# Patient Record
Sex: Male | Born: 1945 | Race: Black or African American | Hispanic: No | Marital: Married | Smoking: Never smoker
Health system: Southern US, Community
[De-identification: ages and names within clinical notes are randomized; demographics above are authoritative.]

## PROBLEM LIST (undated history)

## (undated) DIAGNOSIS — Q2730 Arteriovenous malformation, site unspecified: Secondary | ICD-10-CM

## (undated) DIAGNOSIS — G919 Hydrocephalus, unspecified: Secondary | ICD-10-CM

## (undated) DIAGNOSIS — F0390 Unspecified dementia without behavioral disturbance: Secondary | ICD-10-CM

## (undated) DIAGNOSIS — I10 Essential (primary) hypertension: Secondary | ICD-10-CM

## (undated) DIAGNOSIS — I729 Aneurysm of unspecified site: Secondary | ICD-10-CM

## (undated) HISTORY — PX: VENTRICULOPERITONEAL SHUNT: SHX204

## (undated) HISTORY — DX: Arteriovenous malformation, site unspecified: Q27.30

## (undated) HISTORY — DX: Aneurysm of unspecified site: I72.9

## (undated) HISTORY — DX: Hydrocephalus, unspecified: G91.9

## (undated) HISTORY — DX: Unspecified dementia, unspecified severity, without behavioral disturbance, psychotic disturbance, mood disturbance, and anxiety: F03.90

## (undated) HISTORY — DX: Essential (primary) hypertension: I10

---

## 2004-12-22 ENCOUNTER — Inpatient Hospital Stay (HOSPITAL_COMMUNITY): Admission: EM | Admit: 2004-12-22 | Discharge: 2005-01-30 | Payer: Self-pay | Admitting: Emergency Medicine

## 2004-12-22 ENCOUNTER — Ambulatory Visit: Payer: Self-pay | Admitting: Physical Medicine & Rehabilitation

## 2004-12-31 ENCOUNTER — Ambulatory Visit: Payer: Self-pay | Admitting: Emergency Medicine

## 2005-01-30 ENCOUNTER — Ambulatory Visit: Payer: Self-pay | Admitting: Physical Medicine & Rehabilitation

## 2005-01-30 ENCOUNTER — Inpatient Hospital Stay (HOSPITAL_COMMUNITY)
Admission: RE | Admit: 2005-01-30 | Discharge: 2005-02-08 | Payer: Self-pay | Admitting: Physical Medicine & Rehabilitation

## 2005-02-20 ENCOUNTER — Encounter
Admission: RE | Admit: 2005-02-20 | Discharge: 2005-04-17 | Payer: Self-pay | Admitting: Physical Medicine & Rehabilitation

## 2005-02-22 ENCOUNTER — Ambulatory Visit (HOSPITAL_COMMUNITY): Admission: RE | Admit: 2005-02-22 | Discharge: 2005-02-22 | Payer: Self-pay | Admitting: Neurosurgery

## 2005-03-25 ENCOUNTER — Ambulatory Visit: Payer: Self-pay | Admitting: Physical Medicine & Rehabilitation

## 2005-03-25 ENCOUNTER — Encounter
Admission: RE | Admit: 2005-03-25 | Discharge: 2005-06-23 | Payer: Self-pay | Admitting: Physical Medicine & Rehabilitation

## 2006-07-29 ENCOUNTER — Emergency Department (HOSPITAL_COMMUNITY): Admission: EM | Admit: 2006-07-29 | Discharge: 2006-07-29 | Payer: Self-pay | Admitting: Emergency Medicine

## 2006-12-19 ENCOUNTER — Emergency Department (HOSPITAL_COMMUNITY): Admission: EM | Admit: 2006-12-19 | Discharge: 2006-12-19 | Payer: Self-pay | Admitting: Emergency Medicine

## 2007-01-01 ENCOUNTER — Ambulatory Visit (HOSPITAL_COMMUNITY): Admission: RE | Admit: 2007-01-01 | Discharge: 2007-01-01 | Payer: Self-pay | Admitting: Gastroenterology

## 2007-06-16 IMAGING — CT CT HEAD W/O CM
1 of 2 series · 13 of 30 positions shown, 17 images · IV contrast (agent unspecified)
Comparison: None.

CLINICAL DATA: Headache.  Episodes of unresponsiveness. 
 HEAD CT WITHOUT CONTRAST:
TECHNIQUE: Contiguous axial images were obtained from the base of the skull through the vertex according to standard protocol without contrast.

[Series 2: brain · axial · 0.47mm/px · z∈[+147,+268]mm · 13 of 28 slices shown, 17 images]
[im 2/28  brain]
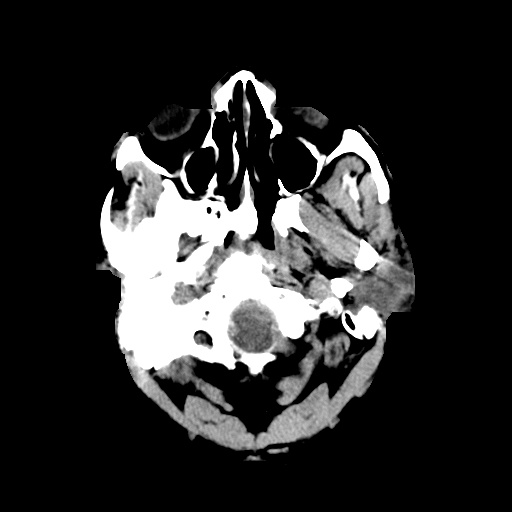
[im 2/28  bone]
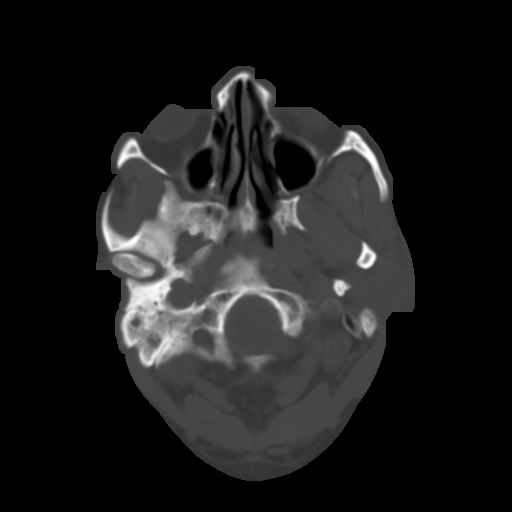
[im 4/28  brain]
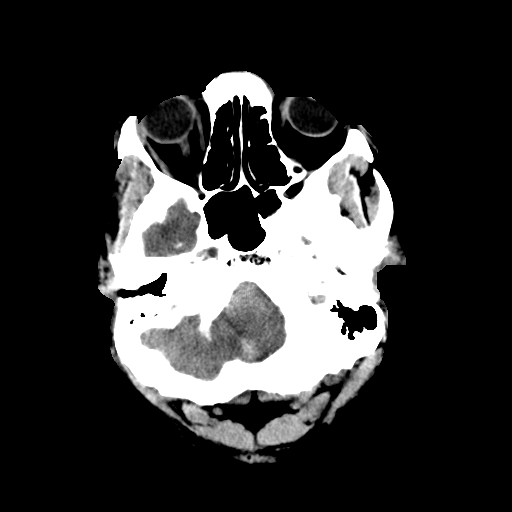
[im 6/28  brain]
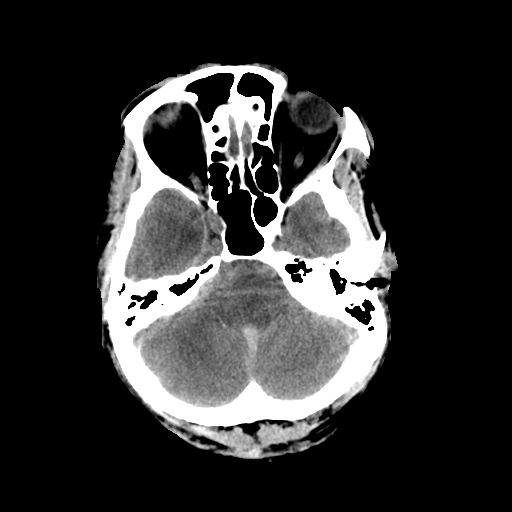
[im 8/28  brain]
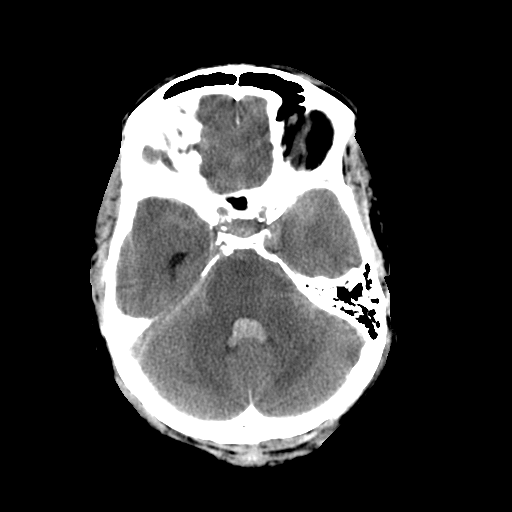
[im 10/28  brain]
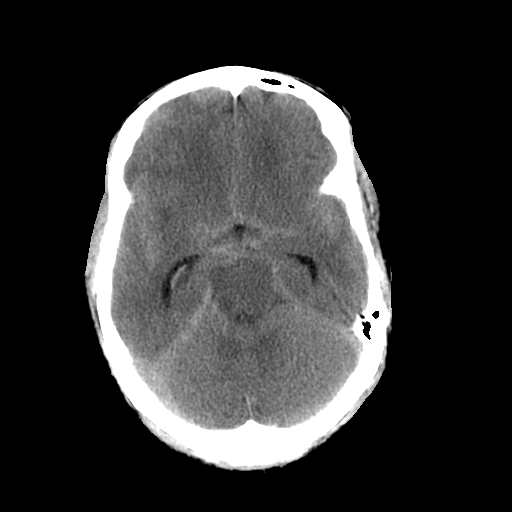
[im 10/28  bone]
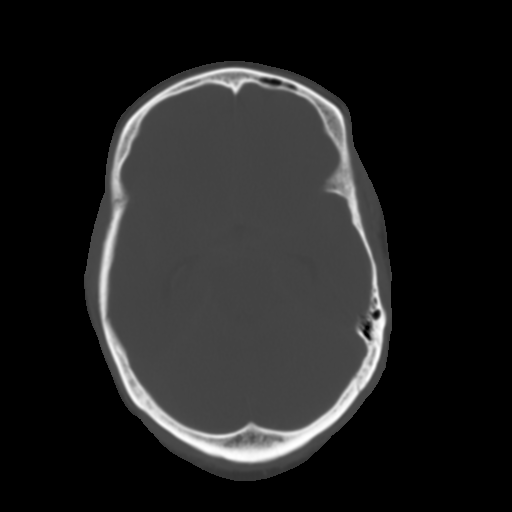
[im 12/28  brain]
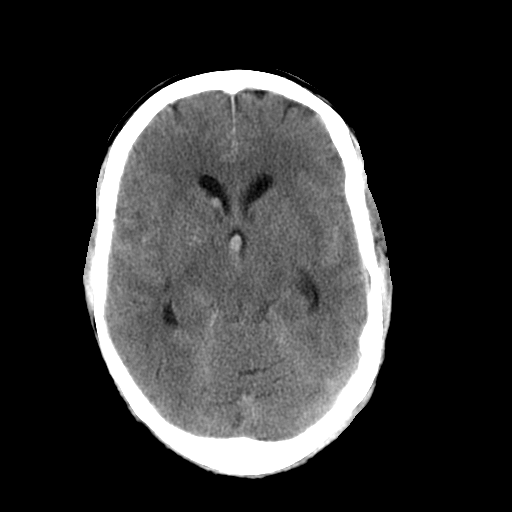
[im 14/28  brain]
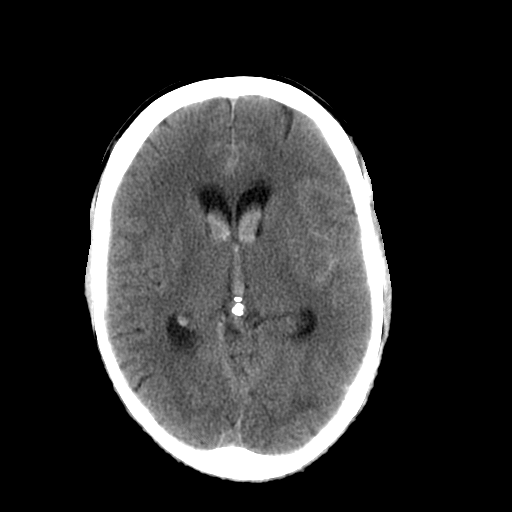
[im 16/28  brain]
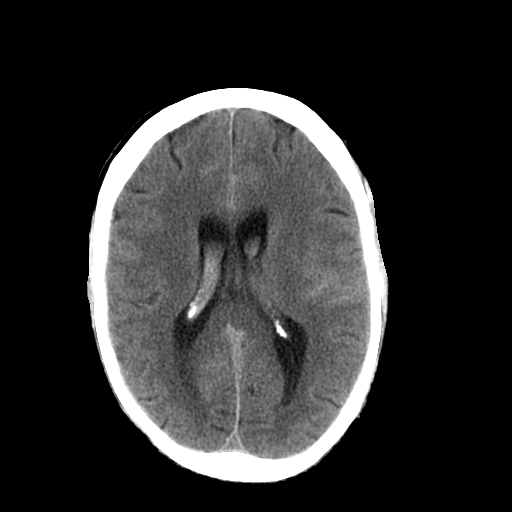
[im 18/28  brain]
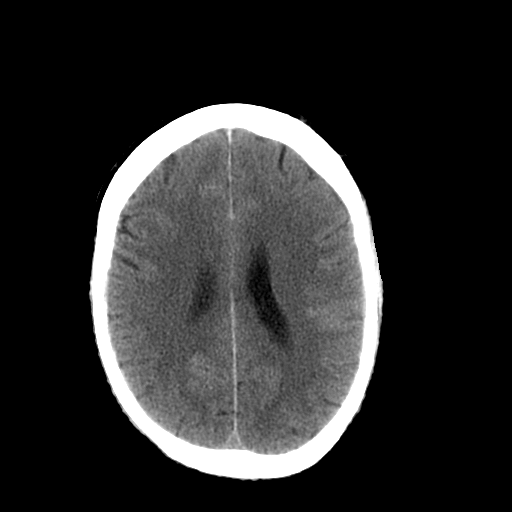
[im 18/28  bone]
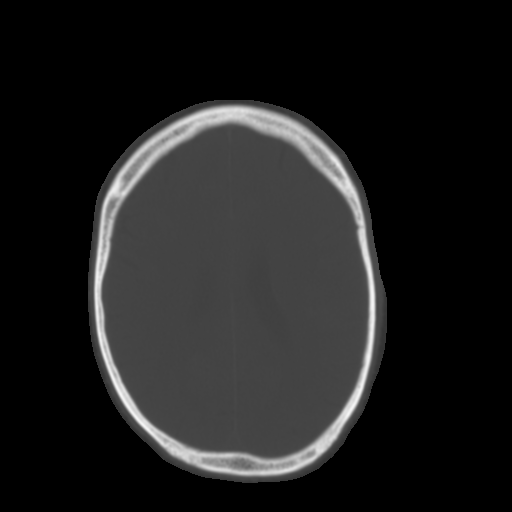
[im 20/28  brain]
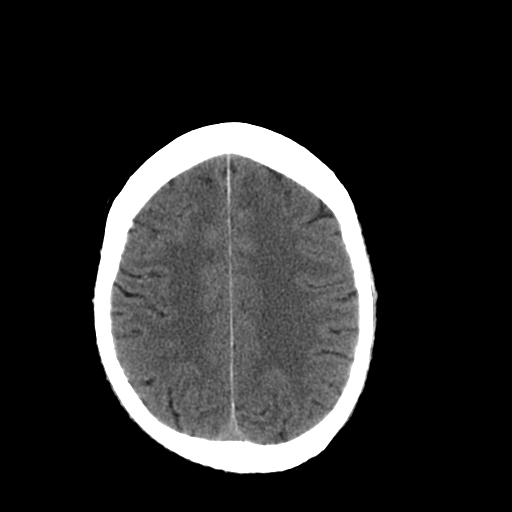
[im 22/28  brain]
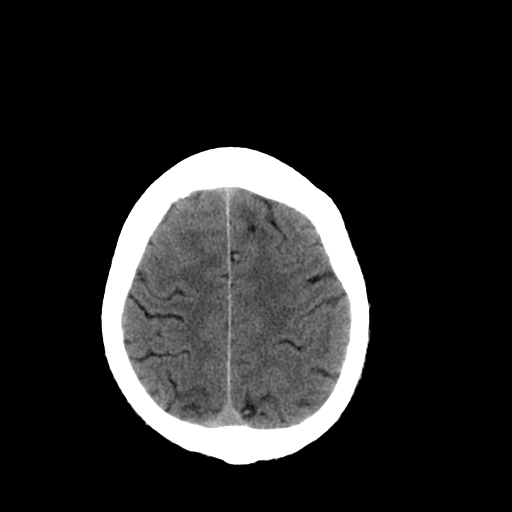
[im 24/28  brain]
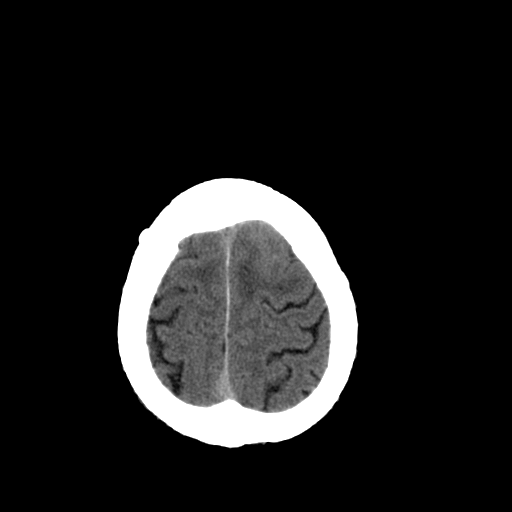
[im 26/28  brain]
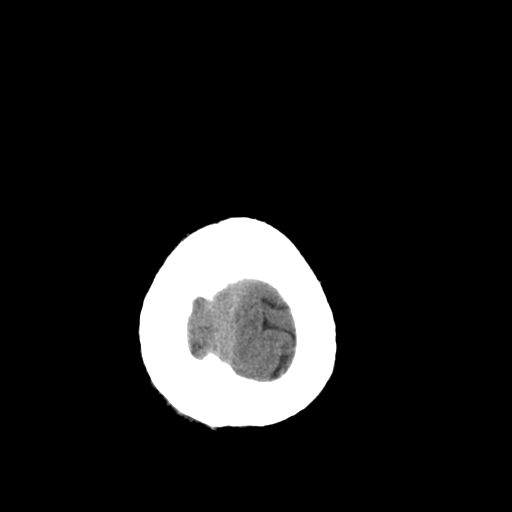
[im 26/28  bone]
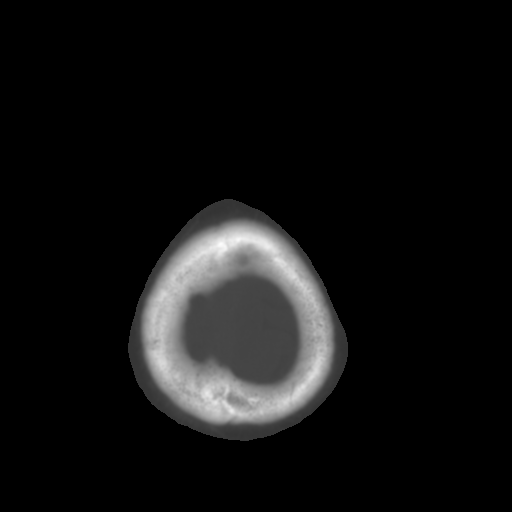

[13 of 30 positions shown; findings below may reference images not displayed]

FINDINGS: There is evidence of acute diffuse subarachnoid and intraventricular hemorrhage.  Subarachnoid blood is very slightly more prominent on the left compared to the right.  A large amount of focal blood is present in a distended fourth ventricle and there likely are early signs of developing obstructive hydrocephalus.  In addition mass effect is noted due to diffuse cerebral edema with obliteration of the suprasellar cistern and CSF spaces around the brainstem.
IMPRESSION: Acute subarachnoid hemorrhage with associated intraventricular blood and likely developing obstructive hydrocephalus.  There is associated cerebral edema and mass effect with obliteration of CSF spaces at the level of the suprasellar cistern and surrounding the brainstem.  Findings were discussed directly with Dr. Mykolas-Genius in the emergency department.

## 2007-06-17 IMAGING — CT CT HEAD W/O CM
1 of 2 series · 13 of 30 positions shown, 17 images · non-contrast
Comparison: 12/22/2004

CLINICAL DATA: Subarachnoid hemorrhage

HEAD CT WITHOUT CONTRAST
TECHNIQUE: 5mm collimated images were obtained from the base of the skull
through the vertex according to standard protocol without contrast.

[Series 2: brain · axial · 0.47mm/px · z∈[+107,+228]mm · 13 of 28 slices shown, 17 images]
[im 2/28  brain]
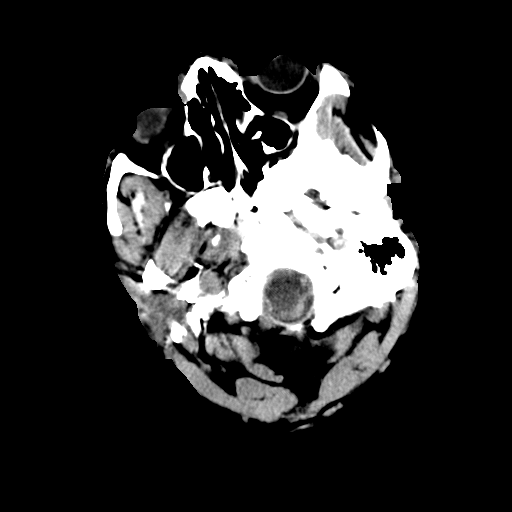
[im 2/28  bone]
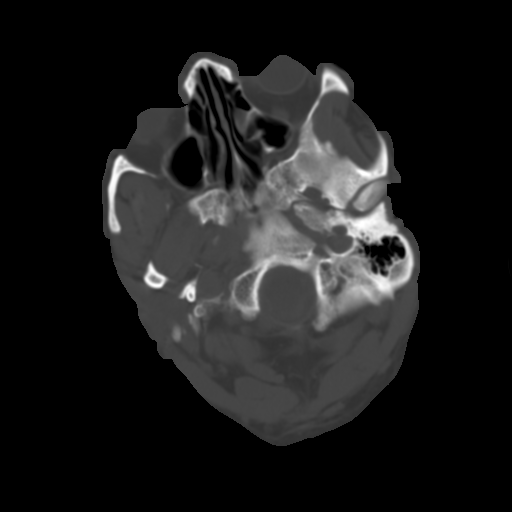
[im 4/28  brain]
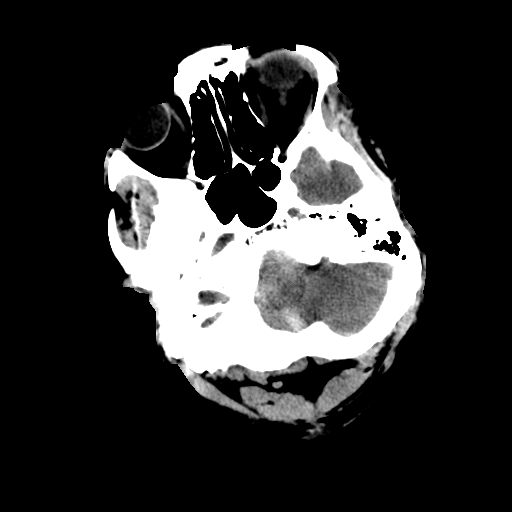
[im 6/28  brain]
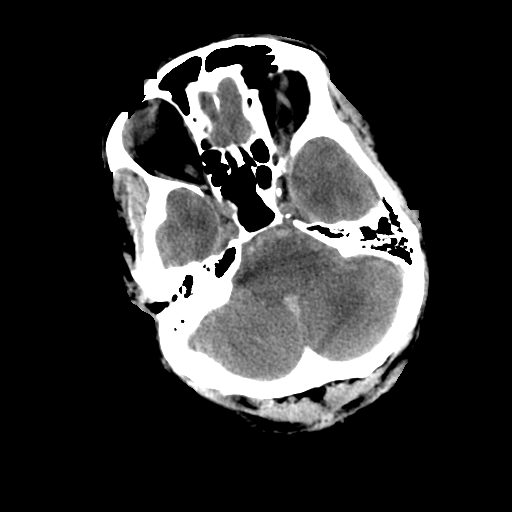
[im 8/28  brain]
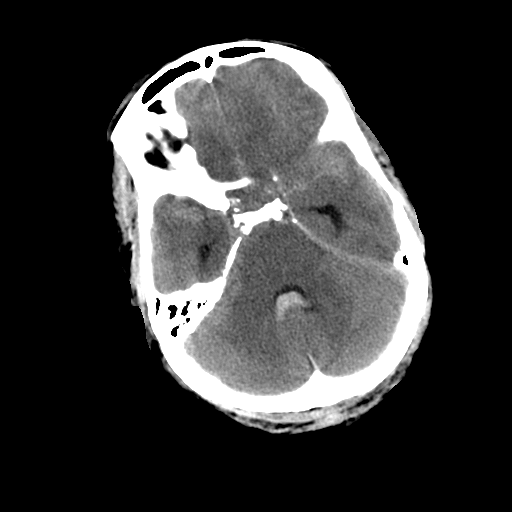
[im 10/28  brain]
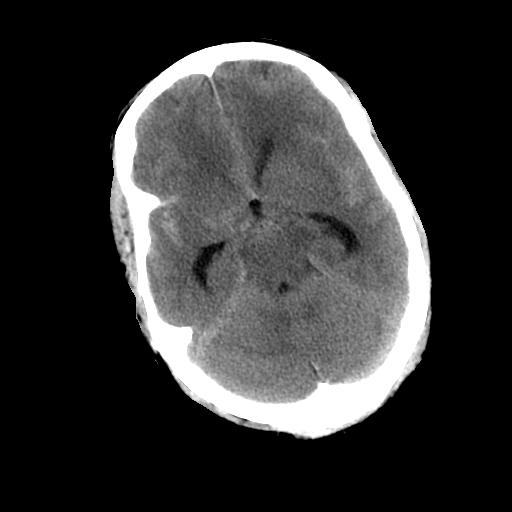
[im 10/28  bone]
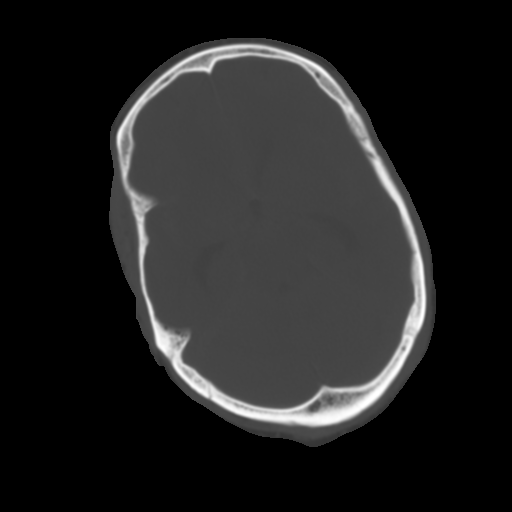
[im 12/28  brain]
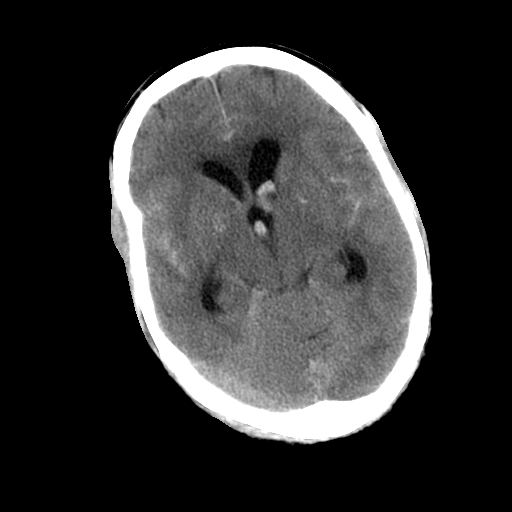
[im 14/28  brain]
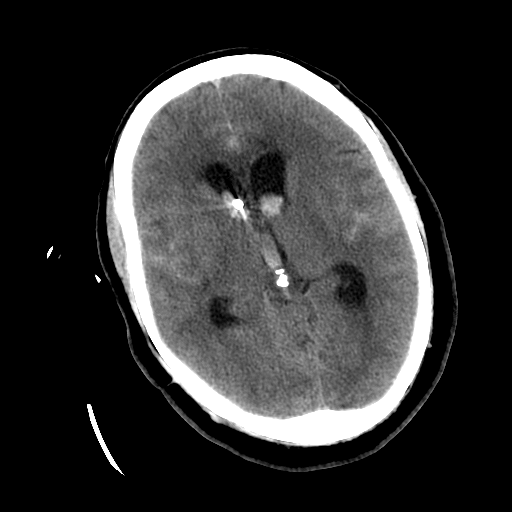
[im 16/28  brain]
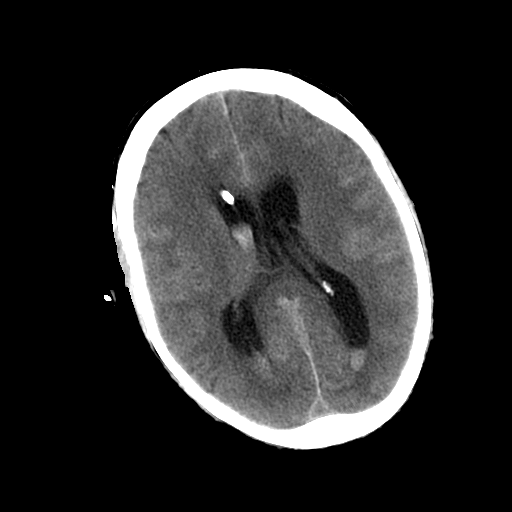
[im 18/28  brain]
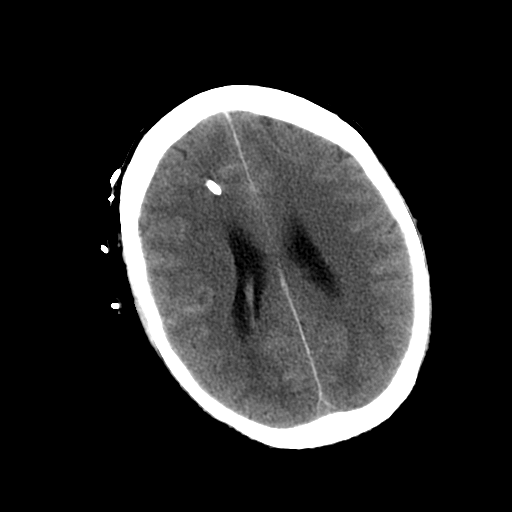
[im 18/28  bone]
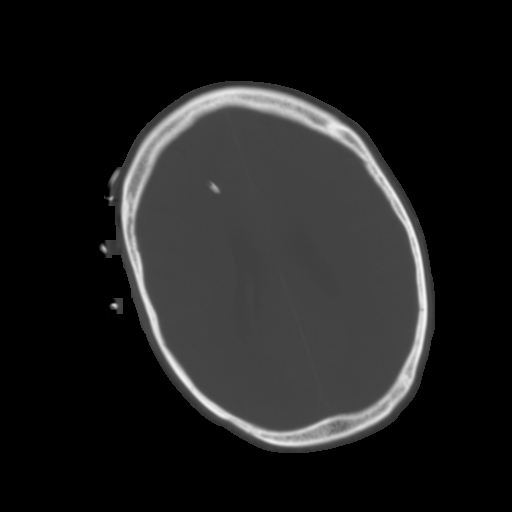
[im 20/28  brain]
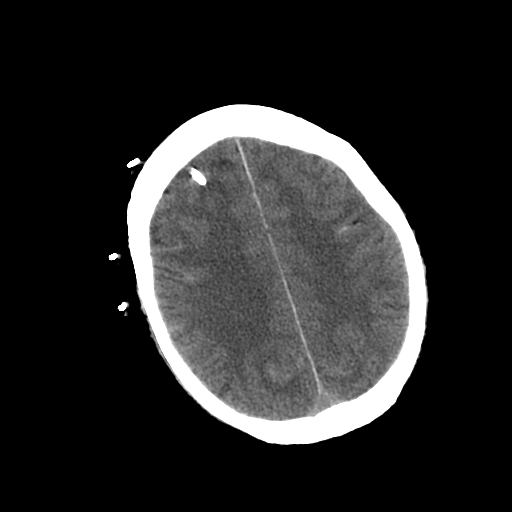
[im 22/28  brain]
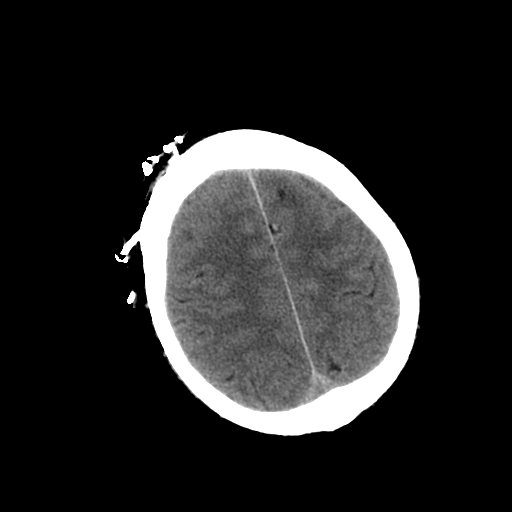
[im 24/28  brain]
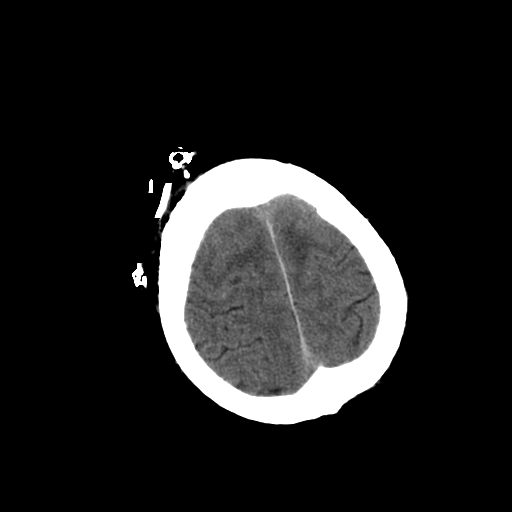
[im 26/28  brain]
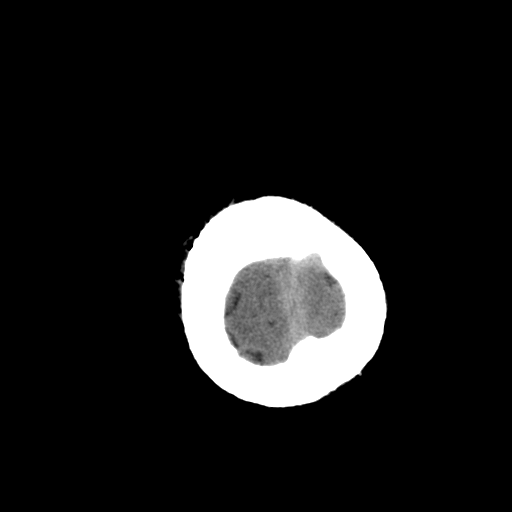
[im 26/28  bone]
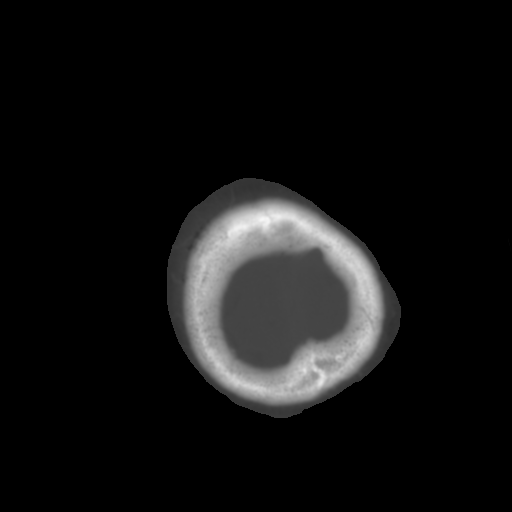

[13 of 30 positions shown; findings below may reference images not displayed]

FINDINGS: Again noted is diffuse subarachnoid hemorrhage and intraventricular
blood. Again, effacement of the basilar cisterns noted. Right frontal
ventricular drain has been placed. Ventricular size is stable to slightly
increased since prior study.

IMPRESSION

Stable diffuse subarachnoid blood and intraventricular blood.

Status post ventricular drain placement. Stable to slight increase in size of
the ventricles. Continued effacement of the basilar cisterns.

## 2007-06-24 IMAGING — CR DG CHEST 1V PORT
1 series · 1 of 1 positions shown · non-contrast
Comparison: 12/30/04.

CLINICAL DATA: 59-year-old with subconjunctival hemorrhage, nontraumatic. Follow-up changes. 
 PORTABLE CHEST - 1 VIEW:

[view not recorded]
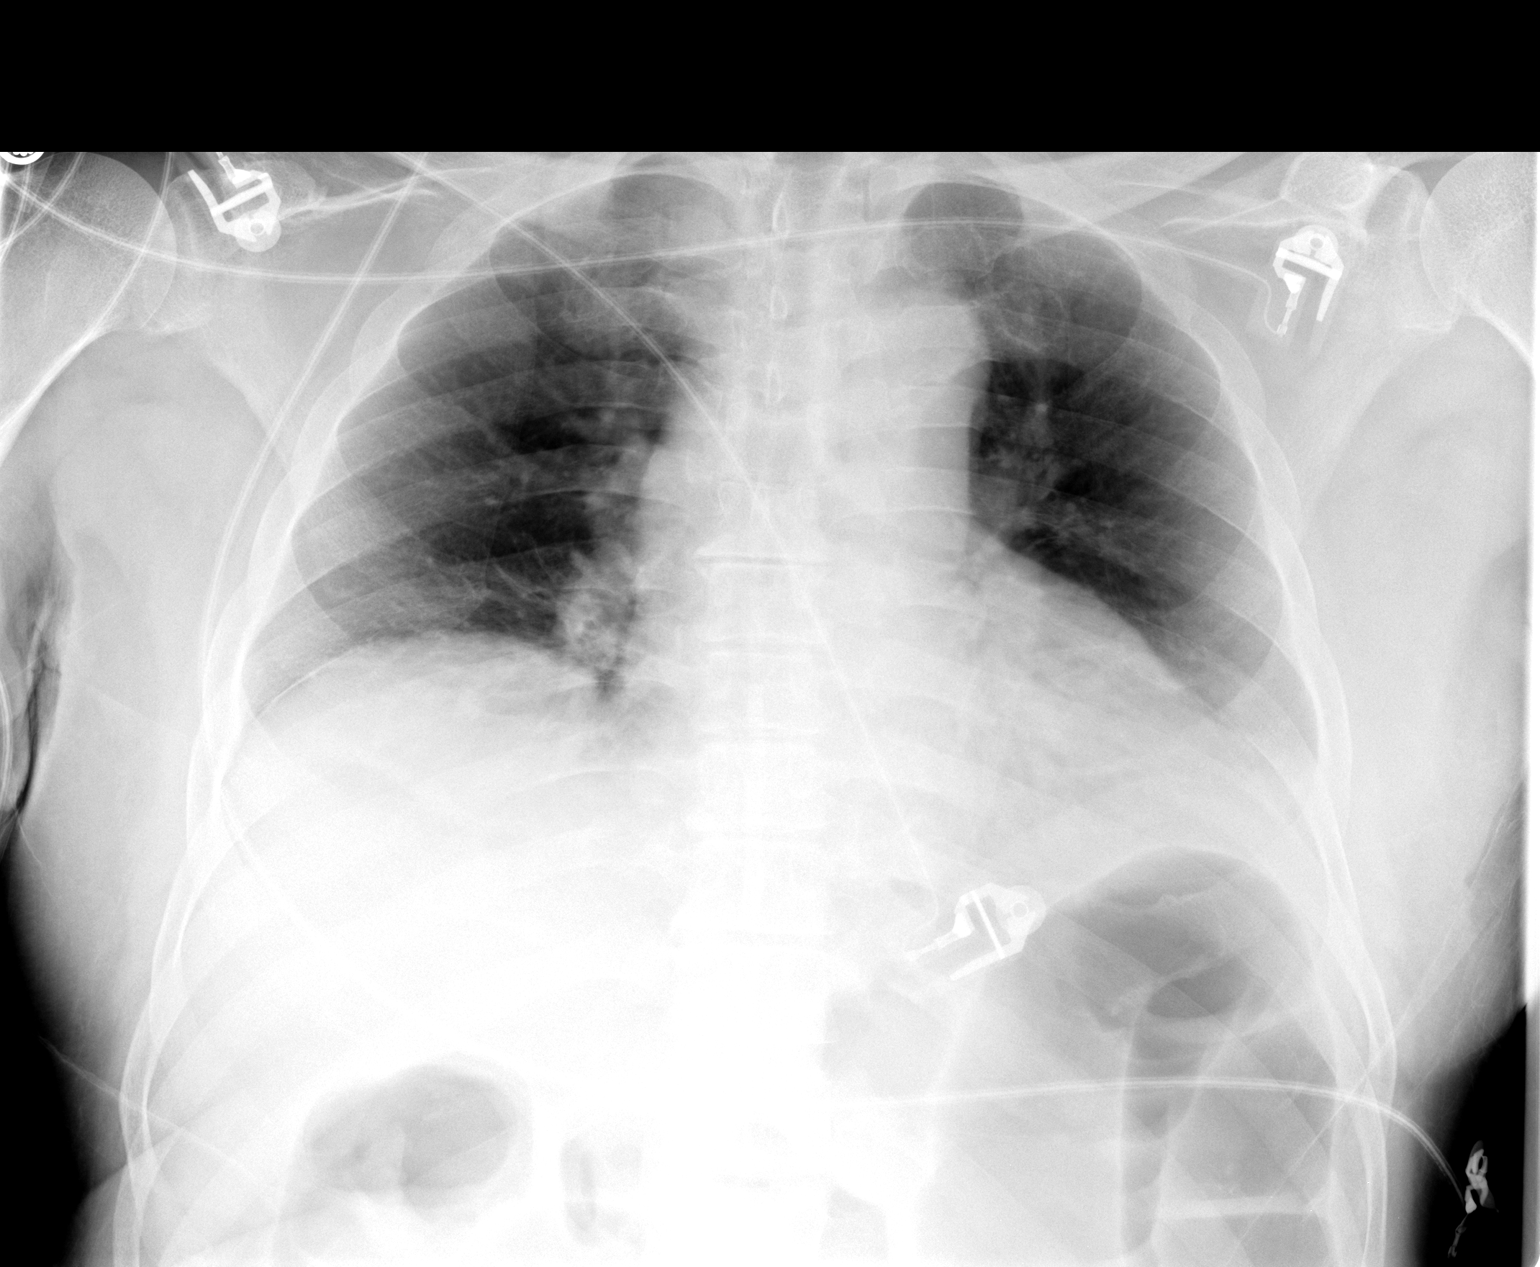

[1 of 1 positions shown; findings below may reference images not displayed]

FINDINGS: Film is made with shallow lung inflation.  However, there has been improvement in aeration of the left lower lobe.  No evidence for pleural effusion on the current study.  The heart is enlarged, but there is no pulmonary edema.
IMPRESSION: Improving aeration left lower lobe.

## 2010-02-21 ENCOUNTER — Encounter (INDEPENDENT_AMBULATORY_CARE_PROVIDER_SITE_OTHER): Payer: Self-pay | Admitting: Internal Medicine

## 2010-02-21 ENCOUNTER — Other Ambulatory Visit: Payer: Self-pay | Admitting: Internal Medicine

## 2010-02-21 ENCOUNTER — Observation Stay (HOSPITAL_COMMUNITY)
Admission: EM | Admit: 2010-02-21 | Discharge: 2010-02-22 | Payer: Self-pay | Source: Home / Self Care | Attending: Internal Medicine | Admitting: Internal Medicine

## 2010-02-21 LAB — TROPONIN I
Troponin I: 0.01 ng/mL (ref 0.00–0.06)
Troponin I: 0.01 ng/mL (ref 0.00–0.06)

## 2010-02-21 LAB — CBC
HCT: 43.4 % (ref 39.0–52.0)
Hemoglobin: 13.8 g/dL (ref 13.0–17.0)
MCH: 27.3 pg (ref 26.0–34.0)
MCHC: 31.8 g/dL (ref 30.0–36.0)
MCV: 85.9 fL (ref 78.0–100.0)
Platelets: 162 10*3/uL (ref 150–400)
RBC: 5.05 MIL/uL (ref 4.22–5.81)
RDW: 13.6 % (ref 11.5–15.5)
WBC: 5 10*3/uL (ref 4.0–10.5)

## 2010-02-21 LAB — URINALYSIS, ROUTINE W REFLEX MICROSCOPIC
Bilirubin Urine: NEGATIVE
Hemoglobin, Urine: NEGATIVE
Ketones, ur: NEGATIVE mg/dL
Nitrite: NEGATIVE
Protein, ur: NEGATIVE mg/dL
Specific Gravity, Urine: 1.015 (ref 1.005–1.030)
Urine Glucose, Fasting: NEGATIVE mg/dL
Urobilinogen, UA: 0.2 mg/dL (ref 0.0–1.0)
pH: 6 (ref 5.0–8.0)

## 2010-02-21 LAB — POCT I-STAT, CHEM 8
BUN: 15 mg/dL (ref 6–23)
Calcium, Ion: 1.12 mmol/L (ref 1.12–1.32)
Chloride: 105 mEq/L (ref 96–112)
Creatinine, Ser: 1.3 mg/dL (ref 0.4–1.5)
Glucose, Bld: 113 mg/dL — ABNORMAL HIGH (ref 70–99)
HCT: 45 % (ref 39.0–52.0)
Hemoglobin: 15.3 g/dL (ref 13.0–17.0)
Potassium: 4.6 mEq/L (ref 3.5–5.1)
Sodium: 141 mEq/L (ref 135–145)
TCO2: 30 mmol/L (ref 0–100)

## 2010-02-21 LAB — GLUCOSE, CAPILLARY: Glucose-Capillary: 89 mg/dL (ref 70–99)

## 2010-02-21 LAB — DIFFERENTIAL
Basophils Absolute: 0 10*3/uL (ref 0.0–0.1)
Basophils Relative: 0 % (ref 0–1)
Eosinophils Absolute: 0 10*3/uL (ref 0.0–0.7)
Eosinophils Relative: 1 % (ref 0–5)
Lymphocytes Relative: 27 % (ref 12–46)
Lymphs Abs: 1.4 10*3/uL (ref 0.7–4.0)
Monocytes Absolute: 0.5 10*3/uL (ref 0.1–1.0)
Monocytes Relative: 10 % (ref 3–12)
Neutro Abs: 3.1 10*3/uL (ref 1.7–7.7)
Neutrophils Relative %: 62 % (ref 43–77)

## 2010-02-21 LAB — CARDIAC PANEL(CRET KIN+CKTOT+MB+TROPI)
CK, MB: 0.9 ng/mL (ref 0.3–4.0)
Relative Index: 0.6 (ref 0.0–2.5)
Total CK: 159 U/L (ref 7–232)
Troponin I: 0.02 ng/mL (ref 0.00–0.06)

## 2010-02-21 LAB — CK TOTAL AND CKMB (NOT AT ARMC)
CK, MB: 1.3 ng/mL (ref 0.3–4.0)
CK, MB: 1.1 ng/mL (ref 0.3–4.0)
Relative Index: 0.7 (ref 0.0–2.5)
Relative Index: 0.6 (ref 0.0–2.5)
Total CK: 184 U/L (ref 7–232)
Total CK: 178 U/L (ref 7–232)

## 2010-02-21 LAB — POCT CARDIAC MARKERS
CKMB, poc: <1 ng/mL — ABNORMAL LOW (ref 1.0–8.0)
Myoglobin, poc: 139 ng/mL (ref 12–200)
Troponin i, poc: <0.05 ng/mL (ref 0.00–0.09)

## 2010-02-22 LAB — BASIC METABOLIC PANEL
BUN: 14 mg/dL (ref 6–23)
CO2: 31 mEq/L (ref 19–32)
Calcium: 9.4 mg/dL (ref 8.4–10.5)
Chloride: 106 mEq/L (ref 96–112)
Creatinine, Ser: 1.15 mg/dL (ref 0.4–1.5)
GFR calc Af Amer: >60 mL/min (ref >60)
GFR calc non Af Amer: >60 mL/min (ref >60)
Glucose, Bld: 94 mg/dL (ref 70–99)
Potassium: 4.6 mEq/L (ref 3.5–5.1)
Sodium: 142 mEq/L (ref 135–145)

## 2010-02-22 LAB — LIPID PANEL
Cholesterol: 248 mg/dL — ABNORMAL HIGH (ref 0–200)
HDL: 61 mg/dL (ref >39)
LDL Cholesterol: 173 mg/dL — ABNORMAL HIGH (ref 0–99)
Total CHOL/HDL Ratio: 4.1 RATIO
Triglycerides: 70 mg/dL (ref <150)
VLDL: 14 mg/dL (ref 0–40)

## 2010-03-15 LAB — URINE CULTURE
Colony Count: NO GROWTH
Culture  Setup Time: 201201040856
Culture: NO GROWTH

## 2010-07-06 NOTE — Procedures (Signed)
EEG NUMBER:  [id number].   This is a 65 year old who has been hospitalized for the last month with  hydrocephalus and intracranial hemorrhage. He had a spell in the intensive  care unit of unresponsiveness, questions seizure. This a portable 17-channel  EEG with 1-channel devoted to EKG utilizing the [employer] 10/20 lead  placement system. The patient was described as being awake and asleep  clinically. Electrographically, he was asleep for about 80% of the study and  towards the very end woke up. While awake, the background consists of an  admixture of beta and theta activity with a little alpha seen.  The sleep  architecture was fairly normal with beta sleep spindles and some delta  activity without any clear interhemispheric asymmetry. While awake, no  interhemispheric asymmetry is identified either. No definite epileptiform  discharges were seen. Activation procedures were not performed. The EKG  monitor reveals a relatively regular rhythm a rate of 84 beats per minute.   CONCLUSION:  Abnormal EEG due to some mild slowing during the waking state.  However, the bulk of the recording, at least 80%, was fairly normal-  appearing sleep architecture. Clinical and imaging correlation is  recommended. No seizure activity was seen.      Catherine A. Orlin Hilding, M.D.  Electronically Signed     BMW:UXLK  D:  01/28/2005 18:57:48  T:  01/29/2005 11:38:06  Job #:  [id number]

## 2010-07-06 NOTE — Assessment & Plan Note (Signed)
MEDICAL RECORD NUMBER/>  [id number]   Jeffrey Huerta is back regarding his subarachnoid hemorrhage. He was discharged from  rehab on December 22. He has been home with his wife. He has been active and  independent at the household level. He still notes some problems with memory  and the wife states he is bit slow to process and organize at times. The  patient is retired and has no immediate plans to return to work. He would  like to drive. He does report some minimal pain in the shoulders with  overhead activities but otherwise denies any pain. He has had no problems  with balance. Appetite has been good. He has been sleeping well. Mood has  been good.   CURRENT MEDICATIONS:  1.  Enalapril 10 mg a day.  2.  Labetalol 200 mg daily.  3.  Urecholine 10 mg t.i.d.  4.  Protonix 40 mg a day.  5.  Flomax 0.4 mg q.h.s.   REVIEW OF SYSTEMS:  The patient reports no issues with his urinary function.  Other pertinent positives listed above. Full review is in the health and  history section of the chart.   SOCIAL HISTORY:  The patient lives with his wife, who is supportive.   PHYSICAL EXAMINATION:  Blood pressure is 147/80, pulse is 78, respiratory  rate is 16, saturating 98% in room air. The patient is pleasant, no acute  distress. He is alert and oriented x3. He needs some extra time for date and  place. He can recall recent events with extra time and cues. He seemed to  organize his thoughts well. He was able to follow multiple-step commands.  Cranial nerve exam was intact. Sensation was normal. Reflexes were 2+.  Coordination was good overall. Motor function was 5/5. Heart was regular  rate and rhythm, lungs were clear, abdomen soft and nontender. Mood was  pleasant.   ASSESSMENT:  1.  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  2.  Hypertension.   PLAN:  1.  The patient is doing very well at this point. I will continue with  exercise around the home. I asked the wife to work on his cognition with      daily exercises provided by speech therapy. He is to continue with      outpatient speech therapy to completion. Since there are no vocational      issues I will not schedule follow-up with me. He has done quite well to      this point. He will see surgery at [facility] regarding clipping      of his AVM.  2.  We can discontinue Urecholine and Flomax after a taper. He also may stop      his Protonix today.  3.  The patient was advised to continue with labetalol and enalapril for      blood pressure control. He should follow up with his family doctor for      ongoing management of his blood pressure. I did refill the blood      pressure      medications today.  4.  I will see the patient back on a p.r.n. basis.      Ranelle Oyster, M.D.  Electronically Signed     ZTS/MedQ  D:  03/25/2005 10:59:43  T:  03/25/2005 11:56:34  Job #:  [id number]   cc:   Fleet Contras, M.D.  Fax: [phone]

## 2010-07-06 NOTE — Discharge Summary (Signed)
NAME:  Jeffrey Huerta, Jeffrey Huerta NO.:  [id number]   MEDICAL RECORD NO.:  [id number]          PATIENT TYPE:  IPS   LOCATION:  4099                         FACILITY:  [facility]   PHYSICIAN:  Ranelle Oyster, M.D.DATE OF BIRTH:  1945-09-01   DATE OF ADMISSION:  01/30/2005  DATE OF DISCHARGE:  02/08/2005                                 DISCHARGE SUMMARY   DISCHARGE DIAGNOSES:  1.  Subarachnoid hemorrhage with obstructive hydrocephalus - AVM status post      right frontal ventriculostomy.  2.  Dysphagia.  3.  VDRF - resolved.  4.  Diarrhea - resolved.  5.  History of gout, hypertension, urinary retention.   HISTORY OF PRESENT ILLNESS:  This is a 65 year old male unremarkable past  history who was admitted 11/04 with sudden loss of consciousness noted  nausea and vomiting.  Crown CT scan with acute subarachnoid hemorrhage with  associated intraventricular blood and developing obstructive hydrocephalus.  Cerebral arteriogram 11/04 with high flow AVM posterior aspect, left  cerebellar hemisphere.  Neurosurgery Dr. Phoebe Perch underwent right frontal  ventriculostomy.   HOSPITAL COURSE:  Fever.  Placed on broad spectrum antibiotics.  VDRF  critical care medicine follow up.  He was extubated over time.  Follow-up  crown CT scan 12/05 stable.  Swallow study 12/08 placed on a mechanical soft  diet.  Bouts of diarrhea secondary to Panda tube feeds.  Noted urinary  retention, intermittent catheterizations every 6-8 hours with small voids.  Maintain on Flomax.  Need a waist belt and place for safety as patient was  impulsive.  Blood pressures monitored with Vasotec and labetalol.  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  Advise follow-up crown CT scan in one week.  Will need AVM addressed in the future, bouts of hypokalemia and follow up  labs.  Admitted for comprehensive rehab program.   PAST MEDICAL HISTORY:  See discharge diagnoses.   ALLERGIES:  None.   MEDICATIONS:  Prior to  admission none.   SOCIAL HISTORY:  Lives with his wife.  Wife works day shift.  Family can  provide 24/7 assistance.   REHABILITATION HOSPITAL COURSE:  Subarachnoid hemorrhage with obstructive  hydrocephalus, AVM.  He had underwent right frontal ventriculostomy.  He  would follow with Dr. Phoebe Perch, neurosurgery for consideration and repair of  AVM in the future with repeat MRI study.  His diet has thus been advanced to  regular and he was tolerating well.  He exhibited no signs of respiratory  distress.  Oxygen saturation greater than 90% on room air.  Blood pressures  monitored with diastolic's 84, 86 as he continued with his labetalol and  Vasotec.  He did have small intermittent voids.  He was still needing  intermittent catheterization at times. He was on Flomax 0.4 mg at bedtime,  Urecholine was added at a low dose 10 mg 3 x daily on 12/20 being monitored.  His latest scan was 160 ml.  Functionally he continued to progress in all  areas.  He was simple set up for activities of daily living needing standby  assistance  for the ambulation due to some decreased awareness. He had been  seen by Dr. Francene Castle of neuro psychology to address these issues.  This patient remained calm, pleasant cooperative. No overt signs of  emotional distress. His expression and receptive language was intact for  basic information, though processes, verbal with some perseveration to  verbal ideas.  He still needed cues to locate his room.  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   LATEST LABS:  Hemoglobin of 10.7, hematocrit 31.5, platelets 265,000, sodium  138, potassium 3.7, BUN 9, creatinine 0.9.   DISCHARGE MEDICATIONS:  At the time of dictation included:  1.  Urecholine 10 mg 3 x daily.  2.  Protonix 40 mg daily.  3.  Flomax 0.4 mg at bedtime.  4.  Labetalol 200 mg daily.  5.   Vasotec 10 mg daily.   ACTIVITY:  Assistance for safety.   DIET:  Regular.   FOLLOW UP:  The patient would follow up with Dr. Ranelle Oyster at the  Outpatient Rehab [facility] office.  Appointment to be made as well as with Dr.  Phoebe Perch, neurosurgery [phone] to address AVM in the future.      Mariam Dollar, P.A.      Ranelle Oyster, M.D.  Electronically Signed    DA/MEDQ  D:  02/07/2005  T:  02/09/2005  Job:  [id number]   cc:   Ranelle Oyster, M.D.  Fax: [phone]   Clydene Fake, M.D.  Fax: [phone]

## 2010-07-06 NOTE — Discharge Summary (Signed)
NAME:  Jeffrey Huerta, Jeffrey Huerta NO.:  [id number]   MEDICAL RECORD NO.:  [id number]           PATIENT TYPE:   LOCATION:                                 FACILITY:   PHYSICIAN:  Clydene Fake, M.D.       DATE OF BIRTH:   DATE OF ADMISSION:  12/22/2004  DATE OF DISCHARGE:  01/30/2005                                 DISCHARGE SUMMARY   DIAGNOSIS:  Subarachnoid hemorrhage with arteriovenous malformation and  aneurysms.   DISCHARGE DIAGNOSIS:  Subarachnoid hemorrhage with arteriovenous  malformations and aneurysms.   PROCEDURES:  Ventriculostomy, angiogram.   REASON FOR ADMISSION:  This is a 65 year old with some loss of  consciousness, was brought to the emergency room.  Was more arousable,  complained of headache, some nausea and vomiting.  A CT scan was done  showing subarachnoid hemorrhage with intraventricular hemorrhage and maybe  some early hydrocephalus.  The patient was admitted to ICU, set up for  cerebral angiogram.   HOSPITAL COURSE:  The patient was admitted to ICU.  Angiogram was done  showing an AVM left cerebellar hemisphere with associated feeding vessels,  aneurysms.  It is the cerebellar hemisphere and it is a vermis AVM, proximal  feeding vessels, aneurysms x4, with the PICA, PCA, and superior cerebellar  being feeding vessels.  On December 22, 2005, at 2:45 the patient had some  decreased level of consciousness and after discussing with the wife and  getting consent, a right frontal ventriculostomy was placed.  That helped  the patient, was arousable, and the next morning he was awake, alert,  oriented x2, was able to follow commands, no drift.  The patient continued  having ICU care, easily arousable, oriented x2, was able to follow commands,  move all four extremities, SV drainage continued.  We followed the TCDs  which remained all right.  He started having some respiratory distress and  pulmonary critical care was consulted for some concurrent care  with  increased pulmonary toilet, checked cultures and changed antibiotics.  We  tried to wean the ventriculostomy by increasing his height.  The patient did  not tolerate that well, the ventricle seemed to get a little bit bigger, and  we decreased it again, letting it drain well.  The patient did become a  little more lethargic and ended up with a decreased level of consciousness  and more pulmonary distress.  The patient was intubated on January 02, 2005.  It appeared his mental status change was due to fever, probably due  to pneumonia or sepsis.  Critical care was continued with the current care  with [place] through this.  By November 20 he was still draining CSF through his  ventriculostomy.  He was able to be aroused, follow commands, and was trying  to mouth words.  CT showed the blood was clearing.  The ventricles were  still slightly big but cisterns open, and we started trying to wean his  ventriculostomy again.  The patient was extubated.  We started some  swallowing assessments.  He passed a bedside  swallowing evaluation on  November 27.  We continued weaning the ventriculostomy.  On November 30 he  only had 70 mL out over 24 hours up to 12 cm in his ventriculostomy.  Was  more awake, following commands, oriented only x1 though.  CT showed only  slightly increased ventricles.  We continued our current course at this  point and worked on nutrition.  We had clamped the ventriculostomy, repeated  CT on December 5.  Repeat CT showed no change from when the ventriculostomy  was open, and the ventriculostomy was discontinued.  We worked on increasing  activity and got a full PT/OT/speech therapy consult, and started getting  him out of bed and moving.  Repeat CT on December 7 without the  ventriculostomy showed the ventricles were slightly diminished, there was  more fluid __________.  He was more alert and doing well and we transferred  him to the ACU at this point and rehab services  were consulted.  He  continued making some slow  progress and January 30, 2005, the patient was transferred to rehab  services for further care and we will follow the patient from there as an  outpatient.  He will need treatment for his AVM and the aneurysms down the  road, which we will set up as an outpatient.           ______________________________  Clydene Fake, M.D.     JRH/MEDQ  D:  06/25/2005  T:  06/26/2005  Job:  [id number]

## 2010-07-06 NOTE — H&P (Signed)
NAME:  Jeffrey Huerta, Jeffrey Huerta NO.:  [id number]   MEDICAL RECORD NO.:  [id number]          PATIENT TYPE:  IPS   LOCATION:  4027                         FACILITY:  [facility]   PHYSICIAN:  Ranelle Oyster, M.D.DATE OF BIRTH:  [birth year]   DATE OF ADMISSION:  01/30/2005  DATE OF DISCHARGE:                                HISTORY & PHYSICAL   CHIEF COMPLAINT:  Weakness and mental status changes.   This is a 65 year old African-American male admitted November 4 with sudden  loss of consciousness with associated nausea and vomiting prior. Head CT  revealed acute subarachnoid hemorrhage with intraventricular blood resulting  in obstructive hydrocephalus as well.  Further workup revealed an AVM in the  posterior aspect of the left cerebellar hemisphere.  Right frontal  ventriculostomy was placed per Dr. Phoebe Perch.  Course was complicated by fever  and respiratory failure.  The patient has continued deficits in regards to  his speech and cognition.  There are safety awareness issues as well.   The patient had a speech performed on December 8 which found the patient  should have a dysphagia 3 thin liquid diet.  Urinary retention was also  noted.  Waist belt was used for safety.  AVM has essentially be stable, but  the patient will need followup intervention in this regard.   REVIEW OF SYSTEMS:  Listed in detail in the written H&P.   PAST MEDICAL HISTORY:  Positive for gout.   FAMILY HISTORY:  Negative.   SOCIAL HISTORY:  The patient lives with his wife.  His wife works day shift.  The patient's family apparently can provide 24-hour assistance. 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   MEDICATIONS PRIOR TO ARRIVAL:  None.   ALLERGIES:  None.   LABORATORY DATA:  Hemoglobin 11.8, white count 8.5, platelets 299.  Sodium  135, potassium 4.6, BUN 16, creatinine 0.7.   PHYSICAL EXAMINATION:  VITAL SIGNS: Blood pressure  120/70, pulse 88,  respiratory rate 18, temperature 98.  GENERAL: The patient is generally pleasant, in no acute distress.  HEENT:  Pupils equal, round, and reactive to light and accommodation.  Ear,  nose, throat exams were unremarkable.  NECK: Supple without JVD, lymphadenopathy.  CHEST: Clear to auscultation bilaterally without wheezes, rales, or rhonchi.  HEART:  Regular rate and rhythm without murmurs, rubs, or gallops.  NEUROLOGIC: Cranial nerves II-XII appeared to be grossly intact.  He has  intact vision and scanning.  Reflexes were slightly increased although  unreliably and evidently so.  Sensation is grossly intact.  The patient's  judgment, orientation, and memory are difficult to assess due to his  language problems.  The patient had significant receptive and somewhat  expressive language deficits.  Confabulation is a prominent issue as well.  Motor function was 5/5.  The patient stood for me, took some steps, and had  some mild loss of truncal control but all in all was stable with hand-on  assist.  EXTREMITIES:  Showed no clubbing, cyanosis, or edema.  Calves were  nontender.   ASSESSMENT AND PLAN:  1.  Functional deficits secondary to subarachnoid hemorrhage with associated      obstructive hydrocephalus.  The patient is status post right frontal      ventriculostomy.  Intervention is planned with patient's arteriovenous      malformation in the future.  Comprehensive inpatient rehabilitation with      supervision to modified independent goals.  The patient continues to      have significant cognitive and language deficits. Prognosis is fair.  2.  Pain management with Tylenol p.r.n.  3.  Deep vein thrombosis prophylaxis with thigh-high TEDs and ambulation.  4.  Diarrhea: This has resolved.  Observe clinically.  5.  History of gout.  6.  Hypertension: Vasotec and Labetalol.  7.  Urinary retention: Check PVRs.  8.  Safety: Waist belt in chair and bed for now.       Ranelle Oyster, M.D.  Electronically Signed     ZTS/MEDQ  D:  01/30/2005  T:  01/30/2005  Job:  [id number]   cc:   Clydene Fake, M.D.  Fax: [phone]

## 2010-11-28 LAB — DIFFERENTIAL
Basophils Relative: 0
Lymphs Abs: 1.1
Monocytes Absolute: 0.6
Monocytes Relative: 9
Neutro Abs: 5.2

## 2010-11-28 LAB — POCT I-STAT CREATININE: Operator id: 270111

## 2010-11-28 LAB — POCT CARDIAC MARKERS
CKMB, poc: 1.1
Myoglobin, poc: 91.9

## 2010-11-28 LAB — I-STAT 8, (EC8 V) (CONVERTED LAB)
BUN: 13
Bicarbonate: 24.9 — ABNORMAL HIGH
Glucose, Bld: 92
TCO2: 26
pH, Ven: 7.402 — ABNORMAL HIGH

## 2010-11-28 LAB — CBC
Hemoglobin: 12.8 — ABNORMAL LOW
MCHC: 33.9
MCV: 79.2
RBC: 4.79
WBC: 6.9

## 2010-12-06 LAB — I-STAT 8, (EC8 V) (CONVERTED LAB)
Bicarbonate: 21.7
Glucose, Bld: 103 — ABNORMAL HIGH
HCT: 42
Hemoglobin: 14.3
Operator id: 279831
Potassium: 3.9
Sodium: 137
TCO2: 22

## 2010-12-06 LAB — POCT CARDIAC MARKERS
CKMB, poc: <1 — ABNORMAL LOW
Myoglobin, poc: 109

## 2010-12-06 LAB — POCT I-STAT CREATININE: Operator id: 279831

## 2014-05-25 DIAGNOSIS — E784 Other hyperlipidemia: Secondary | ICD-10-CM | POA: Diagnosis not present

## 2014-05-25 DIAGNOSIS — Z131 Encounter for screening for diabetes mellitus: Secondary | ICD-10-CM | POA: Diagnosis not present

## 2014-05-25 DIAGNOSIS — I10 Essential (primary) hypertension: Secondary | ICD-10-CM | POA: Diagnosis not present

## 2014-05-25 DIAGNOSIS — Z125 Encounter for screening for malignant neoplasm of prostate: Secondary | ICD-10-CM | POA: Diagnosis not present

## 2014-05-25 DIAGNOSIS — Z0389 Encounter for observation for other suspected diseases and conditions ruled out: Secondary | ICD-10-CM | POA: Diagnosis not present

## 2014-11-17 DIAGNOSIS — H6122 Impacted cerumen, left ear: Secondary | ICD-10-CM | POA: Diagnosis not present

## 2014-11-17 DIAGNOSIS — H6062 Unspecified chronic otitis externa, left ear: Secondary | ICD-10-CM | POA: Diagnosis not present

## 2014-11-23 DIAGNOSIS — E784 Other hyperlipidemia: Secondary | ICD-10-CM | POA: Diagnosis not present

## 2014-11-23 DIAGNOSIS — I10 Essential (primary) hypertension: Secondary | ICD-10-CM | POA: Diagnosis not present

## 2014-11-23 DIAGNOSIS — Z23 Encounter for immunization: Secondary | ICD-10-CM | POA: Diagnosis not present

## 2015-02-07 DIAGNOSIS — F0150 Vascular dementia without behavioral disturbance: Secondary | ICD-10-CM | POA: Diagnosis not present

## 2015-02-07 DIAGNOSIS — I10 Essential (primary) hypertension: Secondary | ICD-10-CM | POA: Diagnosis not present

## 2015-02-07 DIAGNOSIS — R5383 Other fatigue: Secondary | ICD-10-CM | POA: Diagnosis not present

## 2015-06-21 ENCOUNTER — Ambulatory Visit (INDEPENDENT_AMBULATORY_CARE_PROVIDER_SITE_OTHER): Payer: Medicare Other | Admitting: Neurology

## 2015-06-21 ENCOUNTER — Encounter: Payer: Self-pay | Admitting: Neurology

## 2015-06-21 VITALS — BP 141/79 | HR 63 | Ht 66.0 in | Wt 148.8 lb

## 2015-06-21 DIAGNOSIS — Q2730 Arteriovenous malformation, site unspecified: Secondary | ICD-10-CM | POA: Diagnosis not present

## 2015-06-21 DIAGNOSIS — R413 Other amnesia: Secondary | ICD-10-CM | POA: Diagnosis not present

## 2015-06-21 MED ORDER — MEMANTINE HCL 10 MG PO TABS: 10.0000 mg | ORAL_TABLET | Freq: Two times a day (BID) | ORAL | Status: DC

## 2015-06-21 MED ORDER — DONEPEZIL HCL 10 MG PO TABS: 10.0000 mg | ORAL_TABLET | Freq: Every day | ORAL | Status: DC

## 2015-06-21 NOTE — Progress Notes (Signed)
PATIENT: Jeffrey Huerta DOB: 06-04-1945  Chief Complaint  Patient presents with  . Memory Loss    MMSE 23/30 - 14 animals.  He is here with his wife, Jeffrey Huerta and his daughter, Jeffrey Huerta, to discuss his worsening memory loss.  He is currently taking donepezil 5mg  daily.  He previously had brain surgery in 2007 (hydrocephalus, AVM, and aneurysm).      HISTORICAL  Jeffrey Huerta is a 70 years old right-handed male, accompanied by his wife, and daughter Jeffrey Huerta, seen in refer by neurosurgical nurse practitioner Jeffrey Huerta for evaluation of memory loss in May third 2017, his primary care physician is Dr. Nolene Huerta and nurse practitioner Jeffrey Huerta.  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29,  He presented with acute intracranial bleeding November fifth 2006, was initially treated at [facility], I personally reviewed CAT scan of the brain without contrast in November 2006, acute subarachnoid hemorrhage with associated intraventricular blood, evidence of obstructive hydrocephalus, there is associated cerebral edema and mass effect with obliteration of CSF space at the level of superacellar cistern and surrounding brainstem,  Four-vessel angiogram in December 24 2004, 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 Duplicated right middle cerebral artery, a normal developmental radiation.  He was later referred to neurosurgeon Dr. 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 on March 28 2 707, he underwent ventriculoperitoneal shunt placement on March 32 707 for subsequent noncommunicating hydrocephalus,  He went through prolonged comprehensive rehabilitation, has to relearn walking feeding himself again, but he has made marked progress, he is able to driving, continues to drive now, he was able to take care of his household includes paying the bill, fixing the cars for a while, since 2012, he was noted to have progressive worsening memory loss, his wife has to take care of the bill now, he become much less active, he tends to sitting the porch or his car all day long, get easily agitated at evening time, he has lost appetite,  He was put on Aricept 5 mg since March 2017, tolerating it well, no significant side effect notice, he has no gait difficulty,  He was recently reevaluated by neurosurgeon clinic at [facility], I reviewed her CAT scan of the brain without contrast at [facility] in April 2017 no acute intracranial abnormality, right frontal approach ventriculoperitoneal shunt with no evidence of hydrocephalus, similar posterior fossa encephalomalacia status post left occipital craniotomy, redemonstrated right frontal lobe encephalomalacia, potentially reflecting sequelae of remote infarction  REVIEW OF SYSTEMS: Full 14 system review of systems performed and notable only for memory loss, confusion, hearing loss  ALLERGIES: No Known Allergies  HOME MEDICATIONS: Current Outpatient Prescriptions  Medication Sig Dispense Refill  . donepezil (ARICEPT) 5 MG tablet Take 5 mg by mouth.    . losartan (COZAAR) 50 MG tablet Take 50 mg by mouth.     No current facility-administered medications for this visit.    PAST MEDICAL HISTORY: Past Medical History  Diagnosis Date  . Hydrocephalus   . Aneurysm ([place])   . AVM (arteriovenous malformation)   .  Hypertension   . Memory loss     PAST SURGICAL HISTORY: Past Surgical History  Procedure Laterality Date  . Brain surgery      FAMILY HISTORY: Family History  Problem Relation Age of Onset  . Stroke Mother   . Hypertension Mother     SOCIAL HISTORY:  Social History   Social History  . Marital Status: Married    Spouse Name: N/A  . Number of Children: 3  . Years of Education: 10th   Occupational History  . Retired    Social History Main Topics  . Smoking status: Never Smoker   . Smokeless tobacco: Not on file  . Alcohol Use: No  . Drug Use: No  . Sexual Activity: Not on file   Other Topics Concern  . Not on file   Social History Narrative   Lives at home with wife.   Right-handed.   No caffeine use.     PHYSICAL EXAM   Filed Vitals:   06/21/15 1033  BP: 141/79  Pulse: 63  Height: 5\' 6"  (1.676 m)  Weight: 148 lb 12 oz (67.473 kg)    Not recorded      Body mass index is 24.02 kg/(m^2).  PHYSICAL EXAMNIATION:  Gen: NAD, conversant, well nourised, obese, well groomed                     Cardiovascular: Regular rate rhythm, no peripheral edema, warm, nontender. Eyes: Conjunctivae clear without exudates or hemorrhage Neck: Supple, no carotid bruise. Pulmonary: Clear to auscultation bilaterally   NEUROLOGICAL EXAM:  MENTAL STATUS: Speech:    Speech is normal; fluent and spontaneous with normal comprehension.  Cognition:Mini-Mental Status Examination 23/30, animal naming is 14      Orientation: Is not oriented to date, month, place,     Recent and remote memory: He missed 3 out of 3 recalls     Attention span and concentration: He has difficulty with concentration,     Normal Language, naming, repeating,spontaneous speech, He has difficulty writing a complete sentence     Fund of knowledge   CRANIAL NERVES: CN II: Visual fields are full to confrontation. Fundoscopic exam is normal with sharp discs and no vascular changes. Pupils are round  equal and briskly reactive to light. CN III, IV, VI: extraocular movement are normal. No ptosis. CN V: Facial sensation is intact to pinprick in all 3 divisions bilaterally. Corneal responses are intact.  CN VII: Face is symmetric with normal eye closure and smile. CN VIII: Hearing is normal to rubbing fingers CN IX, X: Palate elevates symmetrically. Phonation is normal. CN XI: Head turning and shoulder shrug are intact CN XII: Tongue is midline with normal movements and no atrophy.  MOTOR: There is no pronator drift of out-stretched arms. Muscle bulk and tone are normal. Muscle strength is normal.  REFLEXES: Reflexes are 2+ and symmetric at the biceps, triceps, knees, and ankles. Plantar responses are flexor.  SENSORY: Intact to light touch, pinprick, positional sensation and vibratory sensation are intact in fingers and toes.  COORDINATION: Rapid alternating movements and fine finger movements are intact. There is no dysmetria on finger-to-nose and heel-knee-shin.    GAIT/STANCE: Posture is normal. Gait is steady with normal steps, base, arm swing, and turning. Heel and toe walking are normal. Tandem gait is mildly unsteady  Romberg is absent.   DIAGNOSTIC  DATA (LABS, IMAGING, TESTING) - I reviewed patient records, labs, notes, testing and imaging myself where available.   ASSESSMENT AND PLAN  Bettie Quebodeaux is a 70 y.o. male   Memory loss  Mini-Mental Status Examination is 23/30 today, with animal naming 14  History of subarachnoid hemorrhage due to posterior fossa AVM, hydrocephalus, status post VP shunt  Bring laboratory results for next visit  Increased donepezil from 5 to10 mg every day, add on Namenda 10 mg twice a day    Marcial Pacas, M.D. Ph.D.  [facility] [address] [place], [place] [address] Ph: [phone] Fax: [phone]  CC: Jeffrey Congress, NP, Jeffrey Ebbs, MD, Jeffrey Drafts, FNP

## 2015-08-16 ENCOUNTER — Ambulatory Visit: Payer: Self-pay | Admitting: Neurology

## 2015-08-16 ENCOUNTER — Telehealth: Payer: Self-pay | Admitting: *Deleted

## 2015-08-16 NOTE — Telephone Encounter (Signed)
Patient's wife called at 10:22am and stated he would not come in for his appointment.

## 2015-08-17 ENCOUNTER — Encounter: Payer: Self-pay | Admitting: Neurology

## 2016-03-21 ENCOUNTER — Telehealth: Payer: Self-pay | Admitting: Adult Health

## 2016-03-21 NOTE — Telephone Encounter (Signed)
Pt's wife called said he is stubborn, won't take his meds at all, he fell this morning(he did not get hurt). She is questioning if he should be driving. He would not come to the f/u appt 6/17. This is a Dr Krista Blue pt  An appt was scheduled for 2/5

## 2016-03-21 NOTE — Telephone Encounter (Signed)
LMVM for wife that returned call.  °

## 2016-03-25 ENCOUNTER — Encounter: Payer: Self-pay | Admitting: Adult Health

## 2016-03-25 ENCOUNTER — Ambulatory Visit (INDEPENDENT_AMBULATORY_CARE_PROVIDER_SITE_OTHER): Payer: Medicare Other | Admitting: Adult Health

## 2016-03-25 VITALS — BP 166/89 | HR 55 | Resp 14 | Ht 66.0 in | Wt 145.0 lb

## 2016-03-25 DIAGNOSIS — F0390 Unspecified dementia without behavioral disturbance: Secondary | ICD-10-CM

## 2016-03-25 NOTE — Progress Notes (Signed)
I agree with the assessment and plan as directed by NP . I was available for consultation during the visit. Marland Kitchen   Kyriaki Moder, MD

## 2016-03-25 NOTE — Progress Notes (Signed)
PATIENT: Jeffrey Huerta DOB: 06-May-1945  REASON FOR VISIT: follow up- memory disturbance HISTORY FROM: patient  HISTORY OF PRESENT ILLNESS: Today 03/25/2016 Jeffrey Huerta is a 71 year old male with a history of memory disturbance. He returns today for follow-up. The patient has not been seen in this office in almost one year. The patient is supposed be taking Aricept and Namenda. The patient's wife states that she cannot get him to take his medication. He is able to complete all ADLs independently. He continues to operate a motor vehicle. The wife states that she has never driven with him but she does note that he drives slow. She states that he no longer goes to their [place] because he gets loss. The wife is trying to get control over all the finances. She states that there is been occasions that he has gone to the bank and withdrew a large sum of money. Denies any trouble sleeping. Denies any significant changes in mood or behavior. She reports that the patient's appetite fluctuates. She reports that she no longer has meaningful conversations with her husband. She states that her husband does remained busy. He likes to do things outdoors. She brings in today for an evaluation.  HISTORY 06/21/15: Jeffrey Huerta is a 71 years old right-handed male, accompanied by his wife, and daughter Lynelle Smoke, seen in refer by neurosurgical nurse practitioner Graceann Congress for evaluation of memory loss in May third 2017, his primary care physician is Dr. Nolene Ebbs and nurse practitioner Vonna Drafts.  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49,  He presented with acute intracranial bleeding November fifth 2006, was initially treated at [facility], I personally reviewed CAT scan of the brain without contrast in November 2006, acute subarachnoid hemorrhage with associated intraventricular blood, evidence of obstructive hydrocephalus, there is associated  cerebral edema and mass effect with obliteration of CSF space at the level of superacellar cistern and surrounding brainstem,  Four-vessel angiogram in December 24 2004, 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 Duplicated right middle cerebral artery, a normal developmental radiation.  He was later referred to neurosurgeon Dr. 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 on March 28 2 707, he underwent ventriculoperitoneal shunt placement on March 32 707 for subsequent noncommunicating hydrocephalus,  He went through prolonged comprehensive rehabilitation, has to relearn walking feeding himself again, but he has made marked progress, he is able to driving, continues to drive now, he was able to take care of his household includes paying the bill, fixing the cars for a while, since 2012, he was noted to have progressive worsening memory loss, his wife has to take care of the bill now, he become much less active, he tends to sitting the porch or his car all day long, get easily agitated at evening time, he has lost appetite,  He was put on Aricept 5 mg since March 2017, tolerating it well, no significant side effect notice, he has no gait difficulty,  He  was recently reevaluated by neurosurgeon clinic at [facility], I reviewed her CAT scan of the brain without contrast at [facility] in April 2017 no acute  intracranial abnormality, right frontal approach ventriculoperitoneal shunt with no evidence of hydrocephalus, similar posterior fossa encephalomalacia status post left occipital craniotomy, redemonstrated right frontal lobe encephalomalacia, potentially reflecting sequelae of remote infarction  REVIEW OF SYSTEMS: Out of a complete 14 system review of symptoms, the patient complains only of the following symptoms, and all other reviewed systems are negative.  Hearing loss  ALLERGIES: No Known Allergies  HOME MEDICATIONS: Outpatient Medications Prior to Visit  Medication Sig Dispense Refill  . donepezil (ARICEPT) 10 MG tablet Take 1 tablet (10 mg total) by mouth at bedtime. (Patient not taking: Reported on 03/25/2016) 90 tablet 4  . losartan (COZAAR) 50 MG tablet Take 50 mg by mouth.    . memantine (NAMENDA) 10 MG tablet Take 1 tablet (10 mg total) by mouth 2 (two) times daily. (Patient not taking: Reported on 03/25/2016) 60 tablet 11   No facility-administered medications prior to visit.     PAST MEDICAL HISTORY: Past Medical History:  Diagnosis Date  . Aneurysm ([place])   . AVM (arteriovenous malformation)   . Hydrocephalus   . Hypertension   . Memory loss     PAST SURGICAL HISTORY: Past Surgical History:  Procedure Laterality Date  . BRAIN SURGERY      FAMILY HISTORY: Family History  Problem Relation Age of Onset  . Stroke Mother   . Hypertension Mother     SOCIAL HISTORY: Social History   Social History  . Marital status: Married    Spouse name: N/A  . Number of children: 3  . Years of education: 10th   Occupational History  . Retired    Social History Main Topics  . Smoking status: Never Smoker  . Smokeless tobacco: Never Used  . Alcohol use No  . Drug use: No  . Sexual activity: Not on file   Other Topics Concern  . Not on file   Social History Narrative   Lives at home with wife.   Right-handed.   No caffeine use.      PHYSICAL EXAM  Vitals:    03/25/16 1100  Resp: 14  Weight: 145 lb (65.8 kg)  Height: '5\' 6"'  (1.676 m)   Body mass index is 23.4 kg/m.  MMSE - Mini Mental State Exam 03/25/2016 06/21/2015  Orientation to time 2 4  Orientation to Place 3 4  Registration 3 3  Attention/ Calculation 0 4  Recall 0 0  Language- name 2 objects 2 2  Language- repeat 1 1  Language- follow 3 step command 2 3  Language- read & follow direction 1 1  Write a sentence 1 0  Copy design 0 1  Total score 15 23     Generalized: Well developed, in no acute distress   Neurological examination  Mentation: Alert oriented to time, place, history taking. Follows all commands speech and language fluent Cranial nerve II-XII: Pupils were equal round reactive to light. Extraocular movements were full, visual field were full on confrontational test. Facial sensation and strength were normal. Uvula tongue midline. Head turning and shoulder shrug  were normal and symmetric. Motor: The motor testing reveals 5 over 5 strength of all 4 extremities. Good symmetric motor tone is noted throughout.  Sensory: Sensory testing is intact to soft touch on all 4 extremities. No evidence of extinction is noted.  Coordination: Cerebellar testing  reveals good finger-nose-finger and heel-to-shin bilaterally.  Gait and station: Gait is normal.  Reflexes: Deep tendon reflexes are symmetric and normal bilaterally.   DIAGNOSTIC DATA (LABS, IMAGING, TESTING) - I reviewed patient records, labs, notes, testing and imaging myself where available.  Lab Results  Component Value Date   WBC 5.0 02/21/2010   HGB 15.3 02/21/2010   HCT 45.0 02/21/2010   MCV 85.9 02/21/2010   PLT 162 02/21/2010      Component Value Date/Time   NA 142 02/22/2010 0410   K 4.6 02/22/2010 0410   CL 106 02/22/2010 0410   CO2 31 02/22/2010 0410   GLUCOSE 94 02/22/2010 0410   BUN 14 02/22/2010 0410   CREATININE 1.15 02/22/2010 0410   CALCIUM 9.4 02/22/2010 0410   GFRNONAA >60 02/22/2010 0410    GFRAA  02/22/2010 0410    >60        The eGFR has been calculated using the MDRD equation. This calculation has not been validated in all clinical situations. eGFR's persistently <60 mL/min signify possible Chronic Kidney Disease.       ASSESSMENT AND PLAN 71 y.o. year old male  has a past medical history of Aneurysm ([place]); AVM (arteriovenous malformation); Hydrocephalus; Hypertension; and Memory loss. here with:  1. Dementia  The patient's memory score has declined since the last visit. I have instructed the wife that she could try crushing his medications and put them in applesauce or pudding for the patient to take. We did discuss assistance in the home. I gave her several resources such as [employer] and care patrol. Patient's wife was very thankful. Advised that if his symptoms worsen or he develops new symptoms he should let us know. He will follow-up in 3-4 months or sooner if needed.  I spent 25 minutes with the patient 50% of this time was spent discussing the patient's diagnosis and prognosis.  Ward Givens, MSN, NP-C 03/25/2016, 11:52 AM [facility] [address] [place], [place] [address] [phone]

## 2016-03-25 NOTE — Patient Instructions (Signed)
Try crushing medication  Consider Claudia [employer]anct[employer] [place] [employer] [id number]

## 2016-03-27 ENCOUNTER — Telehealth: Payer: Self-pay | Admitting: *Deleted

## 2016-03-27 NOTE — Telephone Encounter (Signed)
I spoke to pts wife, reiterated about taking meds (crushed in applesauce, pudding).  I told her that there are refills available and pharmacy to call when to pick up.  She verbalized udnerstanding.

## 2016-03-27 NOTE — Telephone Encounter (Signed)
LMVM for wife of pt to call me back relating aricept and namenda.  Pt has appt 07-23-16.  They have refills and I relayed to pharmacist to get ready for pt and they will call to let them know that they are ready.

## 2016-03-27 NOTE — Telephone Encounter (Signed)
Patient's wife is returning your call. She can be reached at [phone].

## 2016-04-05 ENCOUNTER — Other Ambulatory Visit: Payer: Self-pay | Admitting: *Deleted

## 2016-04-05 MED ORDER — MEMANTINE HCL 10 MG PO TABS: 10.0000 mg | ORAL_TABLET | Freq: Two times a day (BID) | ORAL | 4 refills | Status: DC

## 2016-04-05 MED ORDER — DONEPEZIL HCL 10 MG PO TABS: 10.0000 mg | ORAL_TABLET | Freq: Every day | ORAL | 4 refills | Status: DC

## 2016-04-05 NOTE — Telephone Encounter (Signed)
Patients wife called needing mediciations memantine (NAMENDA) 10 MG tablet and donepezil (ARICEPT) 10 MG tablet called into [facility] not a local pharmacy.  Please call

## 2016-04-05 NOTE — Telephone Encounter (Signed)
Spoke to pts wife and she wanted sent to [facility] (she does not have to pay when uses this pharmacy).  I did this for her for a year.

## 2016-05-15 ENCOUNTER — Telehealth: Payer: Self-pay | Admitting: Adult Health

## 2016-05-15 NOTE — Telephone Encounter (Signed)
Spoke to wife, pt at baseline.   Per wife, EMT did not do glucose or Bp check.  No urine frequency.  She is asking about what stage he is in not with testing of 15.  Recommendations?

## 2016-05-15 NOTE — Telephone Encounter (Addendum)
Pt's wife called said this morning he went to the bank, the teller recognized him and asked if he was ok. He said no,said he was shaking, could not remember his name or address. Called 911, bank employees set him down in a chair, EMT's tried to get him to go to hospital but he refused. Said when he got better he proceeded with his transaction and drove home. This afternoon he is fine. She said she has a hard time getting him to take medicine, he has been on  donepezil (ARICEPT) 10 MG tablet for the past 3 days. She is wanting to know if there is a way to tell how far he has progressed with the dementia.  Please call.

## 2016-05-15 NOTE — Telephone Encounter (Signed)
In February- his memory score had declined. Which indicates some progression. Can offer another appointment to assess.

## 2016-05-16 NOTE — Telephone Encounter (Signed)
Regular appointment is fine. Please advise that they should go to emergency room if he has any additional events. Should also consider follow-up with PCP.

## 2016-05-16 NOTE — Telephone Encounter (Signed)
Made appt 06-24-16 at 1100 her next available.  He is to see pcp in the meantime.

## 2016-05-17 NOTE — Telephone Encounter (Signed)
I spoke to wife and moved appt up to 06-24-16 1100.  He has not had any other episodes.  If he should, to seek ED care  or see pcp.  She verbalized understanding and appreciation.

## 2016-06-24 ENCOUNTER — Ambulatory Visit (INDEPENDENT_AMBULATORY_CARE_PROVIDER_SITE_OTHER): Payer: Medicare Other | Admitting: Adult Health

## 2016-06-24 ENCOUNTER — Encounter: Payer: Self-pay | Admitting: Adult Health

## 2016-06-24 ENCOUNTER — Encounter (INDEPENDENT_AMBULATORY_CARE_PROVIDER_SITE_OTHER): Payer: Self-pay

## 2016-06-24 VITALS — BP 126/79 | HR 62 | Ht 66.0 in | Wt 138.6 lb

## 2016-06-24 DIAGNOSIS — F0390 Unspecified dementia without behavioral disturbance: Secondary | ICD-10-CM | POA: Diagnosis not present

## 2016-06-24 NOTE — Patient Instructions (Signed)
Memory score is stable Continue Aricept  referral to home health  If your symptoms worsen or you develop new symptoms please let us know. '

## 2016-06-24 NOTE — Progress Notes (Signed)
PATIENT: Jeffrey Huerta DOB: Dec 11, 1945  REASON FOR VISIT: follow up- memory HISTORY FROM: patient  HISTORY OF PRESENT ILLNESS: Jeffrey Huerta is a 71 year old male with a history of memory disturbance. He returns today for follow-up. His wife asked to speak with me before going with the patient. She states that she is having a hard time getting him to bathe  and change his clothes daily. She states that he is not agitated or aggressive just refuses to do these things. The patient reports that he is able to complete all ADLs independently. He does operate a motor vehicle but only drops to familiar places. His wife states that there has been times that he has gotten lost. She states that he seems to do well going to 2-3 familiar places. Denies any trouble sleeping. Good appetite. Although his wife has noted that he has lost some weight. He is on Aricept. Wife states that he still likes to do things in the yard. She is considering putting him in an adult day center during the week. She is also wondering if she can have a come into the home to help with his hygiene needs. Patient returns today for an evaluation.  HISTORY Today 03/25/2016 Jeffrey Huerta is a 71 year old male with a history of memory disturbance. He returns today for follow-up. The patient has not been seen in this office in almost one year. The patient is supposed be taking Aricept and Namenda. The patient's wife states that she cannot get him to take his medication. He is able to complete all ADLs independently. He continues to operate a motor vehicle. The wife states that she has never driven with him but she does note that he drives slow. She states that he no longer goes to their [place] because he gets loss. The wife is trying to get control over all the finances. She states that there is been occasions that he has gone to the bank and withdrew a large sum of money. Denies any trouble sleeping. Denies any significant changes in mood or behavior.  She reports that the patient's appetite fluctuates. She reports that she no longer has meaningful conversations with her husband. She states that her husband does remained busy. He likes to do things outdoors. She brings in today for an evaluation.  HISTORY 06/21/15: Jeffrey Huerta a 71 years old right-handed male, accompanied by his wife, and daughter Jeffrey Huerta, seen in refer by neurosurgical nurse practitioner Jeffrey Huerta for evaluation of memory loss in May third 2017, his primary care physician is Dr. Nolene Ebbs and nurse practitioner Vonna Drafts.  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70,  He presented with acute intracranial bleeding November fifth 2006, was initially treated at [facility], I personally reviewed CAT scan of the brain without contrast in November 2006, acute subarachnoid hemorrhage with associated intraventricular blood, evidence of obstructive hydrocephalus, there is associated cerebral edema and mass effect with obliteration of CSF space at the level of superacellar cistern and surrounding brainstem,  Four-vessel angiogram in December 24 2004, 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  He was later referred to neurosurgeon Dr. Redmond Pulling at [facility] for suboccipital craniotomy for resection of complex cerebellar AVM, including resecting aneurysm on the feeding  vessels on March 28 2 707, he underwent ventriculoperitoneal shunt placement on March 32 707 for subsequent noncommunicating hydrocephalus,  He went through prolonged comprehensive rehabilitation, has to relearn walking feeding himself again, but he has made marked progress, he is able to driving, continues to drive now, he was able to take care of his household includes paying the bill, fixing the cars for a while, since 2012, he was noted to have progressive worsening memory loss, his wife has to take care of the bill now, he become much less active, he tends to sitting the porch or his car all day long, get easily agitated at evening time, he has lost appetite,  He was put on Aricept 5 mg since March 2017, tolerating it well, no significant side effect notice, he has no gait difficulty,  He was recently reevaluated by neurosurgeon clinic at [facility], I reviewed her CAT scan of the brain without contrast at [facility] in April 2017 no acute intracranial abnormality, right frontal approach ventriculoperitoneal shunt with no evidence of hydrocephalus, similar posterior fossa encephalomalacia status post left occipital craniotomy, redemonstrated right frontal lobe encephalomalacia, potentially reflecting sequelae of remote infarction   REVIEW OF SYSTEMS: Out of a complete 14 system review of symptoms, the patient complains only of the following symptoms, and all other reviewed systems are negative.  ALLERGIES: No Known Allergies  HOME MEDICATIONS: Outpatient Medications Prior to Visit  Medication Sig Dispense Refill  . donepezil (ARICEPT) 10 MG tablet Take 1 tablet (10 mg total) by mouth at bedtime. 90 tablet 4  . losartan (COZAAR) 50 MG tablet Take 50 mg by mouth.    . memantine (NAMENDA) 10 MG tablet Take 1 tablet (10 mg total) by mouth 2 (two) times daily. 180 tablet 4   No facility-administered medications prior to visit.     PAST MEDICAL HISTORY: Past Medical History:    Diagnosis Date  . Aneurysm ([place])   . AVM (arteriovenous malformation)   . Hydrocephalus   . Hypertension   . Memory loss     PAST SURGICAL HISTORY: Past Surgical History:  Procedure Laterality Date  . BRAIN SURGERY      FAMILY HISTORY: Family History  Problem Relation Age of Onset  . Stroke Mother   . Hypertension Mother     SOCIAL HISTORY: Social History   Social History  . Marital status: Married    Spouse name: N/A  . Number of children: 3  . Years of education: 10th   Occupational History  . Retired    Social History Main Topics  . Smoking status: Never Smoker  . Smokeless tobacco: Never Used  . Alcohol use No  . Drug use: No  . Sexual activity: Not on file   Other Topics Concern  . Not on file   Social History Narrative   Lives at home with wife.   Right-handed.   No caffeine use.      PHYSICAL EXAM  Vitals:   06/24/16 1105  BP: 126/79  Pulse: 62  Weight: 138 lb 9.6 oz (62.9 kg)  Height: '5\' 6"'  (1.676 m)   There is no height or weight on file to calculate BMI.   MMSE - Mini Mental State Exam 06/24/2016 03/25/2016 06/21/2015  Orientation  to time 0 2 4  Orientation to Place '4 3 4  ' Registration '3 3 3  ' Attention/ Calculation 0 0 4  Recall 0 0 0  Language- name 2 objects '2 2 2  ' Language- repeat '1 1 1  ' Language- follow 3 step command '3 2 3  ' Language- read & follow direction '1 1 1  ' Write a sentence 0 1 0  Copy design 1 0 1  Total score '15 15 23     ' Generalized: Well developed, in no acute distress   Neurological examination  Mentation: Alert oriented to time, place, history taking. Follows all commands speech and language fluent Cranial nerve II-XII: Pupils were equal round reactive to light. Extraocular movements were full, visual field were full on confrontational test. Facial sensation and strength were normal. Uvula tongue midline. Head turning and shoulder shrug  were normal and symmetric. Motor: The motor testing reveals 5 over 5  strength of all 4 extremities. Good symmetric motor tone is noted throughout.  Sensory: Sensory testing is intact to soft touch on all 4 extremities. No evidence of extinction is noted.  Coordination: Cerebellar testing reveals good finger-nose-finger and heel-to-shin bilaterally.  Gait and station: Gait is normal.  Reflexes: Deep tendon reflexes are symmetric and normal bilaterally.   DIAGNOSTIC DATA (LABS, IMAGING, TESTING) - I reviewed patient records, labs, notes, testing and imaging myself where available.  Lab Results  Component Value Date   WBC 5.0 02/21/2010   HGB 15.3 02/21/2010   HCT 45.0 02/21/2010   MCV 85.9 02/21/2010   PLT 162 02/21/2010      Component Value Date/Time   NA 142 02/22/2010 0410   K 4.6 02/22/2010 0410   CL 106 02/22/2010 0410   CO2 31 02/22/2010 0410   GLUCOSE 94 02/22/2010 0410   BUN 14 02/22/2010 0410   CREATININE 1.15 02/22/2010 0410   CALCIUM 9.4 02/22/2010 0410   GFRNONAA >60 02/22/2010 0410   GFRAA  02/22/2010 0410    >60        The eGFR has been calculated using the MDRD equation. This calculation has not been validated in all clinical situations. eGFR's persistently <60 mL/min signify possible Chronic Kidney Disease.   Lab Results  Component Value Date   CHOL (H) 02/22/2010    248        ATP III CLASSIFICATION:  <200     mg/dL   Desirable  200-239  mg/dL   Borderline High  >=240    mg/dL   High          HDL 61 02/22/2010   LDLCALC (H) 02/22/2010    173        Total Cholesterol/HDL:CHD Risk Coronary Heart Disease Risk Table                     Men   Women  1/2 Average Risk   3.4   3.3  Average Risk       5.0   4.4  2 X Average Risk   9.6   7.1  3 X Average Risk  23.4   11.0        Use the calculated Patient Ratio above and the CHD Risk Table to determine the patient's CHD Risk.        ATP III CLASSIFICATION (LDL):  <100     mg/dL   Optimal  100-129  mg/dL   Near or Above  Optimal  130-159  mg/dL    Borderline  160-189  mg/dL   High  >190     mg/dL   Very High   TRIG 70 02/22/2010   CHOLHDL 4.1 02/22/2010      ASSESSMENT AND PLAN 71 y.o. year old male  has a past medical history of Aneurysm ([place]); AVM (arteriovenous malformation); Hydrocephalus; Hypertension; and Memory loss. here with:  1. Memory disturbance  The patient's memory score has remained stable. He will remain on Aricept 10 mg at bedtime. We discussed possibly starting Namenda however the wife states that she has a hard time getting him to take his medication. For now we will continue to monitor. I will refer the patient for home health therapy for  an aide to come help him with his bathing needs. I do feel that an adult day center would be beneficial. I advised that the patient's driving should be scrutinized. Wife verbalized understanding. He will follow-up in 6 months or sooner if needed.  I spent 25 minutes with the patient and his wife. 50% this time was spent discussing resources.   Ward Givens, MSN, NP-C 06/24/2016, 10:59 AM [facility] [address], [place], [place] [address] [phone]

## 2016-06-26 ENCOUNTER — Other Ambulatory Visit: Payer: Self-pay | Admitting: *Deleted

## 2016-06-26 ENCOUNTER — Telehealth: Payer: Self-pay | Admitting: Adult Health

## 2016-06-26 DIAGNOSIS — F0390 Unspecified dementia without behavioral disturbance: Secondary | ICD-10-CM

## 2016-06-26 NOTE — Telephone Encounter (Signed)
LMVM for wife that order is ready and placed up front or can fax.  Up to her.

## 2016-06-26 NOTE — Progress Notes (Signed)
Signed.

## 2016-06-26 NOTE — Telephone Encounter (Signed)
Patient's wife [name] called office in reference to a shower chair for patient.  Needing [facility]  Please call

## 2016-07-01 NOTE — Progress Notes (Signed)
I have reviewed and agreed above plan. 

## 2016-07-10 ENCOUNTER — Telehealth: Payer: Self-pay | Admitting: Adult Health

## 2016-07-10 NOTE — Telephone Encounter (Signed)
Pt's wife called said he is refusing to medication, not willing to take a bath, paranoid, using profanity for about 2 weeks. Said she will leave the house and stay gone for awhile. She is at a loss of what to do. Please call

## 2016-07-10 NOTE — Telephone Encounter (Signed)
Noted. If bloodwork and UA is normal. We may have to consider adding on medication to help with behavioral problems. Please inform wife of this.
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
## 2016-07-11 NOTE — Telephone Encounter (Signed)
I relayed message below to wife.  She was frustrated in that pt not wanting take medications that he has now from anyone and to get another medication would be difficult.  She will get him to pcp for ua and let us know results.
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
## 2016-07-23 ENCOUNTER — Ambulatory Visit: Payer: Self-pay | Admitting: Adult Health

## 2017-01-01 ENCOUNTER — Encounter (INDEPENDENT_AMBULATORY_CARE_PROVIDER_SITE_OTHER): Payer: Self-pay

## 2017-01-01 ENCOUNTER — Encounter: Payer: Self-pay | Admitting: Adult Health

## 2017-01-01 ENCOUNTER — Ambulatory Visit: Payer: Medicare Other | Admitting: Adult Health

## 2017-01-01 VITALS — BP 166/87 | HR 62 | Wt 150.0 lb

## 2017-01-01 DIAGNOSIS — F0390 Unspecified dementia without behavioral disturbance: Secondary | ICD-10-CM | POA: Diagnosis not present

## 2017-01-01 NOTE — Progress Notes (Signed)
I have reviewed and agreed above plan. 

## 2017-01-01 NOTE — Progress Notes (Signed)
PATIENT: Jeffrey Huerta DOB: 11-21-1945  REASON FOR VISIT: follow up-disturbance HISTORY FROM: patient  HISTORY OF PRESENT ILLNESS: Today 01/01/17 Jeffrey Huerta is a 71 year old male with a history of memory disturbance.  He returns today for follow-up.  His wife is with him.  She reports that they are considering sending him to an adult day center for 4-5 hours during the day.  She reports that she cannot get him to take his medication.  She reports that he is able to complete all ADLs independently however he often will skip baths despite her prompting him.  She reports that he has a good appetite.  She reports that he does drive on occasion.  Denies any trouble sleeping.  She does report that sometimes he gets up during the night but he does come back to bed.  A couple months ago the patient was having thoughts of harming himself and behavior changes.  His wife reports that that has resolved.  He returns today for an evaluation.  HISTORY 06/24/16: Jeffrey Huerta is a 71 year old male with a history of memory disturbance. He returns today for follow-up. His wife asked to speak with me before going with the patient. She states that she is having a hard time getting him to bathe  and change his clothes daily. She states that he is not agitated or aggressive just refuses to do these things. The patient reports that he is able to complete all ADLs independently. He does operate a motor vehicle but only drops to familiar places. His wife states that there has been times that he has gotten lost. She states that he seems to do well going to 2-3 familiar places. Denies any trouble sleeping. Good appetite. Although his wife has noted that he has lost some weight. He is on Aricept. Wife states that he still likes to do things in the yard. She is considering putting him in an adult day center during the week. She is also wondering if she can have a come into the home to help with his hygiene needs. Patient returns today for  an evaluation.   REVIEW OF SYSTEMS: Out of a complete 14 system review of symptoms, the patient complains only of the following symptoms, and all other reviewed systems are negative.  Hearing loss, memory loss, sleep walking  ALLERGIES: No Known Allergies  HOME MEDICATIONS: Outpatient Medications Prior to Visit  Medication Sig Dispense Refill  . donepezil (ARICEPT) 10 MG tablet Take 1 tablet (10 mg total) by mouth at bedtime. 90 tablet 4  . losartan (COZAAR) 50 MG tablet Take 50 mg by mouth.    . memantine (NAMENDA) 10 MG tablet Take 1 tablet (10 mg total) by mouth 2 (two) times daily. 180 tablet 4   No facility-administered medications prior to visit.     PAST MEDICAL HISTORY: Past Medical History:  Diagnosis Date  . Aneurysm ([place])   . AVM (arteriovenous malformation)   . Dementia   . Hydrocephalus   . Hypertension   . Memory loss     PAST SURGICAL HISTORY: Past Surgical History:  Procedure Laterality Date  . BRAIN SURGERY      FAMILY HISTORY: Family History  Problem Relation Age of Onset  . Stroke Mother   . Hypertension Mother     SOCIAL HISTORY: Social History   Socioeconomic History  . Marital status: Married    Spouse name: Not on file  . Number of children: 3  . Years of education: 10th  .  Highest education level: Not on file  Social Needs  . Financial resource strain: Not on file  . Food insecurity - worry: Not on file  . Food insecurity - inability: Not on file  . Transportation needs - medical: Not on file  . Transportation needs - non-medical: Not on file  Occupational History  . Occupation: Retired  Tobacco Use  . Smoking status: Never Smoker  . Smokeless tobacco: Never Used  Substance and Sexual Activity  . Alcohol use: No    Alcohol/week: 0.0 oz  . Drug use: No  . Sexual activity: Not on file  Other Topics Concern  . Not on file  Social History Narrative   Lives at home with wife.   Right-handed.   No caffeine use.       PHYSICAL EXAM  Vitals:   01/01/17 0919  BP: (!) 166/87  Pulse: 62  Weight: 150 lb (68 kg)   Body mass index is 24.21 kg/m.  MMSE - Mini Mental State Exam 01/01/2017 06/24/2016 03/25/2016  Orientation to time 1 0 2  Orientation to Place 4 4 3   Registration 1 3 3   Attention/ Calculation 0 0 0  Recall 0 0 0  Language- name 2 objects 2 2 2   Language- repeat 0 1 1  Language- follow 3 step command 1 3 2   Language- follow 3 step command-comments folded twice, handed to RN - -  Language- read & follow direction 1 1 1   Write a sentence 0 0 1  Copy design 1 1 0  Total score 11 15 15      Generalized: Well developed, in no acute distress   Neurological examination  Mentation: Alert . Follows all commands speech and language fluent Cranial nerve II-XII: Pupils were equal round reactive to light. Extraocular movements were full, visual field were full on confrontational test. Facial sensation and strength were normal. Uvula tongue midline. Head turning and shoulder shrug  were normal and symmetric. Motor: The motor testing reveals 5 over 5 strength of all 4 extremities. Good symmetric motor tone is noted throughout.  Sensory: Sensory testing is intact to soft touch on all 4 extremities. No evidence of extinction is noted.  Coordination: Cerebellar testing reveals good finger-nose-finger and heel-to-shin bilaterally.  Gait and station: Gait is normal. Reflexes: Deep tendon reflexes are symmetric and normal bilaterally.   DIAGNOSTIC DATA (LABS, IMAGING, TESTING) - I reviewed patient records, labs, notes, testing and imaging myself where available.     ASSESSMENT AND PLAN 71 y.o. year old male  has a past medical history of Aneurysm ([place]), AVM (arteriovenous malformation), Dementia, Hydrocephalus, Hypertension, and Memory loss. here with:  1.  Dementia  Patient's memory score has declined.  I do feel that the patient would benefit from an adult day center.  Perhaps he would take  his medication if administered by them.  Because of the patient's memory score I have advised that he should not operate a motor vehicle.  His wife is in agreement with this.  I have advised that if his symptoms worsen or he develops new symptoms he should let us know.  He will follow-up in 6 months or sooner if needed.    Ward Givens, MSN, NP-C 01/01/2017, 9:38 AM [facility] [address], [place], [place] [address] [phone]

## 2017-01-01 NOTE — Patient Instructions (Signed)
Your Plan:  Continue Aricept and Namenda NO DRIVING If your symptoms worsen or you develop new symptoms please let us know.    Thank you for coming to see [place] at [facility]. I hope we have been able to provide you high quality care today.  You may receive a patient satisfaction survey over the next few weeks. We would appreciate your feedback and comments so that we may continue to improve ourselves and the health of our patients.

## 2017-01-02 ENCOUNTER — Telehealth: Payer: Self-pay | Admitting: *Deleted

## 2017-01-02 NOTE — Telephone Encounter (Signed)
Pt [employer] form on Leilani Able desk

## 2017-01-07 NOTE — Telephone Encounter (Signed)
Called wife to advise her the [facility] [employer] forms are completed. Explained to her that Jinny Blossom wrote on form for [facility] to give patient Namenda morning dose and Aricept in the morning. Wife has not been able to get patient to take medications, so NP felt he may take for [facility]. Wife stated he will begin by going for 6 hour days. She verbalized understanding, stated she will pick papers up tomorrow. This RN advised they will be at front desk for pick up. Wife verbalized understanding, appreciation. Papers sent to MR with note that wife will pick up tomorrow.

## 2017-01-08 NOTE — Telephone Encounter (Signed)
Pt [employer] form @ front desk for p/u

## 2017-02-09 ENCOUNTER — Other Ambulatory Visit: Payer: Self-pay

## 2017-02-09 ENCOUNTER — Observation Stay (HOSPITAL_COMMUNITY)
Admission: EM | Admit: 2017-02-09 | Discharge: 2017-02-10 | Disposition: A | Discharge status: Home / Self Care | Payer: Medicare Other | Attending: Family Medicine | Admitting: Family Medicine

## 2017-02-09 ENCOUNTER — Emergency Department (HOSPITAL_COMMUNITY): Payer: Medicare Other

## 2017-02-09 ENCOUNTER — Encounter (HOSPITAL_COMMUNITY): Payer: Self-pay | Admitting: Emergency Medicine

## 2017-02-09 DIAGNOSIS — X31XXXA Exposure to excessive natural cold, initial encounter: Secondary | ICD-10-CM | POA: Diagnosis not present

## 2017-02-09 DIAGNOSIS — Z008 Encounter for other general examination: Secondary | ICD-10-CM | POA: Diagnosis present

## 2017-02-09 DIAGNOSIS — Z982 Presence of cerebrospinal fluid drainage device: Secondary | ICD-10-CM

## 2017-02-09 DIAGNOSIS — I10 Essential (primary) hypertension: Secondary | ICD-10-CM | POA: Insufficient documentation

## 2017-02-09 DIAGNOSIS — E860 Dehydration: Secondary | ICD-10-CM | POA: Diagnosis not present

## 2017-02-09 DIAGNOSIS — F0280 Dementia in other diseases classified elsewhere without behavioral disturbance: Secondary | ICD-10-CM | POA: Insufficient documentation

## 2017-02-09 DIAGNOSIS — G309 Alzheimer's disease, unspecified: Secondary | ICD-10-CM | POA: Insufficient documentation

## 2017-02-09 DIAGNOSIS — M6282 Rhabdomyolysis: Secondary | ICD-10-CM | POA: Diagnosis not present

## 2017-02-09 DIAGNOSIS — T68XXXA Hypothermia, initial encounter: Secondary | ICD-10-CM

## 2017-02-09 DIAGNOSIS — F0390 Unspecified dementia without behavioral disturbance: Secondary | ICD-10-CM | POA: Diagnosis present

## 2017-02-09 LAB — URINALYSIS, ROUTINE W REFLEX MICROSCOPIC
BACTERIA UA: NONE SEEN
Bilirubin Urine: NEGATIVE
GLUCOSE, UA: NEGATIVE mg/dL
KETONES UR: 20 mg/dL — AB
Leukocytes, UA: NEGATIVE
Nitrite: NEGATIVE
PROTEIN: 30 mg/dL — AB
Specific Gravity, Urine: 1.023 (ref 1.005–1.030)
pH: 5 (ref 5.0–8.0)

## 2017-02-09 LAB — COMPREHENSIVE METABOLIC PANEL
ALT: 25 U/L (ref 17–63)
AST: 71 U/L — ABNORMAL HIGH (ref 15–41)
Albumin: 4.1 g/dL (ref 3.5–5.0)
Alkaline Phosphatase: 51 U/L (ref 38–126)
Anion gap: 19 — ABNORMAL HIGH (ref 5–15)
BILIRUBIN TOTAL: 1.5 mg/dL — AB (ref 0.3–1.2)
BUN: 52 mg/dL — AB (ref 6–20)
CALCIUM: 8.9 mg/dL (ref 8.9–10.3)
CO2: 19 mmol/L — ABNORMAL LOW (ref 22–32)
CREATININE: 1.34 mg/dL — AB (ref 0.61–1.24)
Chloride: 108 mmol/L (ref 101–111)
GFR calc non Af Amer: 51 mL/min — ABNORMAL LOW (ref >60)
GFR, EST AFRICAN AMERICAN: 59 mL/min — AB (ref >60)
Glucose, Bld: 90 mg/dL (ref 65–99)
Potassium: 3.8 mmol/L (ref 3.5–5.1)
Sodium: 146 mmol/L — ABNORMAL HIGH (ref 135–145)
TOTAL PROTEIN: 7.8 g/dL (ref 6.5–8.1)

## 2017-02-09 LAB — CBC
HEMATOCRIT: 45.7 % (ref 39.0–52.0)
Hemoglobin: 13.8 g/dL (ref 13.0–17.0)
MCH: 27.1 pg (ref 26.0–34.0)
MCHC: 30.2 g/dL (ref 30.0–36.0)
MCV: 89.6 fL (ref 78.0–100.0)
PLATELETS: 196 10*3/uL (ref 150–400)
RBC: 5.1 MIL/uL (ref 4.22–5.81)
RDW: 14.1 % (ref 11.5–15.5)
WBC: 9.6 10*3/uL (ref 4.0–10.5)

## 2017-02-09 LAB — LACTIC ACID, PLASMA
LACTIC ACID, VENOUS: 1.3 mmol/L (ref 0.5–1.9)
Lactic Acid, Venous: 2.9 mmol/L (ref 0.5–1.9)
Lactic Acid, Venous: 1.3 mmol/L (ref 0.5–1.9)

## 2017-02-09 LAB — DIFFERENTIAL
BASOS PCT: 0 %
Basophils Absolute: 0 10*3/uL (ref 0.0–0.1)
EOS ABS: 0 10*3/uL (ref 0.0–0.7)
Eosinophils Relative: 0 %
LYMPHS ABS: 1.3 10*3/uL (ref 0.7–4.0)
Lymphocytes Relative: 14 %
MONO ABS: 0.2 10*3/uL (ref 0.1–1.0)
MONOS PCT: 2 %
Neutro Abs: 7.9 10*3/uL — ABNORMAL HIGH (ref 1.7–7.7)
Neutrophils Relative %: 84 %

## 2017-02-09 LAB — TROPONIN I

## 2017-02-09 MED ORDER — DOCUSATE SODIUM 100 MG PO CAPS
100.0000 mg | ORAL_CAPSULE | Freq: Two times a day (BID) | ORAL | Status: DC
  Administered 2017-02-09 – 2017-02-10 (×2): 100 mg via ORAL
  Filled 2017-02-09 (×2): qty 1

## 2017-02-09 MED ORDER — DONEPEZIL HCL 5 MG PO TABS
10.0000 mg | ORAL_TABLET | Freq: Every day | ORAL | Status: DC
  Administered 2017-02-09: 10 mg via ORAL
  Filled 2017-02-09: qty 2

## 2017-02-09 MED ORDER — MEMANTINE HCL 10 MG PO TABS
10.0000 mg | ORAL_TABLET | Freq: Two times a day (BID) | ORAL | Status: DC
  Administered 2017-02-09 – 2017-02-10 (×2): 10 mg via ORAL
  Filled 2017-02-09 (×2): qty 1

## 2017-02-09 MED ORDER — ACETAMINOPHEN 325 MG PO TABS: 650.0000 mg | ORAL_TABLET | Freq: Four times a day (QID) | ORAL | Status: DC | PRN

## 2017-02-09 MED ORDER — ONDANSETRON HCL 4 MG PO TABS: 4.0000 mg | ORAL_TABLET | Freq: Four times a day (QID) | ORAL | Status: DC | PRN

## 2017-02-09 MED ORDER — ACETAMINOPHEN 650 MG RE SUPP: 650.0000 mg | Freq: Four times a day (QID) | RECTAL | Status: DC | PRN

## 2017-02-09 MED ORDER — SODIUM CHLORIDE 0.9 % IV SOLN
INTRAVENOUS | Status: DC
  Administered 2017-02-09: 14:00:00 via INTRAVENOUS

## 2017-02-09 MED ORDER — ONDANSETRON HCL 4 MG/2ML IJ SOLN: 4.0000 mg | Freq: Four times a day (QID) | INTRAMUSCULAR | Status: DC | PRN

## 2017-02-09 MED ORDER — ENOXAPARIN SODIUM 40 MG/0.4ML ~~LOC~~ SOLN
40.0000 mg | SUBCUTANEOUS | Status: DC
  Administered 2017-02-09: 40 mg via SUBCUTANEOUS
  Filled 2017-02-09: qty 0.4

## 2017-02-09 NOTE — ED Triage Notes (Signed)
Pt with hx of dementia Missing 3 days last seen 12/19 [place] [address]  Pt from [place]

## 2017-02-09 NOTE — ED Provider Notes (Signed)
[facility] EMERGENCY DEPARTMENT Provider Note   CSN: [id number] Arrival date & time: 02/09/17  1258     History   Chief Complaint Chief Complaint  Patient presents with  . found after missing 3 days    HPI Jeffrey Huerta is a 71 y.o. male.  The history is provided by the patient and the EMS personnel. The history is limited by the condition of the patient (Hx dementia).  Pt was seen at 1300. Per EMS and pt:  Pt missing since 02/15/2017 from [place]. Hx dementia. [place] Pt's shoes/socks wet. No reported trauma. Pt himself states he "feels just fine."    Past Medical History:  Diagnosis Date  . Aneurysm ([place])   . AVM (arteriovenous malformation)   . Dementia   . Hydrocephalus   . Hypertension   . Memory loss     Patient Active Problem List   Diagnosis Date Noted  . Memory loss 06/21/2015  . AVM (arteriovenous malformation) 06/21/2015    Past Surgical History:  Procedure Laterality Date  . BRAIN SURGERY         Home Medications    Prior to Admission medications   Medication Sig Start Date End Date Taking? Authorizing Provider  donepezil (ARICEPT) 10 MG tablet Take 1 tablet (10 mg total) by mouth at bedtime. Patient not taking: Reported on 02/09/2017 04/05/16   Ward Givens, NP  losartan (COZAAR) 50 MG tablet Take 50 mg by mouth.    [provider]  memantine (NAMENDA) 10 MG tablet Take 1 tablet (10 mg total) by mouth 2 (two) times daily. Patient not taking: Reported on 02/09/2017 04/05/16   Ward Givens, NP    Family History Family History  Problem Relation Age of Onset  . Stroke Mother   . Hypertension Mother     Social History Social History   Tobacco Use  . Smoking status: Never Smoker  . Smokeless tobacco: Never Used  Substance Use Topics  . Alcohol use: No    Alcohol/week: 0.0 oz  . Drug use: No     Allergies   Patient has no known allergies.   Review of Systems Review of  Systems  Unable to perform ROS: Dementia     Physical Exam Updated Vital Signs BP 129/75   Pulse 91   Temp 97.9 F (36.6 C) (Oral)   Resp 18   Ht 5\' 6"  (1.676 m)   Wt 72.6 kg (160 lb)   SpO2 100%   BMI 25.82 kg/m   BP (!) 152/87 (BP Location: Right Arm)   Pulse 98   Temp 97.9 F (36.6 C) (Oral)   Resp 18   Ht 5\' 6"  (1.676 m)   Wt 72.6 kg (160 lb)   SpO2 98%   BMI 25.82 kg/m    Physical Exam 1305: Physical examination:  Nursing notes reviewed; Vital signs and O2 SAT reviewed;  Constitutional: Thin, In no acute distress; Head:  Normocephalic, atraumatic; Eyes: EOMI, PERRL, No scleral icterus; ENMT: Mouth and pharynx normal, Mucous membranes dry; Neck: Supple, Full range of motion, No lymphadenopathy; Cardiovascular: Regular rate and rhythm, No gallop; Respiratory: Breath sounds clear & equal bilaterally, No wheezes.  Speaking full sentences with ease, Normal respiratory effort/excursion; Chest: Nontender, Movement normal. No abrasions or ecchymosis.; Abdomen: Soft, Nontender, Nondistended, Normal bowel sounds. No abrasions or ecchymosis; Genitourinary: No CVA tenderness; Spine:  No midline CS, TS, LS tenderness.;; Extremities: Pulses normal, No tenderness, No edema,  No calf edema or asymmetry.; Neuro: Awake, alert, confused per hx dementia. Major CN grossly intact. No facial droop. Speech clear. Moves all extremities spontaneously and to command without apparent gross focal motor deficits.; Skin: Color normal, cool, Dry.    ED Treatments / Results  Labs (all labs ordered are listed, but only abnormal results are displayed)   EKG  EKG Interpretation  Date/Time:  Sunday February 09 2017 13:41:51 EST Ventricular Rate:  96 PR Interval:    QRS Duration: 84 QT Interval:  393 QTC Calculation: 497 R Axis:   10 Text Interpretation:  Sinus rhythm Borderline short PR interval Abnormal R-wave progression, early transition Borderline repolarization abnormality Borderline prolonged  QT interval Baseline wander Artifact When compared with ECG of 02/21/2010 QT has lengthened Nonspecific ST and T wave abnormality is now Present Confirmed by Francine Graven [phone]) on 02/09/2017 1:58:22 PM       Radiology   Procedures Procedures (including critical care time)  Medications Ordered in ED Medications  0.9 %  sodium chloride infusion ( Intravenous New Bag/Given 02/09/17 1353)     Initial Impression / Assessment and Plan / ED Course  I have reviewed the triage vital signs and the nursing notes.  Pertinent labs & imaging results that were available during my care of the patient were reviewed by me and considered in my medical decision making (see chart for details).  MDM Reviewed: previous chart, nursing note and vitals Reviewed previous: labs and ECG Interpretation: labs, ECG, x-ray and CT scan    Results for orders placed or performed during the hospital encounter of 02/09/17  CBC  Result Value Ref Range   WBC 9.6 4.0 - 10.5 K/uL   RBC 5.10 4.22 - 5.81 MIL/uL   Hemoglobin 13.8 13.0 - 17.0 g/dL   HCT 45.7 39.0 - 52.0 %   MCV 89.6 78.0 - 100.0 fL   MCH 27.1 26.0 - 34.0 pg   MCHC 30.2 30.0 - 36.0 g/dL   RDW 14.1 11.5 - 15.5 %   Platelets 196 150 - 400 K/uL  Comprehensive metabolic panel  Result Value Ref Range   Sodium 146 (H) 135 - 145 mmol/L   Potassium 3.8 3.5 - 5.1 mmol/L   Chloride 108 101 - 111 mmol/L   CO2 19 (L) 22 - 32 mmol/L   Glucose, Bld 90 65 - 99 mg/dL   BUN 52 (H) 6 - 20 mg/dL   Creatinine, Ser 1.34 (H) 0.61 - 1.24 mg/dL   Calcium 8.9 8.9 - 10.3 mg/dL   Total Protein 7.8 6.5 - 8.1 g/dL   Albumin 4.1 3.5 - 5.0 g/dL   AST 71 (H) 15 - 41 U/L   ALT 25 17 - 63 U/L   Alkaline Phosphatase 51 38 - 126 U/L   Total Bilirubin 1.5 (H) 0.3 - 1.2 mg/dL   GFR calc non Af Amer 51 (L) >60 mL/min   GFR calc Af Amer 59 (L) >60 mL/min   Anion gap 19 (H) 5 - 15  CK total and CKMB (cardiac)not at [facility]  Result Value Ref Range   Total CK 1,796 (H) 49 -  397 U/L   CK, MB PENDING 0.5 - 5.0 ng/mL   Relative Index PENDING 0.0 - 2.5  Differential  Result Value Ref Range   Neutrophils Relative % 84 %   Neutro Abs 7.9 (H) 1.7 - 7.7 K/uL   Lymphocytes Relative 14 %   Lymphs Abs 1.3 0.7 - 4.0 K/uL   Monocytes Relative  2 %   Monocytes Absolute 0.2 0.1 - 1.0 K/uL   Eosinophils Relative 0 %   Eosinophils Absolute 0.0 0.0 - 0.7 K/uL   Basophils Relative 0 %   Basophils Absolute 0.0 0.0 - 0.1 K/uL  Urinalysis, Routine w reflex microscopic  Result Value Ref Range   Color, Urine YELLOW YELLOW   APPearance HAZY (A) CLEAR   Specific Gravity, Urine 1.023 1.005 - 1.030   pH 5.0 5.0 - 8.0   Glucose, UA NEGATIVE NEGATIVE mg/dL   Hgb urine dipstick MODERATE (A) NEGATIVE   Bilirubin Urine NEGATIVE NEGATIVE   Ketones, ur 20 (A) NEGATIVE mg/dL   Protein, ur 30 (A) NEGATIVE mg/dL   Nitrite NEGATIVE NEGATIVE   Leukocytes, UA NEGATIVE NEGATIVE   RBC / HPF 0-5 0 - 5 RBC/hpf   WBC, UA 0-5 0 - 5 WBC/hpf   Bacteria, UA NONE SEEN NONE SEEN   Squamous Epithelial / LPF 0-5 (A) NONE SEEN   Mucus PRESENT    Hyaline Casts, UA PRESENT    Granular Casts, UA PRESENT   Troponin I  Result Value Ref Range   Troponin I <0.03 <0.03 ng/mL  Lactic acid, plasma  Result Value Ref Range   Lactic Acid, Venous 2.9 (HH) 0.5 - 1.9 mmol/L    Dg Chest 2 View Result Date: 02/09/2017 CLINICAL DATA:  Dementia. The patient was found after being missing for 3 days. Hypertension. 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 Prominence of the contour of the ascending aorta, ascending aortic aneurysm not excluded. Thoracic spondylosis. Mildly low lung volumes. Atherosclerotic aortic arch. No pleural effusion. IMPRESSION: 1. Prominent contour of the ascending aorta, ascending aortic aneurysm is not excluded. 2.  Aortic Atherosclerosis (ICD10-I70.0). 3. Thoracic spondylosis. 4. No acute  findings. Electronically Signed   By: Van Clines M.D.   On: 02/09/2017 15:11   Ct Head Wo Contrast Result Date: 02/09/2017 CLINICAL DATA:  Pt with hx of dementia, Missing 3 days last seen 12/19 [place] the woods on [address] Pt from [place] Hx of HTN, hydrocephalus EXAM: CT HEAD WITHOUT CONTRAST CT CERVICAL SPINE WITHOUT CONTRAST TECHNIQUE: Multidetector CT imaging of the head and cervical spine was performed following the standard protocol without intravenous contrast. Multiplanar CT image reconstructions of the cervical spine were also generated. COMPARISON:  Brain MRI and head CT, 02/21/2010 FINDINGS: CT HEAD FINDINGS Brain: No evidence of acute infarction, hemorrhage, hydrocephalus, extra-axial collection or mass lesion/mass effect. There is a right ventriculostomy catheter, tip projecting in the mid body of the right lateral ventricle. It enters the the posterior right frontal bone. Patient has had a previous suboccipital craniotomy. There is encephalomalacia along the medial left cerebellum. These findings are stable. Patchy white matter hypoattenuation is also noted consistent with chronic microvascular ischemic change, increased when compared to the prior CT. Vascular: No hyperdense vessel or unexpected calcification. Skull: No acute skull fracture.  No skull lesion. Sinuses/Orbits: Globes and orbits are unremarkable. Visualized sinuses and mastoid air cells are clear. Other: None CT CERVICAL SPINE FINDINGS Alignment: Normal. Skull base and vertebrae: Images from a previous suboccipital craniotomy. No acute fracture. No bone lesion. Soft tissues and spinal canal: Choose 1 Disc levels: There is moderate loss disc height from C2-C3 3 C5-C6. There is mild spondylotic disc bulging at these levels, but no convincing disc herniation. Uncovertebral spurring leads to moderate neural foraminal narrowing on the  right at C4-C5 and C5-C6. There is narrowing of the central spinal canal to 9 mm,  anterior to posterior, greatest at C4-C5. Upper chest: No masses or adenopathy. No acute findings. Lung apices are clear. Other: None IMPRESSION: HEAD CT 1. No acute intracranial abnormalities. 2. Well-positioned right ventriculostomy catheter. No hydrocephalus. 3. Stable changes from a prior suboccipital craniotomy CERVICAL CT 1. No fracture or acute finding. Electronically Signed   By: Lajean Manes M.D.   On: 02/09/2017 15:11   Ct Cervical Spine Wo Contrast Result Date: 02/09/2017 CLINICAL DATA:  Pt with hx of dementia, Missing 3 days last seen 12/19 [place] the woods on [address] Pt from [place] Hx of HTN, hydrocephalus EXAM: CT HEAD WITHOUT CONTRAST CT CERVICAL SPINE WITHOUT CONTRAST TECHNIQUE: Multidetector CT imaging of the head and cervical spine was performed following the standard protocol without intravenous contrast. Multiplanar CT image reconstructions of the cervical spine were also generated. COMPARISON:  Brain MRI and head CT, 02/21/2010 FINDINGS: CT HEAD FINDINGS Brain: No evidence of acute infarction, hemorrhage, hydrocephalus, extra-axial collection or mass lesion/mass effect. There is a right ventriculostomy catheter, tip projecting in the mid body of the right lateral ventricle. It enters the the posterior right frontal bone. Patient has had a previous suboccipital craniotomy. There is encephalomalacia along the medial left cerebellum. These findings are stable. Patchy white matter hypoattenuation is also noted consistent with chronic microvascular ischemic change, increased when compared to the prior CT. Vascular: No hyperdense vessel or unexpected calcification. Skull: No acute skull fracture.  No skull lesion. Sinuses/Orbits: Globes and orbits are unremarkable. Visualized sinuses and mastoid air cells are clear. Other: None CT CERVICAL SPINE FINDINGS Alignment: Normal. Skull base and vertebrae: Images from a previous suboccipital craniotomy. No acute fracture. No bone lesion. Soft  tissues and spinal canal: Choose 1 Disc levels: There is moderate loss disc height from C2-C3 3 C5-C6. There is mild spondylotic disc bulging at these levels, but no convincing disc herniation. Uncovertebral spurring leads to moderate neural foraminal narrowing on the right at C4-C5 and C5-C6. There is narrowing of the central spinal canal to 9 mm, anterior to posterior, greatest at C4-C5. Upper chest: No masses or adenopathy. No acute findings. Lung apices are clear. Other: None IMPRESSION: HEAD CT 1. No acute intracranial abnormalities. 2. Well-positioned right ventriculostomy catheter. No hydrocephalus. 3. Stable changes from a prior suboccipital craniotomy CERVICAL CT 1. No fracture or acute finding. Electronically Signed   By: Lajean Manes M.D.   On: 02/09/2017 15:11    1550:   IVF given for elevated BUN/Cr and CK total. Pt continues to deny CP/SOB/abd pain. Temp increased with warming blanket. VS otherwise stable. Dx and testing d/w pt and family.  Questions answered.  Verb understanding, agreeable to admit.   T/C to [facility] Lorin Mercy, case discussed, including:  HPI, pertinent PM/SHx, VS/PE, dx testing, ED course and treatment:  Agreeable to observation admit.    Final Clinical Impressions(s) / ED Diagnoses   Final diagnoses:  None    ED Discharge Orders    None        Francine Graven, DO 02/11/17 [id number]

## 2017-02-09 NOTE — H&P (Addendum)
History and Physical    Jeffrey Huerta STM:[id number] DOB: 22-Sep-1945 DOA: 02/09/2017  PCP: Nolene Ebbs, MD Consultants:  Clabe Seal - neurology; [facility] Patient coming from:  Home - lives with wife; NOK: Wife, [phone]  Chief Complaint: found after being missing  HPI: Jeffrey Huerta is a 71 y.o. male with medical history significant of dementia and HTN presenting after having been missing for 3 days.  His wife and daughter report that he left their home and went to [employer] last Wednesday.  He got turned around and couldn't find his way home.  They eventually located him in [facility] in his car in [place]te woods this AM.  He was okay when they found him other than a little cold and hungry and dehydrated.  No prior similar episodes, although they knew he had dementia.  ED Course:  Found in his car in the woods after having been missing 3 days.  Shoes/socks wet, but not soiled.  Hypothermia on arrival, resolved.  Dehydration, mild rhabdomyolysis.    Review of Systems: As per HPI; otherwise review of systems reviewed and negative.   Ambulatory Status:  Ambulates without assistance  Past Medical History:  Diagnosis Date  . Aneurysm ([place])   . AVM (arteriovenous malformation)   . Dementia   . Hydrocephalus    VP shunt in place  . Hypertension     Past Surgical History:  Procedure Laterality Date  . VENTRICULOPERITONEAL SHUNT     prior to 2008    Social History   Socioeconomic History  . Marital status: Married    Spouse name: Not on file  . Number of children: 3  . Years of education: 10th  . Highest education level: Not on file  Social Needs  . Financial resource strain: Not on file  . Food insecurity - worry: Not on file  . Food insecurity - inability: Not on file  . Transportation needs - medical: Not on file  . Transportation needs - non-medical: Not on file  Occupational History  . Occupation: Retired  Tobacco Use  . Smoking status: Never Smoker  .  Smokeless tobacco: Never Used  Substance and Sexual Activity  . Alcohol use: No    Alcohol/week: 0.0 oz  . Drug use: No  . Sexual activity: Not on file  Other Topics Concern  . Not on file  Social History Narrative   Lives at home with wife.   Right-handed.   No caffeine use.    No Known Allergies  Family History  Problem Relation Age of Onset  . Stroke Mother   . Hypertension Mother     Prior to Admission medications   Medication Sig Start Date End Date Taking? Authorizing Provider  donepezil (ARICEPT) 10 MG tablet Take 1 tablet (10 mg total) by mouth at bedtime. Patient not taking: Reported on 02/09/2017 04/05/16   Ward Givens, NP  losartan (COZAAR) 50 MG tablet Take 50 mg by mouth.    [provider]  memantine (NAMENDA) 10 MG tablet Take 1 tablet (10 mg total) by mouth 2 (two) times daily. Patient not taking: Reported on 02/09/2017 04/05/16   Ward Givens, NP    Physical Exam: Vitals:   02/09/17 1330 02/09/17 1353 02/09/17 1440 02/09/17 1546  BP: 129/75   (!) 152/87  Pulse:    98  Resp: 18   18  Temp:   97.9 F (36.6 C)   TempSrc:   Oral   SpO2:  100%  98%  Weight:  Height:         General: Appears calm and comfortable and is NAD Eyes:  PERRL, EOMI, normal lids, iris ENT:  grossly normal hearing, lips & tongue, mmm Neck:  no LAD, masses or thyromegaly; no carotid bruits Cardiovascular:  RRR, no m/r/g. No LE edema.  Respiratory:   CTA bilaterally with no wheezes/rales/rhonchi.  Normal respiratory effort. Abdomen:  soft, NT, ND, NABS Skin:  no rash or induration seen on limited exam Musculoskeletal:  grossly normal tone BUE/BLE, good ROM, no bony abnormality Lower extremity:  No LE edema.  Limited foot exam with no ulcerations.  2+ distal pulses. Psychiatric:  grossly normal mood and affect, speech fluent and appropriate, AOx2 Neurologic:  CN 2-12 grossly intact, moves all extremities in coordinated fashion, sensation  intact    Radiological Exams on Admission: Dg Chest 2 View  Result Date: 02/09/2017 CLINICAL DATA:  Dementia. The patient was found after being missing for 3 days. Hypertension. 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 Prominence of the contour of the ascending aorta, ascending aortic aneurysm not excluded. Thoracic spondylosis. Mildly low lung volumes. Atherosclerotic aortic arch. No pleural effusion. IMPRESSION: 1. Prominent contour of the ascending aorta, ascending aortic aneurysm is not excluded. 2.  Aortic Atherosclerosis (ICD10-I70.0). 3. Thoracic spondylosis. 4. No acute findings. Electronically Signed   By: Van Clines M.D.   On: 02/09/2017 15:11   Ct Head Wo Contrast  Result Date: 02/09/2017 CLINICAL DATA:  Pt with hx of dementia, Missing 3 days last seen 12/19 Found in [place] the woods on [address] Pt from [place] Hx of HTN, hydrocephalus EXAM: CT HEAD WITHOUT CONTRAST CT CERVICAL SPINE WITHOUT CONTRAST TECHNIQUE: Multidetector CT imaging of the head and cervical spine was performed following the standard protocol without intravenous contrast. Multiplanar CT image reconstructions of the cervical spine were also generated. COMPARISON:  Brain MRI and head CT, 02/21/2010 FINDINGS: CT HEAD FINDINGS Brain: No evidence of acute infarction, hemorrhage, hydrocephalus, extra-axial collection or mass lesion/mass effect. There is a right ventriculostomy catheter, tip projecting in the mid body of the right lateral ventricle. It enters the the posterior right frontal bone. Patient has had a previous suboccipital craniotomy. There is encephalomalacia along the medial left cerebellum. These findings are stable. Patchy white matter hypoattenuation is also noted consistent with chronic microvascular ischemic change, increased when compared to the prior CT. Vascular: No hyperdense vessel or  unexpected calcification. Skull: No acute skull fracture.  No skull lesion. Sinuses/Orbits: Globes and orbits are unremarkable. Visualized sinuses and mastoid air cells are clear. Other: None CT CERVICAL SPINE FINDINGS Alignment: Normal. Skull base and vertebrae: Images from a previous suboccipital craniotomy. No acute fracture. No bone lesion. Soft tissues and spinal canal: Choose 1 Disc levels: There is moderate loss disc height from C2-C3 3 C5-C6. There is mild spondylotic disc bulging at these levels, but no convincing disc herniation. Uncovertebral spurring leads to moderate neural foraminal narrowing on the right at C4-C5 and C5-C6. There is narrowing of the central spinal canal to 9 mm, anterior to posterior, greatest at C4-C5. Upper chest: No masses or adenopathy. No acute findings. Lung apices are clear. Other: None IMPRESSION: HEAD CT 1. No acute intracranial abnormalities. 2. Well-positioned right ventriculostomy catheter. No hydrocephalus. 3. Stable changes from a prior suboccipital craniotomy CERVICAL CT 1. No fracture or acute finding. Electronically Signed   By: Dedra Skeens.D.  On: 02/09/2017 15:11   Ct Cervical Spine Wo Contrast  Result Date: 02/09/2017 CLINICAL DATA:  Pt with hx of dementia, Missing 3 days last seen 12/19 Found in car [place] the woods on [address] Pt from [place] Hx of HTN, hydrocephalus EXAM: CT HEAD WITHOUT CONTRAST CT CERVICAL SPINE WITHOUT CONTRAST TECHNIQUE: Multidetector CT imaging of the head and cervical spine was performed following the standard protocol without intravenous contrast. Multiplanar CT image reconstructions of the cervical spine were also generated. COMPARISON:  Brain MRI and head CT, 02/21/2010 FINDINGS: CT HEAD FINDINGS Brain: No evidence of acute infarction, hemorrhage, hydrocephalus, extra-axial collection or mass lesion/mass effect. There is a right ventriculostomy catheter, tip projecting in the mid body of the right lateral ventricle. It enters the  the posterior right frontal bone. Patient has had a previous suboccipital craniotomy. There is encephalomalacia along the medial left cerebellum. These findings are stable. Patchy white matter hypoattenuation is also noted consistent with chronic microvascular ischemic change, increased when compared to the prior CT. Vascular: No hyperdense vessel or unexpected calcification. Skull: No acute skull fracture.  No skull lesion. Sinuses/Orbits: Globes and orbits are unremarkable. Visualized sinuses and mastoid air cells are clear. Other: None CT CERVICAL SPINE FINDINGS Alignment: Normal. Skull base and vertebrae: Images from a previous suboccipital craniotomy. No acute fracture. No bone lesion. Soft tissues and spinal canal: Choose 1 Disc levels: There is moderate loss disc height from C2-C3 3 C5-C6. There is mild spondylotic disc bulging at these levels, but no convincing disc herniation. Uncovertebral spurring leads to moderate neural foraminal narrowing on the right at C4-C5 and C5-C6. There is narrowing of the central spinal canal to 9 mm, anterior to posterior, greatest at C4-C5. Upper chest: No masses or adenopathy. No acute findings. Lung apices are clear. Other: None IMPRESSION: HEAD CT 1. No acute intracranial abnormalities. 2. Well-positioned right ventriculostomy catheter. No hydrocephalus. 3. Stable changes from a prior suboccipital craniotomy CERVICAL CT 1. No fracture or acute finding. Electronically Signed   By: Lajean Manes M.D.   On: 02/09/2017 15:11    EKG: Independently reviewed.  NSR with rate 96; nonspecific ST changes with no evidence of acute ischemia   Labs on Admission: I have personally reviewed the available labs and imaging studies at the time of the admission.  Pertinent labs:   Na++ 146 CO2 19 BUN 52/Creatinine 1.34/GFR 59; 14/1.15/>60 in 1/12 Anion gap 19 Bili 1.5 CK 1796 Troponin <0.03 CBC WNL UA: moderate Hgb, 20 ketones, 30 protein Urine culture  pending  Assessment/Plan Principal Problem:   Dehydration Active Problems:   Dementia   S/P VP shunt   Rhabdomyolysis   Dehydration -Patient located after having been found in his car in the woods for several days -He had hypothermia which resolved with a [employer] -He has minimal AKI and an elevated lactate as well as minimally elevated Na++, minimally low CO2, minimally elevated anion gap and bilirubin. -He did have mild rhabdo based on elevated CK but denies myalgias. -Lactic acid level is increased but he does not have any apparent source of infection at this time. -This is all likely related to mild dehydration from decreased PO intake and exposure over the last few days. -Patient is alert, pleasant, and without complaint. -Anticipate overnight rehydration will be all that is needed to restore him to health. -Anticipate d/c to home tomorrow.  Dementia -This is the more serious problem -Patient is already on Aricept and Namenda -He has a caring family that is  prepared to continue to care for him at home -They are now aware that he is unable to drive and will be careful to ensure that he does not have access to the keys in the future  VP shunt -Appears to be in place and functioning properly based on imaging at this time   DVT prophylaxis: Lovenox  Code Status: Full - confirmed with family Family Communication: Wife and daughter present throughout evaluation Disposition Plan:  Home once clinically improved Consults called: None  Admission status: It is my clinical opinion that referral for OBSERVATION is reasonable and necessary in this patient based on the above information provided. The aforementioned taken together are felt to place the patient at high risk for further clinical deterioration. However it is anticipated that the patient may be medically stable for discharge from the hospital within 24 to 48 hours.    Karmen Bongo MD [facility]  If note is  complete, please contact covering daytime or nighttime physician. [email or web address] Password [facility]  02/09/2017, 4:44 PM

## 2017-02-09 NOTE — ED Notes (Signed)
Family at bedside. 

## 2017-02-10 DIAGNOSIS — F0280 Dementia in other diseases classified elsewhere without behavioral disturbance: Secondary | ICD-10-CM | POA: Diagnosis not present

## 2017-02-10 DIAGNOSIS — G309 Alzheimer's disease, unspecified: Secondary | ICD-10-CM

## 2017-02-10 DIAGNOSIS — M6282 Rhabdomyolysis: Secondary | ICD-10-CM | POA: Diagnosis not present

## 2017-02-10 DIAGNOSIS — Z982 Presence of cerebrospinal fluid drainage device: Secondary | ICD-10-CM

## 2017-02-10 DIAGNOSIS — E860 Dehydration: Secondary | ICD-10-CM

## 2017-02-10 LAB — CK TOTAL AND CKMB (NOT AT ARMC)
CK, MB: 18.5 ng/mL — AB (ref 0.5–5.0)
Relative Index: 1 (ref 0.0–2.5)
Total CK: 1796 U/L — ABNORMAL HIGH (ref 49–397)

## 2017-02-10 LAB — BASIC METABOLIC PANEL
ANION GAP: 11 (ref 5–15)
BUN: 32 mg/dL — ABNORMAL HIGH (ref 6–20)
CHLORIDE: 110 mmol/L (ref 101–111)
CO2: 21 mmol/L — AB (ref 22–32)
CREATININE: 1.13 mg/dL (ref 0.61–1.24)
Calcium: 8.1 mg/dL — ABNORMAL LOW (ref 8.9–10.3)
GFR calc non Af Amer: >60 mL/min (ref >60)
Glucose, Bld: 87 mg/dL (ref 65–99)
POTASSIUM: 3.4 mmol/L — AB (ref 3.5–5.1)
SODIUM: 142 mmol/L (ref 135–145)

## 2017-02-10 LAB — CBC
HCT: 37.1 % — ABNORMAL LOW (ref 39.0–52.0)
HEMOGLOBIN: 11.7 g/dL — AB (ref 13.0–17.0)
MCH: 27.9 pg (ref 26.0–34.0)
MCHC: 31.5 g/dL (ref 30.0–36.0)
MCV: 88.5 fL (ref 78.0–100.0)
PLATELETS: 164 10*3/uL (ref 150–400)
RBC: 4.19 MIL/uL — AB (ref 4.22–5.81)
RDW: 13.9 % (ref 11.5–15.5)
WBC: 7.2 10*3/uL (ref 4.0–10.5)

## 2017-02-10 LAB — CK: Total CK: 2048 U/L — ABNORMAL HIGH (ref 49–397)

## 2017-02-10 LAB — LACTIC ACID, PLASMA: Lactic Acid, Venous: 0.8 mmol/L (ref 0.5–1.9)

## 2017-02-10 LAB — MAGNESIUM: MAGNESIUM: 2.2 mg/dL (ref 1.7–2.4)

## 2017-02-10 MED ORDER — POTASSIUM CHLORIDE IN NACL 40-0.9 MEQ/L-% IV SOLN
INTRAVENOUS | Status: AC
  Administered 2017-02-10: 125 mL/h via INTRAVENOUS

## 2017-02-10 MED ORDER — POTASSIUM CHLORIDE CRYS ER 20 MEQ PO TBCR: 40.0000 meq | EXTENDED_RELEASE_TABLET | Freq: Once | ORAL | Status: DC

## 2017-02-10 MED ORDER — PHENOL 1.4 % MT LIQD
1.0000 | OROMUCOSAL | Status: DC | PRN
  Administered 2017-02-10: 1 via OROMUCOSAL
  Filled 2017-02-10: qty 177

## 2017-02-10 MED ORDER — DEXTROSE 5 % IV SOLN: INTRAVENOUS | Status: DC

## 2017-02-10 NOTE — Progress Notes (Signed)
Initial Nutrition Assessment  DOCUMENTATION CODES:     INTERVENTION:  Ensure Enlive po BID, each supplement provides 350 kcal and 20 grams of protein    NUTRITION DIAGNOSIS:  Inadequate oral intake related to dementia as evidenced by patient missing 3 days without food and presented to ED dehydrated.  GOAL: Pt to meet >/= 90% of their estimated nutrition needs     MONITOR: Po intake, labs and wt trends     REASON FOR ASSESSMENT:   Malnutrition Screening Tool    ASSESSMENT:  The patient is a 71 yo male with a hx of dementia and HTN. He was found by police after being lost for 3 days. Presents dehydrated. His appetite is very good 100% of meals consumed. He is being discharged home. His weight is down- acutely by 8% which is significant for timeframe.     Recent Labs  Lab 02/09/17 1252 02/10/17 0652 02/10/17 0702  NA 146* 142  --   K 3.8 3.4*  --   CL 108 110  --   CO2 19* 21*  --   BUN 52* 32*  --   CREATININE 1.34* 1.13  --   CALCIUM 8.9 8.1*  --   MG  --   --  2.2  GLUCOSE 90 87  --    Labs and meds reviewed.    NUTRITION - FOCUSED PHYSICAL EXAM: unable to complete -pt d/c'd  Diet Order:  Diet regular Room service appropriate? Yes; Fluid consistency: Thin  EDUCATION NEEDS:   Skin:   no known issues  Last BM:   unknown  Height:   Ht Readings from Last 1 Encounters:  02/09/17 5\' 8"  (1.727 m)    Weight:   Wt Readings from Last 1 Encounters:  02/09/17 137 lb 14.4 oz (62.6 kg)    Ideal Body Weight:   70 kg  BMI:  Body mass index is 20.97 kg/m.  Estimated Nutritional Needs:   Kcal:   2979-8921 (to prevent further weight loss)  Protein:   75-82 gr  Fluid:   1.9-2.1 liters daily   Colman Cater MS,RD,CSG,LDN Office: [phone] Pager: [phone]

## 2017-02-10 NOTE — Discharge Summary (Signed)
Physician Discharge Summary  Jeffrey Huerta OEU:[id number] DOB: 12-Jul-1945 DOA: 02/09/2017  PCP: Jeffrey Ebbs, MD  Admit date: 02/09/2017 Discharge date: 02/10/2017  Admitted From: Home  Disposition: Home with 24/7 caregivers   Recommendations for Outpatient Follow-up:  1. Follow up with PCP in 1 weeks 2. Do not drive or operate machinery. 3. Please obtain BMP/CBC in one week  Discharge Condition: STABLE   CODE STATUS: FULL    Brief Hospitalization Summary: Please see all hospital notes, images, labs for full details of the hospitalization. HPI: Jeffrey Huerta is a 71 y.o. male with medical history significant of dementia and HTN presenting after having been missing for 3 days.  His wife and daughter report that he left their home and went to [employer] last Wednesday.  He got turned around and couldn't find his way home.  They eventually located him in [facility] in his car in [place]te woods this AM.  He was okay when they found him other than a little cold and hungry and dehydrated.  No prior similar episodes, although they knew he had dementia.  ED Course:  Found in his car in the woods after having been missing 3 days.  Shoes/socks wet, but not soiled.  Hypothermia on arrival, resolved.  Dehydration, mild rhabdomyolysis.  Hospital course: The patient was admitted and started on hugger for warmth and that did improve his temperature to normal.  He was given IV fluid hydration with potassium.  He tolerated this very well.  His family stayed with him.  His blood pressure improved to normal.  He began eating and drinking well with no difficulties.  He has been urinating well.  He has had a bowel movement.  Given that he is been eating and drinking well I feel like it is safe for him to discharge home.  He is nearly 2.15 L positive since admission.  His family members are at the bedside 24/7 and plan to care for him at home around the clock.  The patient and family given instructions that he is  not to drive or operate machinery and they verbalized understanding.  He should follow-up with his primary care physician in a week.  He also has a neurology follow-up scheduled.  The family was given instructions to return if symptoms recur, worsen or new problems develop and they verbalized understanding.  Discharge Diagnoses:  Principal Problem:   Dehydration Active Problems:   Dementia   S/P VP shunt   Rhabdomyolysis  Discharge Instructions: Discharge Instructions    Call MD for:  extreme fatigue   Complete by:  As directed    Call MD for:  persistant dizziness or light-headedness   Complete by:  As directed    Call MD for:  severe uncontrolled pain   Complete by:  As directed    Increase activity slowly   Complete by:  As directed      Allergies as of 02/10/2017   No Known Allergies     Medication List    TAKE these medications   donepezil 10 MG tablet Commonly known as:  ARICEPT Take 1 tablet (10 mg total) by mouth at bedtime.   losartan 50 MG tablet Commonly known as:  COZAAR Take 50 mg by mouth.   memantine 10 MG tablet Commonly known as:  NAMENDA Take 1 tablet (10 mg total) by mouth 2 (two) times daily.      Follow-up Information    Jeffrey Ebbs, MD. Schedule an appointment as soon as possible for a  visit in 1 week(s).   Specialty:  Internal Medicine Why:  Hospital follow-up Contact information: [address] [address] [phone]          No Known Allergies Allergies as of 02/10/2017   No Known Allergies     Medication List    TAKE these medications   donepezil 10 MG tablet Commonly known as:  ARICEPT Take 1 tablet (10 mg total) by mouth at bedtime.   losartan 50 MG tablet Commonly known as:  COZAAR Take 50 mg by mouth.   memantine 10 MG tablet Commonly known as:  NAMENDA Take 1 tablet (10 mg total) by mouth 2 (two) times daily.       Procedures/Studies: Dg Chest 2 View  Result Date: 02/09/2017 CLINICAL  DATA:  Dementia. The patient was found after being missing for 3 days. Hypertension. 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 Prominence of the contour of the ascending aorta, ascending aortic aneurysm not excluded. Thoracic spondylosis. Mildly low lung volumes. Atherosclerotic aortic arch. No pleural effusion. IMPRESSION: 1. Prominent contour of the ascending aorta, ascending aortic aneurysm is not excluded. 2.  Aortic Atherosclerosis (ICD10-I70.0). 3. Thoracic spondylosis. 4. No acute findings. Electronically Signed   By: Van Clines M.D.   On: 02/09/2017 15:11   Ct Head Wo Contrast  Result Date: 02/09/2017 CLINICAL DATA:  Pt with hx of dementia, Missing 3 days last seen 12/19 [place] the woods on [address] Pt from [place] Hx of HTN, hydrocephalus EXAM: CT HEAD WITHOUT CONTRAST CT CERVICAL SPINE WITHOUT CONTRAST TECHNIQUE: Multidetector CT imaging of the head and cervical spine was performed following the standard protocol without intravenous contrast. Multiplanar CT image reconstructions of the cervical spine were also generated. COMPARISON:  Brain MRI and head CT, 02/21/2010 FINDINGS: CT HEAD FINDINGS Brain: No evidence of acute infarction, hemorrhage, hydrocephalus, extra-axial collection or mass lesion/mass effect. There is a right ventriculostomy catheter, tip projecting in the mid body of the right lateral ventricle. It enters the the posterior right frontal bone. Patient has had a previous suboccipital craniotomy. There is encephalomalacia along the medial left cerebellum. These findings are stable. Patchy white matter hypoattenuation is also noted consistent with chronic microvascular ischemic change, increased when compared to the prior CT. Vascular: No hyperdense vessel or unexpected calcification. Skull: No acute skull fracture.  No skull lesion. Sinuses/Orbits: Globes and orbits  are unremarkable. Visualized sinuses and mastoid air cells are clear. Other: None CT CERVICAL SPINE FINDINGS Alignment: Normal. Skull base and vertebrae: Images from a previous suboccipital craniotomy. No acute fracture. No bone lesion. Soft tissues and spinal canal: Choose 1 Disc levels: There is moderate loss disc height from C2-C3 3 C5-C6. There is mild spondylotic disc bulging at these levels, but no convincing disc herniation. Uncovertebral spurring leads to moderate neural foraminal narrowing on the right at C4-C5 and C5-C6. There is narrowing of the central spinal canal to 9 mm, anterior to posterior, greatest at C4-C5. Upper chest: No masses or adenopathy. No acute findings. Lung apices are clear. Other: None IMPRESSION: HEAD CT 1. No acute intracranial abnormalities. 2. Well-positioned right ventriculostomy catheter. No hydrocephalus. 3. Stable changes from a prior suboccipital craniotomy CERVICAL CT 1. No fracture or acute finding. Electronically Signed   By: Lajean Manes M.D.   On: 02/09/2017 15:11   Ct Cervical Spine Wo Contrast  Result Date: 02/09/2017 CLINICAL DATA:  Pt with hx of dementia, Missing 3 days last seen 12/19 Found in [place] the woods on [address] Pt from [place] Hx of HTN, hydrocephalus EXAM: CT HEAD WITHOUT CONTRAST CT CERVICAL SPINE WITHOUT CONTRAST TECHNIQUE: Multidetector CT imaging of the head and cervical spine was performed following the standard protocol without intravenous contrast. Multiplanar CT image reconstructions of the cervical spine were also generated. COMPARISON:  Brain MRI and head CT, 02/21/2010 FINDINGS: CT HEAD FINDINGS Brain: No evidence of acute infarction, hemorrhage, hydrocephalus, extra-axial collection or mass lesion/mass effect. There is a right ventriculostomy catheter, tip projecting in the mid body of the right lateral ventricle. It enters the the posterior right frontal bone. Patient has had a previous suboccipital craniotomy. There is  encephalomalacia along the medial left cerebellum. These findings are stable. Patchy white matter hypoattenuation is also noted consistent with chronic microvascular ischemic change, increased when compared to the prior CT. Vascular: No hyperdense vessel or unexpected calcification. Skull: No acute skull fracture.  No skull lesion. Sinuses/Orbits: Globes and orbits are unremarkable. Visualized sinuses and mastoid air cells are clear. Other: None CT CERVICAL SPINE FINDINGS Alignment: Normal. Skull base and vertebrae: Images from a previous suboccipital craniotomy. No acute fracture. No bone lesion. Soft tissues and spinal canal: Choose 1 Disc levels: There is moderate loss disc height from C2-C3 3 C5-C6. There is mild spondylotic disc bulging at these levels, but no convincing disc herniation. Uncovertebral spurring leads to moderate neural foraminal narrowing on the right at C4-C5 and C5-C6. There is narrowing of the central spinal canal to 9 mm, anterior to posterior, greatest at C4-C5. Upper chest: No masses or adenopathy. No acute findings. Lung apices are clear. Other: None IMPRESSION: HEAD CT 1. No acute intracranial abnormalities. 2. Well-positioned right ventriculostomy catheter. No hydrocephalus. 3. Stable changes from a prior suboccipital craniotomy CERVICAL CT 1. No fracture or acute finding. Electronically Signed   By: Lajean Manes M.D.   On: 02/09/2017 15:11      Subjective: Pt has been doing well.  He denies complaints.  He is eating and drinking well and would like more.   His family is with him and he wants to go home.  They committed to being with him 24/7.     Discharge Exam: Vitals:   02/10/17 0627 02/10/17 1400  BP: 137/74 116/67  Pulse: 68 72  Resp: 20 20  Temp: 99 F (37.2 C) 99.1 F (37.3 C)  SpO2: 100% 100%   Vitals:   02/09/17 1828 02/09/17 1936 02/10/17 0627 02/10/17 1400  BP: (!) 168/84 (!) 142/72 137/74 116/67  Pulse: 95 82 68 72  Resp: 18 20 20 20   Temp: 97.8 F  (36.6 C) 99.1 F (37.3 C) 99 F (37.2 C) 99.1 F (37.3 C)  TempSrc: Oral Oral Oral Oral  SpO2: 100% 100% 100% 100%  Weight: 62.6 kg (137 lb 14.4 oz)     Height: 5\' 8"  (1.727 m)      General: Pt is alert, awake, not in acute distress Cardiovascular: RRR, S1/S2 +, no rubs, no gallops Respiratory: CTA bilaterally, no wheezing, no rhonchi Abdominal: Soft, NT, ND, bowel sounds + Extremities: no edema, no cyanosis Neurological: nonfocal.  Psychiatric: Pt is demented.    The results of significant diagnostics from this hospitalization (including imaging, microbiology, ancillary and laboratory) are listed below for reference.     Microbiology: No results found for this or any previous visit (from the past 240 hour(s)).   Labs: BNP (last 3 results)  No results for input(s): BNP in the last 8760 hours. Basic Metabolic Panel: Recent Labs  Lab 02/09/17 1252 02/10/17 0652 02/10/17 0702  NA 146* 142  --   K 3.8 3.4*  --   CL 108 110  --   CO2 19* 21*  --   GLUCOSE 90 87  --   BUN 52* 32*  --   CREATININE 1.34* 1.13  --   CALCIUM 8.9 8.1*  --   MG  --   --  2.2   Liver Function Tests: Recent Labs  Lab 02/09/17 1252  AST 71*  ALT 25  ALKPHOS 51  BILITOT 1.5*  PROT 7.8  ALBUMIN 4.1   No results for input(s): LIPASE, AMYLASE in the last 168 hours. No results for input(s): AMMONIA in the last 168 hours. CBC: Recent Labs  Lab 02/09/17 1252 02/10/17 0652  WBC 9.6 7.2  NEUTROABS 7.9*  --   HGB 13.8 11.7*  HCT 45.7 37.1*  MCV 89.6 88.5  PLT 196 164   Cardiac Enzymes: Recent Labs  Lab 02/09/17 1252 02/10/17 0652  CKTOTAL 1,796* 2,048*  CKMB 18.5*  --   TROPONINI <0.03  --    BNP: Invalid input(s): POCBNP CBG: No results for input(s): GLUCAP in the last 168 hours. D-Dimer No results for input(s): DDIMER in the last 72 hours. Hgb A1c No results for input(s): HGBA1C in the last 72 hours. Lipid Profile No results for input(s): CHOL, HDL, LDLCALC, TRIG,  CHOLHDL, LDLDIRECT in the last 72 hours. Thyroid function studies No results for input(s): TSH, T4TOTAL, T3FREE, THYROIDAB in the last 72 hours.  Invalid input(s): FREET3 Anemia work up No results for input(s): VITAMINB12, FOLATE, FERRITIN, TIBC, IRON, RETICCTPCT in the last 72 hours. Urinalysis    Component Value Date/Time   COLORURINE YELLOW 02/09/2017 1442   APPEARANCEUR HAZY (A) 02/09/2017 1442   LABSPEC 1.023 02/09/2017 1442   PHURINE 5.0 02/09/2017 1442   GLUCOSEU NEGATIVE 02/09/2017 1442   HGBUR MODERATE (A) 02/09/2017 1442   BILIRUBINUR NEGATIVE 02/09/2017 1442   KETONESUR 20 (A) 02/09/2017 1442   PROTEINUR 30 (A) 02/09/2017 1442   UROBILINOGEN 0.2 02/21/2010 0534   NITRITE NEGATIVE 02/09/2017 1442   LEUKOCYTESUR NEGATIVE 02/09/2017 1442   Sepsis Labs Invalid input(s): PROCALCITONIN,  WBC,  LACTICIDVEN Microbiology No results found for this or any previous visit (from the past 240 hour(s)).  Time coordinating discharge:   SIGNED:  Irwin Brakeman, MD  [facility] 02/10/2017, 4:17 PM Pager [phone]  If 7PM-7AM, please contact night-coverage [email or web address] Password [email or web address]

## 2017-02-10 NOTE — Discharge Instructions (Signed)
Do not drive or operate machinery.    Follow with Primary MD  Nolene Ebbs, MD  and other consultant's as instructed your Hospitalist MD  Please get a complete blood count and chemistry panel checked by your Primary MD at your next visit, and again as instructed by your Primary MD.  Get Medicines reviewed and adjusted: Please take all your medications with you for your next visit with your Primary MD  Laboratory/radiological data: Please request your Primary MD to go over all hospital tests and procedure/radiological results at the follow up, please ask your Primary MD to get all Hospital records sent to his/her office.  In some cases, they will be blood work, cultures and biopsy results pending at the time of your discharge. Please request that your primary care M.D. follows up on these results.  Also Note the following: If you experience worsening of your admission symptoms, develop shortness of breath, life threatening emergency, suicidal or homicidal thoughts you must seek medical attention immediately by calling 911 or calling your MD immediately  if symptoms less severe.  You must read complete instructions/literature along with all the possible adverse reactions/side effects for all the Medicines you take and that have been prescribed to you. Take any new Medicines after you have completely understood and accpet all the possible adverse reactions/side effects.   Do not drive   Do not take more than prescribed Pain, Sleep and Anxiety Medications. It is not advisable to combine anxiety,sleep and pain medications without talking with your primary care practitioner  Special Instructions: If you have smoked or chewed Tobacco  in the last 2 yrs please stop smoking, stop any regular Alcohol  and or any Recreational drug use.  Wear Seat belts while driving.  Please note: You were cared for by a hospitalist during your hospital stay. Once you are discharged, your primary care physician  will handle any further medical issues. Please note that NO REFILLS for any discharge medications will be authorized once you are discharged, as it is imperative that you return to your primary care physician (or establish a relationship with a primary care physician if you do not have one) for your post hospital discharge needs so that they can reassess your need for medications and monitor your lab values.

## 2017-02-10 NOTE — Progress Notes (Signed)
Pt IV removed, WNL. D/C instructions given to pt and family member. Verbalized understanding. Pt going to eat supper and then family to transport home.

## 2017-02-11 LAB — URINE CULTURE

## 2017-02-20 ENCOUNTER — Telehealth: Payer: Self-pay | Admitting: *Deleted

## 2017-02-20 DIAGNOSIS — F0390 Unspecified dementia without behavioral disturbance: Secondary | ICD-10-CM

## 2017-02-20 NOTE — Telephone Encounter (Signed)
Received call from patient's wife, [name] on [place]. She stated that her husband left home a few days ago and was found in [facility]. She stated he was alright. She called his insurance company to get a [place] and was told a referral or order must be placed by the provider. This RN noted a [place] aid order was placed by Edman Circle NP in May 2018 and sent to [facility]. Asked [name] if she got a call from [facility] last May; she stated she never got a call. This RN advised will send her request to NP, and advised if she does not hear back in a week, she should call to check on referral.  Wife verbalized understanding, appreciation.

## 2017-03-06 NOTE — Telephone Encounter (Signed)
Spoke to patient's wife - an appt has been scheduled with Megan on 03/13/17.  They will arrive to the office at 8am for the 8:30am appt.

## 2017-03-06 NOTE — Telephone Encounter (Signed)
Pt's wife called back today, pt was evaluated by PCP. She said PCP advised that he needs to seen by neurologist due to him being missing for so long. Pt has appt with Dr Krista Blue in May, can he get worked in sooner. Please call to advise at [phone]

## 2017-03-11 ENCOUNTER — Telehealth: Payer: Self-pay | Admitting: Neurology

## 2017-03-11 NOTE — Telephone Encounter (Signed)
Alyse Low stopped buy from [place] today if patient's wife calls please push adults day care.   Patient is not covered but his insurance but Alvis Lemmings is going in three times a week. If any questions you can call Christy.

## 2017-03-13 ENCOUNTER — Encounter: Payer: Self-pay | Admitting: Adult Health

## 2017-03-13 ENCOUNTER — Ambulatory Visit: Payer: Medicare Other | Admitting: Adult Health

## 2017-03-13 VITALS — BP 127/83 | HR 62 | Ht 68.0 in | Wt 144.4 lb

## 2017-03-13 DIAGNOSIS — R413 Other amnesia: Secondary | ICD-10-CM | POA: Diagnosis not present

## 2017-03-13 NOTE — Patient Instructions (Signed)
Your Plan:  Continue Aricept and Namenda Consider Adult day Center If your symptoms worsen or you develop new symptoms please let us know.   Thank you for coming to see [place] at [facility]. I hope we have been able to provide you high quality care today.  You may receive a patient satisfaction survey over the next few weeks. We would appreciate your feedback and comments so that we may continue to improve ourselves and the health of our patients.

## 2017-03-13 NOTE — Progress Notes (Signed)
I have reviewed and agreed above plan. 

## 2017-03-13 NOTE — Progress Notes (Signed)
PATIENT: Jeffrey Huerta DOB: 03-26-1945  REASON FOR VISIT: follow up HISTORY FROM: patient  HISTORY OF PRESENT ILLNESS: Today 03/13/17 Jeffrey Huerta is a 72 year old male with a history of memory disturbance.  He returns today for follow-up.  He is here with his wife.  He reports in December he left in his car and was gone for 3 days.  She reports they found him and [facility].  She reports that this is been a very traumatic event for him.  She reports that he tends to do well on what he went through a lot.  He no longer wants to drive which is a good thing.  She reports he oftentimes just wants to stay at home and watch television.  She was considering an adult day center but she reports that she felt guilty for sending him away during the day.  He is able to complete most ADLs independently but does require some supervision.  His wife manages all the finances and prepares his meals and medications.  He returns today for an evaluation.  HISTORY 01/01/17 Jeffrey Huerta is a 72 year old male with a history of memory disturbance.  He returns today for follow-up.  His wife is with him.  She reports that they are considering sending him to an adult day center for 4-5 hours during the day.  She reports that she cannot get him to take his medication.  She reports that he is able to complete all ADLs independently however he often will skip baths despite her prompting him.  She reports that he has a good appetite.  She reports that he does drive on occasion.  Denies any trouble sleeping.  She does report that sometimes he gets up during the night but he does come back to bed.  A couple months ago the patient was having thoughts of harming himself and behavior changes.  His wife reports that that has resolved.  He returns today for an evaluation.  REVIEW OF SYSTEMS: Out of a complete 14 system review of symptoms, the patient complains only of the following symptoms, and all other reviewed systems are  negative.  See HPI  ALLERGIES: No Known Allergies  HOME MEDICATIONS: Outpatient Medications Prior to Visit  Medication Sig Dispense Refill  . donepezil (ARICEPT) 10 MG tablet Take 1 tablet (10 mg total) by mouth at bedtime. (Patient not taking: Reported on 02/09/2017) 90 tablet 4  . losartan (COZAAR) 50 MG tablet Take 50 mg by mouth.    . memantine (NAMENDA) 10 MG tablet Take 1 tablet (10 mg total) by mouth 2 (two) times daily. (Patient not taking: Reported on 02/09/2017) 180 tablet 4   No facility-administered medications prior to visit.     PAST MEDICAL HISTORY: Past Medical History:  Diagnosis Date  . Aneurysm ([place])   . AVM (arteriovenous malformation)   . Dementia   . Hydrocephalus    VP shunt in place  . Hypertension     PAST SURGICAL HISTORY: Past Surgical History:  Procedure Laterality Date  . VENTRICULOPERITONEAL SHUNT     prior to 2008    FAMILY HISTORY: Family History  Problem Relation Age of Onset  . Stroke Mother   . Hypertension Mother     SOCIAL HISTORY: Social History   Socioeconomic History  . Marital status: Married    Spouse name: Not on file  . Number of children: 3  . Years of education: 10th  . Highest education level: Not on file  Social Needs  .  Financial resource strain: Not on file  . Food insecurity - worry: Not on file  . Food insecurity - inability: Not on file  . Transportation needs - medical: Not on file  . Transportation needs - non-medical: Not on file  Occupational History  . Occupation: Retired  Tobacco Use  . Smoking status: Never Smoker  . Smokeless tobacco: Never Used  Substance and Sexual Activity  . Alcohol use: No    Alcohol/week: 0.0 oz  . Drug use: No  . Sexual activity: Not on file  Other Topics Concern  . Not on file  Social History Narrative   Lives at home with wife.   Right-handed.   No caffeine use.      PHYSICAL EXAM  Vitals:   03/13/17 0817  BP: 127/83  Pulse: 62  Weight: 144 lb 6.4  oz (65.5 kg)  Height: 5\' 8"  (1.727 m)   Body mass index is 21.96 kg/m.   MMSE - Mini Mental State Exam 03/13/2017 01/01/2017 06/24/2016  Orientation to time 1 1 0  Orientation to Place 4 4 4   Registration 3 1 3   Attention/ Calculation 0 0 0  Recall 0 0 0  Language- name 2 objects 2 2 2   Language- repeat 1 0 1  Language- follow 3 step command 3 1 3   Language- follow 3 step command-comments - folded twice, handed to RN -  Language- read & follow direction 1 1 1   Write a sentence 1 0 0  Copy design 1 1 1   Total score 17 11 15      Generalized: Well developed, in no acute distress   Neurological examination  Mentation: Alert oriented to time, place, history taking. Follows all commands speech and language fluent Cranial nerve II-XII: Pupils were equal round reactive to light. Extraocular movements were full, visual field were full on confrontational test. Facial sensation and strength were normal. Uvula tongue midline. Head turning and shoulder shrug  were normal and symmetric. Motor: The motor testing reveals 5 over 5 strength of all 4 extremities. Good symmetric motor tone is noted throughout.  Sensory: Sensory testing is intact to soft touch on all 4 extremities. No evidence of extinction is noted.  Coordination: Cerebellar testing reveals good finger-nose-finger and heel-to-shin bilaterally.  Gait and station: Gait is normal. Reflexes: Deep tendon reflexes are symmetric and normal bilaterally.   DIAGNOSTIC DATA (LABS, IMAGING, TESTING) - I reviewed patient records, labs, notes, testing and imaging myself where available.  Lab Results  Component Value Date   WBC 7.2 02/10/2017   HGB 11.7 (L) 02/10/2017   HCT 37.1 (L) 02/10/2017   MCV 88.5 02/10/2017   PLT 164 02/10/2017      Component Value Date/Time   NA 142 02/10/2017 0652   K 3.4 (L) 02/10/2017 0652   CL 110 02/10/2017 0652   CO2 21 (L) 02/10/2017 0652   GLUCOSE 87 02/10/2017 0652   BUN 32 (H) 02/10/2017 0652    CREATININE 1.13 02/10/2017 0652   CALCIUM 8.1 (L) 02/10/2017 0652   PROT 7.8 02/09/2017 1252   ALBUMIN 4.1 02/09/2017 1252   AST 71 (H) 02/09/2017 1252   ALT 25 02/09/2017 1252   ALKPHOS 51 02/09/2017 1252   BILITOT 1.5 (H) 02/09/2017 1252   GFRNONAA >60 02/10/2017 0652   GFRAA >60 02/10/2017 6546       ASSESSMENT AND PLAN 72 y.o. year old male  has a past medical history of Aneurysm ([place]), AVM (arteriovenous malformation), Dementia, Hydrocephalus, and Hypertension. here with :  1.  Memory disturbance  The patient's memory score has improved since the last visit.  He will continue on Aricept and Namenda.  I had a long discussion with the patient's wife regarding changing his home environment to ensure that he no longer wonders off or gets in the car to drive.  She reports that she has taken the keys away and she is changing the locks at home so he cannot leave during the night.  We also discussed the adult day center.  I discussed the benefits associated with this program.  He voiced understanding and is in agreement that this would be beneficial for the patient.  Advised that if his symptoms worsen or develop new symptoms they should let us know.  He will follow-up in 6 months or sooner if needed.    Ward Givens, MSN, NP-C 03/13/2017, 8:15 AM [facility] [address] [place], [place] [address] [phone]

## 2017-04-07 ENCOUNTER — Telehealth: Payer: Self-pay | Admitting: Adult Health

## 2017-04-07 NOTE — Telephone Encounter (Signed)
I spoke to wife again.  She relayed that the she did receive funding for a caregiver to come in 3 days a week from [facility].  She stated that he is on waitlist for [facility]. (funding and availability).

## 2017-04-07 NOTE — Telephone Encounter (Signed)
Noted  

## 2017-04-07 NOTE — Telephone Encounter (Signed)
I spoke to wife, [name].  Pt is not wanting to take any of his medications.  They have tried crumbling in food, (eggs grits) and he stops eating.  They have attempted to have others give to him (kids) and he refuses.  He states he does not need it.  I told her to try sweet things, (chocolate flavored: frosty's, ice cream, puddings, applesauce) and see how this goes.  There is another medication (exelon patch) which is another option.  She will try and let us know.

## 2017-04-07 NOTE — Telephone Encounter (Signed)
Pt wife(on DPR) has called to inform that pt has refused to take any of his medications.  Wife states when he first came home he would but now refuses.  Wife states they would try putting the meds in food, pt refused to eat for 2 days.  Wife states he is eating again but refuses to take medication.  Pt wife is asking to be called

## 2017-04-07 NOTE — Telephone Encounter (Signed)
Has she sent him up with adult day care? I recommended this at the last visit and they can give him his meds?

## 2017-07-01 ENCOUNTER — Encounter: Payer: Self-pay | Admitting: Neurology

## 2017-07-01 ENCOUNTER — Ambulatory Visit: Payer: Medicare Other | Admitting: Neurology

## 2017-07-01 VITALS — BP 132/83 | HR 64 | Ht 68.0 in | Wt 139.0 lb

## 2017-07-01 DIAGNOSIS — F0280 Dementia in other diseases classified elsewhere without behavioral disturbance: Secondary | ICD-10-CM | POA: Diagnosis not present

## 2017-07-01 DIAGNOSIS — G309 Alzheimer's disease, unspecified: Secondary | ICD-10-CM | POA: Diagnosis not present

## 2017-07-01 NOTE — Progress Notes (Signed)
PATIENT: Jeffrey Huerta DOB: Jan 07, 1946   HISTORY OF PRESENT ILLNESS: Jeffrey Huerta is a 72 years old right-handed male, accompanied by his wife, and daughter Jeffrey Huerta, seen in refer by neurosurgical nurse practitioner Graceann Congress for evaluation of memory loss on May third 2017, his primary care physician is Dr. Nolene Ebbs and nurse practitioner Vonna Drafts.  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48,  He presented with acute intracranial bleeding on November 5th 2006, was initially treated at [facility],  acute subarachnoid hemorrhage with associated intraventricular blood, evidence of obstructive hydrocephalus, there is associated cerebral edema and mass effect with obliteration of CSF space at the level of superacellar cistern and surrounding brainstem,  Four-vessel angiogram in December 24 2004, 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 Duplicated right middle cerebral artery, a normal developmental radiation.  He was later referred to neurosurgeon Dr. Redmond Pulling at [facility] for suboccipital craniotomy for resection of complex cerebellar AVM, including resecting aneurysm on the feeding vessels on May 15 2005, he underwent ventriculoperitoneal shunt placement in March 2007 for subsequent noncommunicating hydrocephalus,  He went through prolonged comprehensive rehabilitation, has to relearn walking feeding himself again  has made marked progress, he  is able to driving, continues to drive now, he was able to take care of his household includes paying the bill, fixing the cars for a while.   Since 2012, he was noted to have progressive worsening memory loss, his wife has to take care of the bill now, he become much less active, he tends to sitting the porch or his car all day long, get easily agitated at evening time, he has lost appetite,  He was put on Aricept 5 mg since March 2017, tolerating it well, no significant side effect notice, he has no gait difficulty,  He was recently reevaluated by neurosurgeon clinic at [facility],  CAT scan of the brain without contrast at [facility] in April 2017 no acute intracranial abnormality, right frontal approach ventriculoperitoneal shunt with no evidence of hydrocephalus, similar posterior fossa encephalomalacia status post left occipital craniotomy, redemonstrated right frontal lobe encephalomalacia, potentially reflecting sequelae of remote infarction  UPDATE Jul 01 2017: He drove off and got lost in Dec 2018, family eventually found him at [facility], he has stopped driving since then, he is now attending [facility] on a daily basis, doing very well, has good appetite, sleeps well, but he refused to take his medications,  Blood pressure is within normal range today,   REVIEW OF SYSTEMS: Out of a complete 14 system review of symptoms, the patient complains only of the following symptoms, and all other reviewed systems are negative.  Memory loss ALLERGIES: No Known Allergies  HOME MEDICATIONS: Outpatient Medications Prior to Visit  Medication Sig Dispense Refill  . donepezil (ARICEPT) 10 MG tablet Take 1 tablet (10 mg total) by mouth at bedtime. 90 tablet 4  . losartan (COZAAR) 50 MG tablet Take 50 mg by mouth.    . memantine (NAMENDA) 10 MG tablet Take 1 tablet (  10 mg total) by mouth 2 (two) times daily. 180 tablet 4   No facility-administered medications  prior to visit.     PAST MEDICAL HISTORY: Past Medical History:  Diagnosis Date  . Aneurysm ([place])   . AVM (arteriovenous malformation)   . Dementia   . Hydrocephalus    VP shunt in place  . Hypertension     PAST SURGICAL HISTORY: Past Surgical History:  Procedure Laterality Date  . VENTRICULOPERITONEAL SHUNT     prior to 2008    FAMILY HISTORY: Family History  Problem Relation Age of Onset  . Stroke Mother   . Hypertension Mother     SOCIAL HISTORY: Social History   Socioeconomic History  . Marital status: Married    Spouse name: Not on file  . Number of children: 3  . Years of education: 10th  . Highest education level: Not on file  Occupational History  . Occupation: Retired  Scientific laboratory technician  . Financial resource strain: Not on file  . Food insecurity:    Worry: Not on file    Inability: Not on file  . Transportation needs:    Medical: Not on file    Non-medical: Not on file  Tobacco Use  . Smoking status: Never Smoker  . Smokeless tobacco: Never Used  Substance and Sexual Activity  . Alcohol use: No    Alcohol/week: 0.0 oz  . Drug use: No  . Sexual activity: Not on file  Lifestyle  . Physical activity:    Days per week: Not on file    Minutes per session: Not on file  . Stress: Not on file  Relationships  . Social connections:    Talks on phone: Not on file    Gets together: Not on file    Attends religious service: Not on file    Active member of club or organization: Not on file    Attends meetings of clubs or organizations: Not on file    Relationship status: Not on file  . Intimate partner violence:    Fear of current or ex partner: Not on file    Emotionally abused: Not on file    Physically abused: Not on file    Forced sexual activity: Not on file  Other Topics Concern  . Not on file  Social History Narrative   Lives at home with wife.   Right-handed.   No caffeine use.      PHYSICAL EXAM  Vitals:   07/01/17 1046  BP:  132/83  Pulse: 64  Weight: 139 lb (63 kg)  Height: 5\' 8"  (1.727 m)   Body mass index is 21.13 kg/m.   MMSE - Mini Mental State Exam 03/13/2017 01/01/2017 06/24/2016  Orientation to time 1 1 0  Orientation to Place 4 4 4   Registration 3 1 3   Attention/ Calculation 0 0 0  Recall 0 0 0  Language- name 2 objects 2 2 2   Language- repeat 1 0 1  Language- follow 3 step command 3 1 3   Language- follow 3 step command-comments - folded twice, handed to RN -  Language- read & follow direction 1 1 1   Write a sentence 1 0 0  Copy design 1 1 1   Total score 17 11 15      Generalized: Well developed, in no acute distress   Neurological examination  Mentation: Alert oriented to time, place, history taking. Follows all commands speech and language fluent Cranial nerve II-XII: Pupils were equal round reactive to  light. Extraocular movements were full, visual field were full on confrontational test. Facial sensation and strength were normal. Uvula tongue midline. Head turning and shoulder shrug  were normal and symmetric. Motor: The motor testing reveals 5 over 5 strength of all 4 extremities. Good symmetric motor tone is noted throughout.  Sensory: Sensory testing is intact to soft touch on all 4 extremities. No evidence of extinction is noted.  Coordination: Cerebellar testing reveals good finger-nose-finger and heel-to-shin bilaterally.  Gait and station: Gait is normal. Reflexes: Deep tendon reflexes are symmetric and normal bilaterally.   DIAGNOSTIC DATA (LABS, IMAGING, TESTING) - I reviewed patient records, labs, notes, testing and imaging myself where available.  Lab Results  Component Value Date   WBC 7.2 02/10/2017   HGB 11.7 (L) 02/10/2017   HCT 37.1 (L) 02/10/2017   MCV 88.5 02/10/2017   PLT 164 02/10/2017      Component Value Date/Time   NA 142 02/10/2017 0652   K 3.4 (L) 02/10/2017 0652   CL 110 02/10/2017 0652   CO2 21 (L) 02/10/2017 0652   GLUCOSE 87 02/10/2017 0652   BUN  32 (H) 02/10/2017 0652   CREATININE 1.13 02/10/2017 0652   CALCIUM 8.1 (L) 02/10/2017 0652   PROT 7.8 02/09/2017 1252   ALBUMIN 4.1 02/09/2017 1252   AST 71 (H) 02/09/2017 1252   ALT 25 02/09/2017 1252   ALKPHOS 51 02/09/2017 1252   BILITOT 1.5 (H) 02/09/2017 1252   GFRNONAA >60 02/10/2017 0652   GFRAA >60 02/10/2017 0383       ASSESSMENT AND PLAN 72 y.o. year old male   Memory loss   Slow progressively worsening  Refused to take his medications   History of subarachnoid hemorrhage in November 2006,, status post resection of complex left cerebellar AV malformation in 2007, status post VP shunt placement     Marcial Pacas, M.D. Ph.D.  [facility] [place], [place] [address] Phone: [phone] Fax:      [phone]

## 2017-08-16 ENCOUNTER — Encounter (HOSPITAL_COMMUNITY): Payer: Self-pay | Admitting: Emergency Medicine

## 2017-08-16 ENCOUNTER — Emergency Department (HOSPITAL_COMMUNITY)
Admission: EM | Admit: 2017-08-16 | Discharge: 2017-08-16 | Disposition: A | Discharge status: Home / Self Care | Payer: Medicare Other | Attending: Emergency Medicine | Admitting: Emergency Medicine

## 2017-08-16 ENCOUNTER — Other Ambulatory Visit: Payer: Self-pay

## 2017-08-16 DIAGNOSIS — Z79899 Other long term (current) drug therapy: Secondary | ICD-10-CM | POA: Insufficient documentation

## 2017-08-16 DIAGNOSIS — T671XXA Heat syncope, initial encounter: Secondary | ICD-10-CM | POA: Insufficient documentation

## 2017-08-16 DIAGNOSIS — I10 Essential (primary) hypertension: Secondary | ICD-10-CM | POA: Insufficient documentation

## 2017-08-16 DIAGNOSIS — F0390 Unspecified dementia without behavioral disturbance: Secondary | ICD-10-CM | POA: Insufficient documentation

## 2017-08-16 DIAGNOSIS — R55 Syncope and collapse: Secondary | ICD-10-CM | POA: Diagnosis present

## 2017-08-16 LAB — BASIC METABOLIC PANEL
Anion gap: 8 (ref 5–15)
BUN: 17 mg/dL (ref 8–23)
CO2: 28 mmol/L (ref 22–32)
Calcium: 9.2 mg/dL (ref 8.9–10.3)
Chloride: 105 mmol/L (ref 98–111)
Creatinine, Ser: 1.21 mg/dL (ref 0.61–1.24)
GFR calc Af Amer: >60 mL/min (ref >60)
GFR calc non Af Amer: 58 mL/min — ABNORMAL LOW (ref >60)
Glucose, Bld: 118 mg/dL — ABNORMAL HIGH (ref 70–99)
Potassium: 4.6 mmol/L (ref 3.5–5.1)
Sodium: 141 mmol/L (ref 135–145)

## 2017-08-16 LAB — CBC WITH DIFFERENTIAL/PLATELET
Abs Immature Granulocytes: 0 10*3/uL (ref 0.0–0.1)
Basophils Absolute: 0 10*3/uL (ref 0.0–0.1)
Basophils Relative: 0 %
Eosinophils Absolute: 0 10*3/uL (ref 0.0–0.7)
Eosinophils Relative: 1 %
HCT: 44.2 % (ref 39.0–52.0)
Hemoglobin: 13.3 g/dL (ref 13.0–17.0)
Immature Granulocytes: 0 %
Lymphocytes Relative: 28 %
Lymphs Abs: 1.6 10*3/uL (ref 0.7–4.0)
MCH: 27.1 pg (ref 26.0–34.0)
MCHC: 30.1 g/dL (ref 30.0–36.0)
MCV: 90.2 fL (ref 78.0–100.0)
Monocytes Absolute: 0.5 10*3/uL (ref 0.1–1.0)
Monocytes Relative: 9 %
Neutro Abs: 3.6 10*3/uL (ref 1.7–7.7)
Neutrophils Relative %: 62 %
Platelets: 158 10*3/uL (ref 150–400)
RBC: 4.9 MIL/uL (ref 4.22–5.81)
RDW: 13.4 % (ref 11.5–15.5)
WBC: 5.7 10*3/uL (ref 4.0–10.5)

## 2017-08-16 LAB — URINALYSIS, ROUTINE W REFLEX MICROSCOPIC
Bilirubin Urine: NEGATIVE
Glucose, UA: NEGATIVE mg/dL
Hgb urine dipstick: NEGATIVE
Ketones, ur: NEGATIVE mg/dL
Leukocytes, UA: NEGATIVE
Nitrite: NEGATIVE
Protein, ur: NEGATIVE mg/dL
Specific Gravity, Urine: 1.02 (ref 1.005–1.030)
pH: 7 (ref 5.0–8.0)

## 2017-08-16 LAB — I-STAT TROPONIN, ED: Troponin i, poc: 0 ng/mL (ref 0.00–0.08)

## 2017-08-16 MED ORDER — SODIUM CHLORIDE 0.9 % IV BOLUS
500.0000 mL | Freq: Once | INTRAVENOUS | Status: AC
  Administered 2017-08-16: 500 mL via INTRAVENOUS

## 2017-08-16 NOTE — ED Triage Notes (Signed)
Pt had syncopal episode outside, was outside for 1 hour. Bystander saw pt slump over and started CPR without checking pulse. EMS arrived and pt BP 470 systolic when EMS arrived. [place]. Family states he was "unconscious for a few min" Ambulated to carport with EMS.

## 2017-08-16 NOTE — ED Provider Notes (Signed)
EMERGENCY DEPARTMENT Provider Note   CSN: [id number] Arrival date & time: 08/16/17  1139     History   Chief Complaint Chief Complaint  Patient presents with  . Loss of Consciousness    HPI Jeffrey Huerta is a 72 y.o. male.  HPI Patient presents to the emergency department with syncopal episode that occurred just prior to arrival.  The patient was sitting in a lawn chair directly in the sun.  The patient states that he likes to sit in the sun and enjoys this.  The patient's wife states that he would not sit under the shade and refused to sit anywhere but in the sun.  She states she checked on him every 5 minutes and when she checked on him last he seemed to be slumped over in the chair.  She states that he was drenched in sweat and had been sweating pretty profusely prior to this as well.  The patient told me that he got very hot and felt like he was overheating and that the last thing that he recalls.  The patient states that he did not have any issues other than feeling overheated prior to this event.  Patient states he has no complaints at this time.  The patient denies chest pain, shortness of breath, headache,blurred vision, neck pain, fever, cough, weakness, numbness, dizziness, anorexia, edema, abdominal pain, nausea, vomiting, diarrhea, rash, back pain, dysuria, hematemesis, bloody stool Past Medical History:  Diagnosis Date  . Aneurysm ([place])   . AVM (arteriovenous malformation)   . Dementia   . Hydrocephalus    VP shunt in place  . Hypertension     Patient Active Problem List   Diagnosis Date Noted  . Dehydration 02/09/2017  . Dementia 02/09/2017  . 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 Rhabdomyolysis 02/09/2017  . Memory loss 06/21/2015  . AVM (arteriovenous malformation) 06/21/2015    Past Surgical History:  Procedure Laterality Date  . VENTRICULOPERITONEAL SHUNT     prior to 2008        Home Medications    Prior to Admission medications     Medication Sig Start Date End Date Taking? Authorizing Provider  donepezil (ARICEPT) 10 MG tablet Take 1 tablet (10 mg total) by mouth at bedtime. 04/05/16   Ward Givens, NP  losartan (COZAAR) 50 MG tablet Take 50 mg by mouth.    [provider]  memantine (NAMENDA) 10 MG tablet Take 1 tablet (10 mg total) by mouth 2 (two) times daily. 04/05/16   Ward Givens, NP    Family History Family History  Problem Relation Age of Onset  . Stroke Mother   . Hypertension Mother     Social History Social History   Tobacco Use  . Smoking status: Never Smoker  . Smokeless tobacco: Never Used  Substance Use Topics  . Alcohol use: No    Alcohol/week: 0.0 oz  . Drug use: No     Allergies   Patient has no known allergies.   Review of Systems Review of Systems All other systems negative except as documented in the HPI. All pertinent positives and negatives as reviewed in the HPI.  Physical Exam Updated Vital Signs BP 126/80   Pulse 70   Temp 98.9 F (37.2 C) (Oral)   Resp 16   Ht 5\' 6"  (1.676 m)   Wt 63.5 kg (140 lb)   SpO2 100%   BMI 22.60 kg/m   Physical Exam  Constitutional: He is oriented to  person, place, and time. He appears well-developed and well-nourished. No distress.  HENT:  Head: Normocephalic and atraumatic.  Mouth/Throat: Oropharynx is clear and moist.  Eyes: Pupils are equal, round, and reactive to light.  Neck: Normal range of motion. Neck supple.  Cardiovascular: Normal rate, regular rhythm and normal heart sounds. Exam reveals no gallop and no friction rub.  No murmur heard. Pulmonary/Chest: Effort normal and breath sounds normal. No respiratory distress. He has no wheezes.  Abdominal: Soft. Bowel sounds are normal. He exhibits no distension. There is no tenderness.  Neurological: He is alert and oriented to person, place, and time. He exhibits normal muscle tone. Coordination normal.  Skin: Skin is warm and dry. Capillary refill takes less  than 2 seconds. No rash noted. No erythema.  Psychiatric: He has a normal mood and affect. His behavior is normal.  Nursing note and vitals reviewed.    ED Treatments / Results  Labs (all labs ordered are listed, but only abnormal results are displayed) Labs Reviewed  BASIC METABOLIC PANEL - Abnormal; Notable for the following components:      Result Value   Glucose, Bld 118 (*)    GFR calc non Af Amer 58 (*)    All other components within normal limits  CBC WITH DIFFERENTIAL/PLATELET  URINALYSIS, ROUTINE W REFLEX MICROSCOPIC  I-STAT TROPONIN, ED    EKG EKG Interpretation  Date/Time:  Saturday August 16 2017 11:42:08 EDT Ventricular Rate:  82 PR Interval:  148 QRS Duration: 84 QT Interval:  402 QTC Calculation: 469 R Axis:   40 Text Interpretation:  Normal sinus rhythm Nonspecific T wave abnormality Prolonged QT Abnormal ECG No significant change since last tracing Confirmed by Dorie Rank [phone]) on 08/16/2017 11:46:06 AM   Radiology No results found.  Procedures Procedures (including critical care time)  Medications Ordered in ED Medications  sodium chloride 0.9 % bolus 500 mL (500 mLs Intravenous New Bag/Given 08/16/17 1252)     Initial Impression / Assessment and Plan / ED Course  I have reviewed the triage vital signs and the nursing notes.  Pertinent labs & imaging results that were available during my care of the patient were reviewed by me and considered in my medical decision making (see chart for details).     The patient has not been orthostatic here in the emergency department he did receive IV fluids as well.  I have advised the patient he will need to follow-up with his primary doctor.  The patient would like to be discharged home.  The wife is also comfortable with this.  I did advise that if there is any worsening or change in his condition he would need to return here for a recheck.  I feel that the patient's syncopal episode was directly related to  sitting out in the sun today as the temperatures are in the 90s with high humidity. Final Clinical Impressions(s) / ED Diagnoses   Final diagnoses:  None    ED Discharge Orders    None       Dalia Heading, PA-C 08/16/17 1513    Dorie Rank, MD 08/16/17 1630

## 2017-08-16 NOTE — ED Notes (Signed)
Pt talking with provider and sings at intervals.

## 2017-08-16 NOTE — ED Notes (Signed)
Pt denies pain states Im fine" and sits up in bed. Family at bedsdie.

## 2017-08-16 NOTE — ED Notes (Signed)
D/c reviewed with patient and family 

## 2017-08-16 NOTE — Discharge Instructions (Addendum)
Return here as needed. Follow up with your primary doctor. Increase your fluid intake. °

## 2017-10-03 ENCOUNTER — Encounter: Payer: Self-pay | Admitting: *Deleted

## 2017-10-03 ENCOUNTER — Telehealth: Payer: Self-pay | Admitting: Neurology

## 2017-10-03 NOTE — Telephone Encounter (Signed)
Pts wife [name] called stating she has jury duty on 8/26 but due to the pts condition she is unable to leave him alone. Pts children all work full time jobs and are unable to help keep an eye on the pt. [name] requesting a call to discuss if Dr. Krista Blue will give her a note stating she can not attend jury duty.

## 2017-10-03 NOTE — Telephone Encounter (Signed)
Spoke with [name] and advised Dr. Krista Blue can provide a letter stating that she is the primary caregiver for her husband who has AD.  She verbalized understanding of same, would like to pick letter up in our office on Monday.  Letter up front [facility]/fim

## 2017-10-06 NOTE — Telephone Encounter (Signed)
Patient's wife contacted the office today about letter. I advised the letter was ready for pick up, Mrs. Janis verbalized understanding and will be by later today to pick up the letter.  MB RN.

## 2017-12-18 ENCOUNTER — Telehealth: Payer: Self-pay | Admitting: Neurology

## 2017-12-18 NOTE — Telephone Encounter (Signed)
Left message requesting a return call.

## 2017-12-18 NOTE — Telephone Encounter (Addendum)
Spoke to his wife - states her husband is going to [facility] clinic from Mon - Fri (8:15am to 4:00pm).  Says he is active while at his program.  He tends to lay around and watch TV when he gets home.  He goes to bed around 8:30pm and sleeps through the night.  It sounds like he is doing pretty well.  I advise her to make sure he is eating a well balanced diet, staying well hydrated and encouraged exercise, as tolerated (suggested taking short walks together).  He is no longer taking his memantine or donepezil.  She is aware that the purpose of these medications are to slow his memory loss.  She is going to try to talk him into restarting them.

## 2017-12-18 NOTE — Telephone Encounter (Signed)
Pts wife [name] called stating that the pt is no longer taking his medication(currently driving unsure of name). [name] stating the pt only has the energy to do a few activities daily and will go to bed early in the night. Requesting a call back to discuss if the pt goes back on medication would this help with his low energy? Or is this just a part of Alzheimer's?

## 2017-12-18 NOTE — Telephone Encounter (Signed)
[name] returning RNs call

## 2018-07-01 ENCOUNTER — Telehealth: Payer: Self-pay | Admitting: *Deleted

## 2018-07-01 NOTE — Telephone Encounter (Signed)
Due to current COVID 19 pandemic, our office is severely reducing in office visits until further notice, in order to minimize the risk to our patients and healthcare providers.  Pt understands that although there may be some limitations with this type of visit, we will take all precautions to reduce any security or privacy concerns.  Pt understands that this will be treated like an in office visit and we will file with pt's insurance, and there may be a patient responsible charge related to this service.  Consented to [email or web address] visit email [email or web address] ([email or web address]).  Email sent.   Will mail [facility] form for [place] to fill out, and fax back to [facility] for continued program.  He is refusing to take medications per wife.

## 2018-07-02 ENCOUNTER — Telehealth: Payer: Self-pay | Admitting: Adult Health

## 2018-07-02 NOTE — Telephone Encounter (Signed)
Please resend link wife deleted but mistake apt. With [facility] Monday.

## 2018-07-02 NOTE — Telephone Encounter (Signed)
Link has been resent.

## 2018-07-06 ENCOUNTER — Ambulatory Visit (INDEPENDENT_AMBULATORY_CARE_PROVIDER_SITE_OTHER): Payer: Medicare Other | Admitting: Adult Health

## 2018-07-06 ENCOUNTER — Other Ambulatory Visit: Payer: Self-pay

## 2018-07-06 ENCOUNTER — Encounter: Payer: Self-pay | Admitting: Adult Health

## 2018-07-06 DIAGNOSIS — F0281 Dementia in other diseases classified elsewhere with behavioral disturbance: Secondary | ICD-10-CM | POA: Diagnosis not present

## 2018-07-06 DIAGNOSIS — G309 Alzheimer's disease, unspecified: Secondary | ICD-10-CM

## 2018-07-06 NOTE — Progress Notes (Signed)
PATIENT: Jeffrey Huerta DOB: 02/02/1946  REASON FOR VISIT: follow up HISTORY FROM: patient  Virtual Visit via Video Note  I connected with [name] on 07/06/18 at  9:00 AM EDT by a video enabled telemedicine application located remotely at [facility] and verified that I am speaking with the correct person using two identifiers who was located at their own home.   I discussed the limitations of evaluation and management by telemedicine and the availability of in person appointments. The patient expressed understanding and agreed to proceed.   PATIENT: Jeffrey Huerta DOB: Mar 27, 1945  REASON FOR VISIT: follow up HISTORY FROM: patient  HISTORY OF PRESENT ILLNESS: Today 07/06/18:  [name] is a 73 year old male with a history of memory disturbance.  He returns today for virtual visit.  I spoke to his wife.  She reports that [facility] been closed due to COVID-19.  She states that they are planning on reopening June 1st she states that since he has been home with her he has been staying in bed more throughout the day.  Which in turn he stays up at night.  He requires prompting and supervision with ADLs.  He does not take his medication.  He reports that he will not take any of his medications despite what she tries.  He no longer operates a Teacher, music.  She reports good appetite.  She states that in regards to his mood and behavior he gets agitated easily and will curse but he does not get physical.  I spoke to the patient he was very limited in his answers.  Reports that he "feels good."  HISTORY ( Copied from Dr. Rhea Belton note) Jul 01 2017: He drove off and got lost in Dec 2018, family eventually found him at [facility], he has stopped driving since then, he is now attending [facility] on a daily basis, doing very well, has good appetite, sleeps well, but he refused to take his medications,  REVIEW OF SYSTEMS: Out of a complete 14  system review of symptoms, the patient complains only of the following symptoms, and all other reviewed systems are negative.  See HPI  ALLERGIES: No Known Allergies  HOME MEDICATIONS: Outpatient Medications Prior to Visit  Medication Sig Dispense Refill  . donepezil (ARICEPT) 10 MG tablet Take 1 tablet (10 mg total) by mouth at bedtime. 90 tablet 4  . losartan (COZAAR) 50 MG tablet Take 50 mg by mouth.    . memantine (NAMENDA) 10 MG tablet Take 1 tablet (10 mg total) by mouth 2 (two) times daily. 180 tablet 4   No facility-administered medications prior to visit.     PAST MEDICAL HISTORY: Past Medical History:  Diagnosis Date  . Aneurysm ([place])   . AVM (arteriovenous malformation)   . Dementia   . Hydrocephalus    VP shunt in place  . Hypertension     PAST SURGICAL HISTORY: Past Surgical History:  Procedure Laterality Date  . VENTRICULOPERITONEAL SHUNT     prior to 2008    FAMILY HISTORY: Family History  Problem Relation Age of Onset  . Stroke Mother   . Hypertension Mother     SOCIAL HISTORY: Social History   Socioeconomic History  . Marital status: Married    Spouse name: Not on file  . Number of children: 3  . Years of education: 10th  . Highest education level: Not on file  Occupational History  . Occupation: Retired  Scientific laboratory technician  .  Financial resource strain: Not on file  . Food insecurity:    Worry: Not on file    Inability: Not on file  . Transportation needs:    Medical: Not on file    Non-medical: Not on file  Tobacco Use  . Smoking status: Never Smoker  . Smokeless tobacco: Never Used  Substance and Sexual Activity  . Alcohol use: No    Alcohol/week: 0.0 standard drinks  . Drug use: No  . Sexual activity: Not on file  Lifestyle  . Physical activity:    Days per week: Not on file    Minutes per session: Not on file  . Stress: Not on file  Relationships  . Social connections:    Talks on phone: Not on file    Gets together: Not on  file    Attends religious service: Not on file    Active member of club or organization: Not on file    Attends meetings of clubs or organizations: Not on file    Relationship status: Not on file  . Intimate partner violence:    Fear of current or ex partner: Not on file    Emotionally abused: Not on file    Physically abused: Not on file    Forced sexual activity: Not on file  Other Topics Concern  . Not on file  Social History Narrative   Lives at home with wife.   Right-handed.   No caffeine use.      PHYSICAL EXAM  Generalized: Well developed, in no acute distress   Neurological examination  Mentation: Alert oriented to person and place.. Follows all commands intermittently.   Cranial nerve II-XII:  Extraocular movements were full. Facial symmetry noted.  Uvula tongue midline. Head turning and shoulder shrug  were normal and symmetric. Motor: Good strength throughout subjectively per patient Sensory: Sensory testing is intact to soft touch on all 4 extremities subjectively per patient Gait and station: Patient laying in bed and did not want to get up. Reflexes: UTA   DIAGNOSTIC DATA (LABS, IMAGING, TESTING) - I reviewed patient records, labs, notes, testing and imaging myself where available.  Lab Results  Component Value Date   WBC 5.7 08/16/2017   HGB 13.3 08/16/2017   HCT 44.2 08/16/2017   MCV 90.2 08/16/2017   PLT 158 08/16/2017      Component Value Date/Time   NA 141 08/16/2017 1230   K 4.6 08/16/2017 1230   CL 105 08/16/2017 1230   CO2 28 08/16/2017 1230   GLUCOSE 118 (H) 08/16/2017 1230   BUN 17 08/16/2017 1230   CREATININE 1.21 08/16/2017 1230   CALCIUM 9.2 08/16/2017 1230   PROT 7.8 02/09/2017 1252   ALBUMIN 4.1 02/09/2017 1252   AST 71 (H) 02/09/2017 1252   ALT 25 02/09/2017 1252   ALKPHOS 51 02/09/2017 1252   BILITOT 1.5 (H) 02/09/2017 1252   GFRNONAA 58 (L) 08/16/2017 1230   GFRAA >60 08/16/2017 1230   Lab Results  Component Value Date    CHOL (H) 02/22/2010    248        ATP III CLASSIFICATION:  <200     mg/dL   Desirable  200-239  mg/dL   Borderline High  >=240    mg/dL   High          HDL 61 02/22/2010   LDLCALC (H) 02/22/2010    173        Total Cholesterol/HDL:CHD Risk Coronary Heart Disease Risk Table  Men   Women  1/2 Average Risk   3.4   3.3  Average Risk       5.0   4.4  2 X Average Risk   9.6   7.1  3 X Average Risk  23.4   11.0        Use the calculated Patient Ratio above and the CHD Risk Table to determine the patient's CHD Risk.        ATP III CLASSIFICATION (LDL):  <100     mg/dL   Optimal  100-129  mg/dL   Near or Above                    Optimal  130-159  mg/dL   Borderline  160-189  mg/dL   High  >190     mg/dL   Very High   TRIG 70 02/22/2010   CHOLHDL 4.1 02/22/2010     ASSESSMENT AND PLAN 73 y.o. year old male  has a past medical history of Aneurysm ([place]), AVM (arteriovenous malformation), Dementia, Hydrocephalus, and Hypertension. here with :  1.  Alzheimer's disease  We were unable to complete a MMSE today.  The patient is no longer taking Namenda and Aricept.  He is refusing all medications.  These will be taken off of his med list.  I encouraged the patient's wife to continue to monitor his symptoms.  Hopefully his day program reopens soon which will benefit the patient.  I have advised the patient's wife that if his symptoms worsen or he develops new symptoms he should let us know.  He will follow-up in 1 year or sooner if needed.   I spent 15 minutes with the patient and his wife.  This time was spent reviewing the chart prior to the visit, discussing signs and symptoms with his wife and plan of care.   Ward Givens, MSN, NP-C 07/06/2018, 9:03 AM [facility] [address] [place], [place] [address] [phone]

## 2018-07-08 NOTE — Progress Notes (Signed)
I have reviewed and agreed above plan. 

## 2018-07-16 ENCOUNTER — Telehealth: Payer: Self-pay | Admitting: *Deleted

## 2018-07-16 NOTE — Telephone Encounter (Signed)
Received [facility] ADC report.  Completed, sent to MM/NP for signature.

## 2018-07-20 NOTE — Telephone Encounter (Signed)
To MR. 

## 2018-07-20 NOTE — Telephone Encounter (Signed)
Signed and given to [facility]

## 2018-11-07 ENCOUNTER — Other Ambulatory Visit: Payer: Self-pay

## 2018-11-07 ENCOUNTER — Encounter (HOSPITAL_COMMUNITY): Payer: Self-pay | Admitting: Emergency Medicine

## 2018-11-07 ENCOUNTER — Emergency Department (HOSPITAL_COMMUNITY)
Admission: EM | Admit: 2018-11-07 | Discharge: 2018-11-07 | Disposition: A | Discharge status: Home / Self Care | Payer: Medicare Other | Attending: Emergency Medicine | Admitting: Emergency Medicine

## 2018-11-07 ENCOUNTER — Emergency Department (HOSPITAL_COMMUNITY): Payer: Medicare Other

## 2018-11-07 DIAGNOSIS — F0390 Unspecified dementia without behavioral disturbance: Secondary | ICD-10-CM | POA: Diagnosis not present

## 2018-11-07 DIAGNOSIS — I10 Essential (primary) hypertension: Secondary | ICD-10-CM | POA: Diagnosis not present

## 2018-11-07 DIAGNOSIS — Y9389 Activity, other specified: Secondary | ICD-10-CM | POA: Insufficient documentation

## 2018-11-07 DIAGNOSIS — R55 Syncope and collapse: Secondary | ICD-10-CM | POA: Diagnosis not present

## 2018-11-07 DIAGNOSIS — Y998 Other external cause status: Secondary | ICD-10-CM | POA: Insufficient documentation

## 2018-11-07 DIAGNOSIS — S0083XA Contusion of other part of head, initial encounter: Secondary | ICD-10-CM | POA: Diagnosis not present

## 2018-11-07 DIAGNOSIS — W01198A Fall on same level from slipping, tripping and stumbling with subsequent striking against other object, initial encounter: Secondary | ICD-10-CM | POA: Insufficient documentation

## 2018-11-07 DIAGNOSIS — Y92012 Bathroom of single-family (private) house as the place of occurrence of the external cause: Secondary | ICD-10-CM | POA: Diagnosis not present

## 2018-11-07 DIAGNOSIS — S0990XA Unspecified injury of head, initial encounter: Secondary | ICD-10-CM | POA: Diagnosis present

## 2018-11-07 LAB — CBC
HCT: 42.4 % (ref 39.0–52.0)
Hemoglobin: 13.2 g/dL (ref 13.0–17.0)
MCH: 28.4 pg (ref 26.0–34.0)
MCHC: 31.1 g/dL (ref 30.0–36.0)
MCV: 91.2 fL (ref 80.0–100.0)
Platelets: 152 10*3/uL (ref 150–400)
RBC: 4.65 MIL/uL (ref 4.22–5.81)
RDW: 13.1 % (ref 11.5–15.5)
WBC: 6.9 10*3/uL (ref 4.0–10.5)
nRBC: 0 % (ref 0.0–0.2)

## 2018-11-07 LAB — URINALYSIS, ROUTINE W REFLEX MICROSCOPIC
Bacteria, UA: NONE SEEN
Bilirubin Urine: NEGATIVE
Glucose, UA: NEGATIVE mg/dL
Hgb urine dipstick: NEGATIVE
Ketones, ur: NEGATIVE mg/dL
Nitrite: NEGATIVE
Protein, ur: NEGATIVE mg/dL
Specific Gravity, Urine: 1.021 (ref 1.005–1.030)
pH: 5 (ref 5.0–8.0)

## 2018-11-07 LAB — BASIC METABOLIC PANEL
Anion gap: 9 (ref 5–15)
BUN: 17 mg/dL (ref 8–23)
CO2: 25 mmol/L (ref 22–32)
Calcium: 9 mg/dL (ref 8.9–10.3)
Chloride: 102 mmol/L (ref 98–111)
Creatinine, Ser: 1.1 mg/dL (ref 0.61–1.24)
GFR calc Af Amer: >60 mL/min (ref >60)
GFR calc non Af Amer: >60 mL/min (ref >60)
Glucose, Bld: 122 mg/dL — ABNORMAL HIGH (ref 70–99)
Potassium: 4.4 mmol/L (ref 3.5–5.1)
Sodium: 136 mmol/L (ref 135–145)

## 2018-11-07 NOTE — ED Notes (Signed)
Family member has arrived to sit with pt.

## 2018-11-07 NOTE — ED Notes (Signed)
Fluids given, vitals done, iv removed, ambulation done

## 2018-11-07 NOTE — ED Provider Notes (Signed)
[facility] EMERGENCY DEPARTMENT Provider Note   CSN: [id number] Arrival date & time: 11/07/18  0401     History   Chief Complaint Chief Complaint  Patient presents with   Near Syncope    HPI Jeffrey Huerta is a 73 y.o. male.     Patient with hx dementia, presents via EMS s/p fall in bathroom this AM. Patient unable to provide history of events at that time due to dementia - level 5 caveat. Family member indicates pt was in bathroom, on knees with small contusion to right side of face - was awake, alert, with mental status at baseline then. Patient indicates he feels fine. At baseline, dementia/confusion, walks on own. Patient denies headache. No chest pain. No sob. No abd pain or nvd. No dysuria or gu c/o. Normal appetite. No fever or chills.   The history is provided by the patient, a relative and the EMS personnel. The history is limited by the condition of the patient.  Near Syncope Pertinent negatives include no chest pain, no abdominal pain, no headaches and no shortness of breath.    Past Medical History:  Diagnosis Date   Aneurysm ([place])    AVM (arteriovenous malformation)    Dementia ([place])    Hydrocephalus ([place])    VP shunt in place   Hypertension     Patient Active Problem List   Diagnosis Date Noted   Dehydration 02/09/2017   Dementia ([place]) 02/09/2017   S/P VP shunt 02/09/2017   Rhabdomyolysis 02/09/2017   Memory loss 06/21/2015   AVM (arteriovenous malformation) 06/21/2015    Past Surgical History:  Procedure Laterality Date   VENTRICULOPERITONEAL SHUNT     prior to 2008        Home Medications    Prior to Admission medications   Medication Sig Start Date End Date Taking? Authorizing Provider  losartan (COZAAR) 50 MG tablet Take 50 mg by mouth.    [provider]    Family History Family History  Problem Relation Age of Onset   Stroke Mother    Hypertension Mother     Social History Social History     Tobacco Use   Smoking status: Never Smoker   Smokeless tobacco: Never Used  Substance Use Topics   Alcohol use: No    Alcohol/week: 0.0 standard drinks   Drug use: No     Allergies   Patient has no known allergies.   Review of Systems Review of Systems  Constitutional: Negative for fever.  HENT: Negative for sore throat.   Eyes: Negative for pain and visual disturbance.  Respiratory: Negative for cough and shortness of breath.   Cardiovascular: Positive for near-syncope. Negative for chest pain.  Gastrointestinal: Negative for abdominal pain, blood in stool, diarrhea and vomiting.  Genitourinary: Negative for dysuria and flank pain.  Musculoskeletal: Negative for back pain and neck pain.  Skin: Negative for wound.  Neurological: Negative for speech difficulty, weakness, numbness and headaches.  Hematological: Does not bruise/bleed easily.  Psychiatric/Behavioral: Negative for agitation.     Physical Exam Updated Vital Signs BP 124/81 (BP Location: Right Arm)    Pulse 63    Temp 98.3 F (36.8 C) (Oral)    Resp 16    SpO2 100%   Physical Exam Vitals signs and nursing note reviewed.  Constitutional:      Appearance: Normal appearance. He is well-developed.  HENT:     Head:     Comments: Mild contusion to right side of face.  Facial bones/orbits grossly intact. Skin intact.     Nose: Nose normal.     Mouth/Throat:     Mouth: Mucous membranes are moist.     Pharynx: Oropharynx is clear.  Eyes:     General: No scleral icterus.    Extraocular Movements: Extraocular movements intact.     Conjunctiva/sclera: Conjunctivae normal.     Pupils: Pupils are equal, round, and reactive to light.  Neck:     Musculoskeletal: Normal range of motion and neck supple. No neck rigidity or muscular tenderness.     Vascular: No carotid bruit.     Trachea: No tracheal deviation.  Cardiovascular:     Rate and Rhythm: Normal rate and regular rhythm.     Pulses: Normal pulses.      Heart sounds: Normal heart sounds. No murmur. No friction rub. No gallop.   Pulmonary:     Effort: Pulmonary effort is normal. No accessory muscle usage or respiratory distress.     Breath sounds: Normal breath sounds.  Chest:     Chest wall: No tenderness.  Abdominal:     General: Bowel sounds are normal. There is no distension.     Palpations: Abdomen is soft. There is no mass.     Tenderness: There is no abdominal tenderness. There is no guarding or rebound.     Hernia: No hernia is present.  Genitourinary:    Comments: No cva tenderness. Musculoskeletal:        General: No swelling or tenderness.     Right lower leg: No edema.     Left lower leg: No edema.     Comments: CTLS spine, non tender, aligned, no step off. Good rom bil ext without pain or focal bony tenderness.   Skin:    General: Skin is warm and dry.     Findings: No rash.  Neurological:     Mental Status: He is alert.     Comments: Alert, speech clear/fluent. Oriented to person/place. Motor intact bil, str 5/5. sens grossly intact bil. Steady gait.   Psychiatric:        Mood and Affect: Mood normal.      ED Treatments / Results  Labs (all labs ordered are listed, but only abnormal results are displayed) Results for orders placed or performed during the hospital encounter of [id number]  Basic metabolic panel  Result Value Ref Range   Sodium 136 135 - 145 mmol/L   Potassium 4.4 3.5 - 5.1 mmol/L   Chloride 102 98 - 111 mmol/L   CO2 25 22 - 32 mmol/L   Glucose, Bld 122 (H) 70 - 99 mg/dL   BUN 17 8 - 23 mg/dL   Creatinine, Ser 1.10 0.61 - 1.24 mg/dL   Calcium 9.0 8.9 - 10.3 mg/dL   GFR calc non Af Amer >60 >60 mL/min   GFR calc Af Amer >60 >60 mL/min   Anion gap 9 5 - 15  CBC  Result Value Ref Range   WBC 6.9 4.0 - 10.5 K/uL   RBC 4.65 4.22 - 5.81 MIL/uL   Hemoglobin 13.2 13.0 - 17.0 g/dL   HCT 42.4 39.0 - 52.0 %   MCV 91.2 80.0 - 100.0 fL   MCH 28.4 26.0 - 34.0 pg   MCHC 31.1 30.0 - 36.0 g/dL   RDW  13.1 11.5 - 15.5 %   Platelets 152 150 - 400 K/uL   nRBC 0.0 0.0 - 0.2 %  Urinalysis, Routine w reflex microscopic  Result  Value Ref Range   Color, Urine YELLOW YELLOW   APPearance CLEAR CLEAR   Specific Gravity, Urine 1.021 1.005 - 1.030   pH 5.0 5.0 - 8.0   Glucose, UA NEGATIVE NEGATIVE mg/dL   Hgb urine dipstick NEGATIVE NEGATIVE   Bilirubin Urine NEGATIVE NEGATIVE   Ketones, ur NEGATIVE NEGATIVE mg/dL   Protein, ur NEGATIVE NEGATIVE mg/dL   Nitrite NEGATIVE NEGATIVE   Leukocytes,Ua TRACE (A) NEGATIVE   RBC / HPF 0-5 0 - 5 RBC/hpf   WBC, UA 0-5 0 - 5 WBC/hpf   Bacteria, UA NONE SEEN NONE SEEN   Mucus PRESENT     EKG EKG Interpretation  Date/Time:  Saturday November 07 2018 04:12:40 EDT Ventricular Rate:  65 PR Interval:  164 QRS Duration: 86 QT Interval:  438 QTC Calculation: 455 R Axis:   24 Text Interpretation:  Normal sinus rhythm Nonspecific T wave abnormality Confirmed by Lajean Saver [phone]) on 11/07/2018 11:08:19 AM   Radiology Ct Head Wo Contrast  Result Date: 11/07/2018 CLINICAL DATA:  Head trauma, multiple syncopal episodes today. EXAM: CT HEAD WITHOUT CONTRAST TECHNIQUE: Contiguous axial images were obtained from the base of the skull through the vertex without intravenous contrast. COMPARISON:  Head CT dated 02/09/2017. FINDINGS: Brain: Ventricles are stable in size and configuration. RIGHT frontal approach ventriculostomy shunt appears appropriately positioned at the level of the frontal horn of the RIGHT lateral ventricle. Chronic encephalomalacia within the LEFT cerebellum. Chronic small vessel ischemic changes again noted within the bilateral periventricular and subcortical white matter regions. There is no mass, hemorrhage, edema or other evidence of acute parenchymal abnormality. No extra-axial hemorrhage. Vascular: Chronic calcified atherosclerotic changes of the large vessels at the skull base. No unexpected hyperdense vessel. Skull: No acute findings.  Surgical changes of RIGHT frontal w ventriculostomy. Surgical changes of skull base craniotomy. Sinuses/Orbits: No acute finding. Other: None. IMPRESSION: 1. No acute findings. No intracranial mass, hemorrhage or edema. No skull fracture. 2. RIGHT frontal approach ventriculostomy shunt appears appropriately positioned. Ventricles are stable in size and configuration. 3. Chronic ischemic changes, as detailed above. Electronically Signed   By: Franki Cabot M.D.   On: 11/07/2018 12:25    Procedures Procedures (including critical care time)  Medications Ordered in ED Medications - No data to display   Initial Impression / Assessment and Plan / ED Course  I have reviewed the triage vital signs and the nursing notes.  Pertinent labs & imaging results that were available during my care of the patient were reviewed by me and considered in my medical decision making (see chart for details).  Labs sent. Imaging ordered.  Reviewed nursing notes and prior charts for additional history.   Labs reviewed by me - chem normal. hgb normal.   Family member continues to indicate patients mental status and functional ability appears c/w his baseline.   CT reviewed by me - no acute hem.   Patient/family requests  D/c to home.  Ambulates w steady gait. Tolerating po.  Continues to request d/c.   Pt currently appears stable for d/c.   Return precautions provided, and pcp f/u recommended.     Final Clinical Impressions(s) / ED Diagnoses   Final diagnoses:  None    ED Discharge Orders    None       Lajean Saver, MD 11/07/18 1418

## 2018-11-07 NOTE — Discharge Instructions (Addendum)
It was our pleasure to provide your ER care today - we hope that you feel better.  Your lab tests and CT scan look good.   Follow up with primary care doctor in the next 1-2 weeks.  Return to ER if worse, new symptoms, fevers, weak/fainting, new or severe pain, trouble breathing, or other concern.

## 2018-11-07 NOTE — ED Triage Notes (Signed)
Pt to triage via [facility] for multiple syncopal episodes today.  Pt denies any complaints.  States he feels fine.  History of dementia.  Alert and oriented x 3.

## 2018-11-07 NOTE — ED Notes (Signed)
Patient transported to ct

## 2018-11-07 NOTE — ED Notes (Signed)
Pt ambulated well, has a habit of dragging his feet and hit his sandals a couple of times, but can functionally ambulate.

## 2019-03-05 ENCOUNTER — Emergency Department (HOSPITAL_COMMUNITY): Payer: Medicare Other

## 2019-03-05 ENCOUNTER — Other Ambulatory Visit: Payer: Self-pay

## 2019-03-05 ENCOUNTER — Inpatient Hospital Stay (HOSPITAL_COMMUNITY)
Admission: EM | Admit: 2019-03-05 | Discharge: 2019-03-09 | DRG: 177 | Disposition: A | Discharge status: Home Health | Payer: Medicare Other | Attending: Internal Medicine | Admitting: Internal Medicine

## 2019-03-05 DIAGNOSIS — J1282 Pneumonia due to coronavirus disease 2019: Secondary | ICD-10-CM | POA: Diagnosis present

## 2019-03-05 DIAGNOSIS — F0390 Unspecified dementia without behavioral disturbance: Secondary | ICD-10-CM | POA: Diagnosis present

## 2019-03-05 DIAGNOSIS — E162 Hypoglycemia, unspecified: Secondary | ICD-10-CM

## 2019-03-05 DIAGNOSIS — R001 Bradycardia, unspecified: Secondary | ICD-10-CM | POA: Diagnosis present

## 2019-03-05 DIAGNOSIS — Z982 Presence of cerebrospinal fluid drainage device: Secondary | ICD-10-CM | POA: Diagnosis not present

## 2019-03-05 DIAGNOSIS — J9601 Acute respiratory failure with hypoxia: Secondary | ICD-10-CM | POA: Diagnosis present

## 2019-03-05 DIAGNOSIS — J069 Acute upper respiratory infection, unspecified: Secondary | ICD-10-CM | POA: Diagnosis not present

## 2019-03-05 DIAGNOSIS — G309 Alzheimer's disease, unspecified: Secondary | ICD-10-CM | POA: Diagnosis not present

## 2019-03-05 DIAGNOSIS — F0280 Dementia in other diseases classified elsewhere without behavioral disturbance: Secondary | ICD-10-CM | POA: Diagnosis not present

## 2019-03-05 DIAGNOSIS — D649 Anemia, unspecified: Secondary | ICD-10-CM | POA: Diagnosis present

## 2019-03-05 DIAGNOSIS — U071 COVID-19: Principal | ICD-10-CM | POA: Diagnosis present

## 2019-03-05 DIAGNOSIS — I959 Hypotension, unspecified: Secondary | ICD-10-CM | POA: Diagnosis present

## 2019-03-05 DIAGNOSIS — R55 Syncope and collapse: Secondary | ICD-10-CM

## 2019-03-05 DIAGNOSIS — I10 Essential (primary) hypertension: Secondary | ICD-10-CM | POA: Diagnosis present

## 2019-03-05 LAB — CBC WITH DIFFERENTIAL/PLATELET
Abs Immature Granulocytes: 0.06 10*3/uL (ref 0.00–0.07)
Basophils Absolute: 0 10*3/uL (ref 0.0–0.1)
Basophils Relative: 0 %
Eosinophils Absolute: 0 10*3/uL (ref 0.0–0.5)
Eosinophils Relative: 0 %
HCT: 36.8 % — ABNORMAL LOW (ref 39.0–52.0)
Hemoglobin: 11.1 g/dL — ABNORMAL LOW (ref 13.0–17.0)
Immature Granulocytes: 1 %
Lymphocytes Relative: 38 %
Lymphs Abs: 2.7 10*3/uL (ref 0.7–4.0)
MCH: 28 pg (ref 26.0–34.0)
MCHC: 30.2 g/dL (ref 30.0–36.0)
MCV: 92.7 fL (ref 80.0–100.0)
Monocytes Absolute: 0.2 10*3/uL (ref 0.1–1.0)
Monocytes Relative: 3 %
Neutro Abs: 4.1 10*3/uL (ref 1.7–7.7)
Neutrophils Relative %: 58 %
Platelets: 199 10*3/uL (ref 150–400)
RBC: 3.97 MIL/uL — ABNORMAL LOW (ref 4.22–5.81)
RDW: 13.7 % (ref 11.5–15.5)
WBC: 7.1 10*3/uL (ref 4.0–10.5)
nRBC: 0 % (ref 0.0–0.2)

## 2019-03-05 LAB — COMPREHENSIVE METABOLIC PANEL
ALT: 13 U/L (ref 0–44)
AST: 25 U/L (ref 15–41)
Albumin: 2.3 g/dL — ABNORMAL LOW (ref 3.5–5.0)
Alkaline Phosphatase: 38 U/L (ref 38–126)
Anion gap: 12 (ref 5–15)
BUN: 14 mg/dL (ref 8–23)
CO2: 22 mmol/L (ref 22–32)
Calcium: 7.8 mg/dL — ABNORMAL LOW (ref 8.9–10.3)
Chloride: 104 mmol/L (ref 98–111)
Creatinine, Ser: 1.2 mg/dL (ref 0.61–1.24)
GFR calc Af Amer: >60 mL/min (ref >60)
GFR calc non Af Amer: 59 mL/min — ABNORMAL LOW (ref >60)
Glucose, Bld: 89 mg/dL (ref 70–99)
Potassium: 4.3 mmol/L (ref 3.5–5.1)
Sodium: 138 mmol/L (ref 135–145)
Total Bilirubin: 0.9 mg/dL (ref 0.3–1.2)
Total Protein: 6.8 g/dL (ref 6.5–8.1)

## 2019-03-05 LAB — CBG MONITORING, ED
Glucose-Capillary: 55 mg/dL — ABNORMAL LOW (ref 70–99)
Glucose-Capillary: 80 mg/dL (ref 70–99)
Glucose-Capillary: 138 mg/dL — ABNORMAL HIGH (ref 70–99)

## 2019-03-05 LAB — LACTIC ACID, PLASMA: Lactic Acid, Venous: 1.5 mmol/L (ref 0.5–1.9)

## 2019-03-05 LAB — LACTATE DEHYDROGENASE: LDH: 329 U/L — ABNORMAL HIGH (ref 98–192)

## 2019-03-05 LAB — FIBRINOGEN: Fibrinogen: 728 mg/dL — ABNORMAL HIGH (ref 210–475)

## 2019-03-05 LAB — TROPONIN I (HIGH SENSITIVITY)
Troponin I (High Sensitivity): 4 ng/L (ref <18)
Troponin I (High Sensitivity): 4 ng/L (ref <18)

## 2019-03-05 LAB — FERRITIN: Ferritin: 400 ng/mL — ABNORMAL HIGH (ref 24–336)

## 2019-03-05 LAB — POC SARS CORONAVIRUS 2 AG -  ED: SARS Coronavirus 2 Ag: POSITIVE — AB

## 2019-03-05 LAB — C-REACTIVE PROTEIN: CRP: 11.4 mg/dL — ABNORMAL HIGH (ref <1.0)

## 2019-03-05 LAB — D-DIMER, QUANTITATIVE: D-Dimer, Quant: 1.62 ug/mL-FEU — ABNORMAL HIGH (ref 0.00–0.50)

## 2019-03-05 LAB — PROCALCITONIN: Procalcitonin: <0.1 ng/mL

## 2019-03-05 LAB — TRIGLYCERIDES: Triglycerides: 95 mg/dL (ref <150)

## 2019-03-05 MED ORDER — ACETAMINOPHEN 325 MG PO TABS: 650.0000 mg | ORAL_TABLET | Freq: Four times a day (QID) | ORAL | Status: DC | PRN

## 2019-03-05 MED ORDER — GUAIFENESIN-DM 100-10 MG/5ML PO SYRP: 10.0000 mL | ORAL_SOLUTION | ORAL | Status: DC | PRN

## 2019-03-05 MED ORDER — HYDROCOD POLST-CPM POLST ER 10-8 MG/5ML PO SUER: 5.0000 mL | Freq: Two times a day (BID) | ORAL | Status: DC | PRN

## 2019-03-05 MED ORDER — OXYCODONE HCL 5 MG PO TABS: 5.0000 mg | ORAL_TABLET | ORAL | Status: DC | PRN

## 2019-03-05 MED ORDER — SODIUM CHLORIDE 0.9 % IV SOLN
100.0000 mg | INTRAVENOUS | Status: AC
  Administered 2019-03-06 – 2019-03-09 (×4): 100 mg via INTRAVENOUS
  Filled 2019-03-05 (×3): qty 100
  Filled 2019-03-05: qty 20

## 2019-03-05 MED ORDER — SODIUM CHLORIDE 0.9 % IV SOLN: 250.0000 mL | INTRAVENOUS | Status: DC | PRN

## 2019-03-05 MED ORDER — BISACODYL 5 MG PO TBEC: 5.0000 mg | DELAYED_RELEASE_TABLET | Freq: Every day | ORAL | Status: DC | PRN

## 2019-03-05 MED ORDER — DEXAMETHASONE SODIUM PHOSPHATE 10 MG/ML IJ SOLN
6.0000 mg | INTRAMUSCULAR | Status: DC
  Administered 2019-03-06 – 2019-03-09 (×4): 6 mg via INTRAVENOUS
  Filled 2019-03-05 (×4): qty 1

## 2019-03-05 MED ORDER — SODIUM CHLORIDE 0.9% FLUSH
3.0000 mL | Freq: Two times a day (BID) | INTRAVENOUS | Status: DC
  Administered 2019-03-05 – 2019-03-08 (×6): 3 mL via INTRAVENOUS

## 2019-03-05 MED ORDER — SODIUM CHLORIDE 0.9% FLUSH: 3.0000 mL | INTRAVENOUS | Status: DC | PRN

## 2019-03-05 MED ORDER — POLYETHYLENE GLYCOL 3350 17 G PO PACK
17.0000 g | PACK | Freq: Every day | ORAL | Status: DC | PRN
  Administered 2019-03-07: 17 g via ORAL
  Filled 2019-03-05: qty 1

## 2019-03-05 MED ORDER — ONDANSETRON HCL 4 MG/2ML IJ SOLN: 4.0000 mg | Freq: Four times a day (QID) | INTRAMUSCULAR | Status: DC | PRN

## 2019-03-05 MED ORDER — DEXAMETHASONE SODIUM PHOSPHATE 10 MG/ML IJ SOLN
6.0000 mg | Freq: Once | INTRAMUSCULAR | Status: AC
  Administered 2019-03-05: 6 mg via INTRAVENOUS
  Filled 2019-03-05: qty 1

## 2019-03-05 MED ORDER — ENOXAPARIN SODIUM 40 MG/0.4ML ~~LOC~~ SOLN
40.0000 mg | Freq: Every day | SUBCUTANEOUS | Status: DC
  Administered 2019-03-05 – 2019-03-07 (×3): 40 mg via SUBCUTANEOUS
  Filled 2019-03-05 (×3): qty 0.4

## 2019-03-05 MED ORDER — FLEET ENEMA 7-19 GM/118ML RE ENEM: 1.0000 | ENEMA | Freq: Once | RECTAL | Status: DC | PRN

## 2019-03-05 MED ORDER — SODIUM CHLORIDE 0.9 % IV SOLN
200.0000 mg | Freq: Once | INTRAVENOUS | Status: AC
  Administered 2019-03-05: 200 mg via INTRAVENOUS
  Filled 2019-03-05: qty 40

## 2019-03-05 MED ORDER — SODIUM CHLORIDE 0.9 % IV SOLN: 200.0000 mg | Freq: Once | INTRAVENOUS | Status: DC

## 2019-03-05 MED ORDER — ALBUTEROL SULFATE HFA 108 (90 BASE) MCG/ACT IN AERS
2.0000 | INHALATION_SPRAY | RESPIRATORY_TRACT | Status: DC | PRN
  Administered 2019-03-09: 2 via RESPIRATORY_TRACT
  Filled 2019-03-05: qty 6.7

## 2019-03-05 MED ORDER — SODIUM CHLORIDE 0.9 % IV SOLN: 100.0000 mg | Freq: Every day | INTRAVENOUS | Status: DC

## 2019-03-05 MED ORDER — ONDANSETRON HCL 4 MG PO TABS: 4.0000 mg | ORAL_TABLET | Freq: Four times a day (QID) | ORAL | Status: DC | PRN

## 2019-03-05 NOTE — ED Notes (Signed)
Reported covid 19 to nurse ELIZABETH ROB .RN COVID WAS POSTIVE

## 2019-03-05 NOTE — ED Notes (Signed)
On call provider paged for approval to transport pt to floor. Notified of HR of 38-40s.

## 2019-03-05 NOTE — ED Notes (Signed)
Ok to transport pt to the floor. Pt able to void large amount of urine. Pt alert, speaking in full sentences. Breathing easy, non-labored. Equal rise and fall of chest noted. VSS per baseline.

## 2019-03-05 NOTE — ED Notes (Signed)
CBG 138 Pt remains resting on cart in NAD. VSS. Call light within reach. Hourly rounds completed. Will continue to monitor

## 2019-03-05 NOTE — ED Notes (Signed)
Pt resting on cart in NAD with eyes closed. VSS per baseline. Pt breathing easy, non-labored. Equal rise and fall of chest noted. Will continue to monitor. Call light within reach

## 2019-03-05 NOTE — ED Notes (Signed)
Assumed care of pt. Pt resting on cart in NAD. Alert, speaking in full sentences. Breathing easy, non-labored on RA. Pt calm and cooperative. VSS on monitors. Call light within reach, will continue to monitor

## 2019-03-05 NOTE — ED Notes (Signed)
Pt cleaned, linens changed entirely. New gown applied. Patient had soiled himself. Meal tray brought in and this RN assisted with patient to eat. He ate approximately 1/4 of his meal before losing interest.

## 2019-03-05 NOTE — ED Provider Notes (Signed)
[facility] EMERGENCY DEPARTMENT Provider Note  CSN: [id number] Arrival date & time: 03/05/19 [id number]  Chief Complaint(s) Loss of Consciousness and Shortness of Breath  HPI Jeffrey Huerta is a 74 y.o. male with a history of dementia who presents to the emergency department after syncopal episode found to be hypoxic by EMS with sats in the 60s on room air.  Family reports patient has frequent syncopal episodes but typically recovers quickly.  This time patient was slow to respond and took him over 5 minutes to wake up.  He was found in the bathroom which is where he typically will pass out.  Family reports that the patient tested positive for COVID-19 5 days ago.  They report he was exposed to COVID-19 positive patient at his memory rehab facility last week.  He developed fevers 7 days ago and has had some fatigue without other respiratory or GI symptoms.  They deny any vomiting or diarrhea.  At baseline the baseline has a difficult time getting the patient to hydrate.   Remainder of history, ROS, and physical exam limited due to patient's condition (dementia). Additional information was obtained from EMS and family.   Level V Caveat.     HPI  Past Medical History Past Medical History:  Diagnosis Date  . Aneurysm ([place])   . AVM (arteriovenous malformation)   . Dementia ([place])   . 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 Hypertension    Patient Active Problem List   Diagnosis Date Noted  . Dehydration 02/09/2017  . Dementia ([place]) 02/09/2017  . 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 Rhabdomyolysis 02/09/2017  . Memory loss 06/21/2015  . AVM (arteriovenous malformation) 06/21/2015   Home Medication(s) Prior to Admission medications   Medication Sig Start Date End Date Taking? Authorizing Provider  atorvastatin (LIPITOR) 40 MG tablet Take 40 mg by mouth daily.    [provider]  donepezil (ARICEPT) 10 MG tablet Take 10 mg by mouth at bedtime.    [provider]  losartan (COZAAR) 50 MG tablet Take 50 mg by mouth.    [provider]                                                                                                                                    Past Surgical History Past Surgical History:  Procedure Laterality Date  . VENTRICULOPERITONEAL SHUNT     prior to 2008   Family History Family History  Problem Relation Age of Onset  . Stroke Mother   . Hypertension Mother     Social History Social History   Tobacco Use  . Smoking status: Never Smoker  . Smokeless tobacco: Never Used  Substance Use Topics  . Alcohol use: No    Alcohol/week: 0.0 standard drinks  . Drug use: No   Allergies Patient has no known allergies.  Review of Systems Review of Systems  Unable to perform ROS: Dementia    Physical Exam Vital Signs  I have reviewed the triage vital signs BP 125/66   Pulse 62   Temp 98.8 F (37.1 C) (Oral)   Resp (!) 26   SpO2 89% on RA  Physical Exam Vitals reviewed.  Constitutional:      General: He is not in acute distress.    Appearance: He is well-developed. He is not diaphoretic.  HENT:     Head: Normocephalic and atraumatic.     Nose: Nose normal.  Eyes:     General: No scleral icterus.       Right eye: No discharge.        Left eye: No discharge.     Conjunctiva/sclera: Conjunctivae normal.     Pupils: Pupils are equal, round, and reactive to light.  Cardiovascular:     Rate and Rhythm: Normal rate and regular rhythm.     Heart sounds: No murmur. No friction rub. No gallop.   Pulmonary:     Effort: Pulmonary effort is normal. No respiratory distress.     Breath sounds: Normal breath sounds. No stridor. No rales.  Abdominal:     General: There is no distension.     Palpations: Abdomen is soft.     Tenderness: There is no abdominal tenderness.  Musculoskeletal:        General: No tenderness.     Cervical back: Normal range of motion and neck supple.  Skin:    General: Skin is  warm and dry.     Findings: No erythema or rash.  Neurological:     Mental Status: He is alert.     Comments: Oriented to person and place. Not time, current events, or situation (baseline per family) Follows commands 5/5 strength throughout     ED Results and Treatments Labs (all labs ordered are listed, but only abnormal results are displayed) Labs Reviewed  CBC WITH DIFFERENTIAL/PLATELET - Abnormal; Notable for the following components:      Result Value   RBC 3.97 (*)    Hemoglobin 11.1 (*)    HCT 36.8 (*)    All other components within normal limits  COMPREHENSIVE METABOLIC PANEL - Abnormal; Notable for the following components:   Calcium 7.8 (*)    Albumin 2.3 (*)    GFR calc non Af Amer 59 (*)    All other components within normal limits  LACTATE DEHYDROGENASE - Abnormal; Notable for the following components:   LDH 329 (*)    All other components within normal limits  FERRITIN - Abnormal; Notable for the following components:   Ferritin 400 (*)    All other components within normal limits  C-REACTIVE PROTEIN - Abnormal; Notable for the following components:   CRP 11.4 (*)    All other components within normal limits  CBG MONITORING, ED - Abnormal; Notable for the following components:   Glucose-Capillary 55 (*)    All other components within normal limits  POC SARS CORONAVIRUS 2 AG -  ED - Abnormal; Notable for the following components:   SARS Coronavirus 2 Ag POSITIVE (*)    All other components within normal limits  CULTURE, BLOOD (ROUTINE X 2)  CULTURE, BLOOD (ROUTINE X 2)  LACTIC ACID, PLASMA  TRIGLYCERIDES  LACTIC ACID, PLASMA  D-DIMER, QUANTITATIVE (NOT AT [facility])  PROCALCITONIN  FIBRINOGEN  CBG MONITORING, ED  TROPONIN I (HIGH SENSITIVITY)  TROPONIN I (HIGH SENSITIVITY)  EKG  EKG Interpretation  Date/Time:  Friday March 05 2019  05:46:39 EST Ventricular Rate:  57 PR Interval:    QRS Duration: 90 QT Interval:  452 QTC Calculation: 441 R Axis:   35 Text Interpretation: Sinus rhythm Short PR interval No significant change since last tracing Confirmed by Addison Lank [phone]) on 03/05/2019 6:11:17 AM      Radiology DG Chest Port 1 View  Result Date: 03/05/2019 CLINICAL DATA:  COVID positive EXAM: PORTABLE CHEST 1 VIEW COMPARISON:  02/09/2017 FINDINGS: Normal heart size and mild aortic tortuosity. There could be subtle infiltrate in the right mid lung. No edema, effusion, or pneumothorax. VP shunt traversing the right chest. IMPRESSION: Equivocal for early pneumonia on the right. Electronically Signed   By: Monte Fantasia M.D.   On: 03/05/2019 06:44    Pertinent labs & imaging results that were available during my care of the patient were reviewed by me and considered in my medical decision making (see chart for details).  Medications Ordered in ED Medications - No data to display                                                                                                                                  Procedures Procedures  (including critical care time)  Medical Decision Making / ED Course I have reviewed the nursing notes for this encounter and the patient's prior records (if available in EHR or on provided paperwork).   Jeffrey Huerta was evaluated in Emergency Department on 03/05/2019 for the symptoms described in the history of present illness. He was evaluated in the context of the global COVID-19 pandemic, which necessitated consideration that the patient might be at risk for infection with the SARS-CoV-2 virus that causes COVID-19. Institutional protocols and algorithms that pertain to the evaluation of patients at risk for COVID-19 are in a state of rapid change based on information released by regulatory bodies including the CDC and federal and state organizations. These policies and algorithms were  followed during the patient's care in the ED.  Patient presents for syncopal episode, typical for him however this time patient had prolonged downtime and found to be hypoxic on room air.  Known Covid positive.  Currently patient is at his baseline mental status per family.  Following commands and moving all extremities.  CBG notable for hypoglycemia to 55.  Given oral intake. Covid test confirmed. Orthostatics reassuring  Chest x-ray consistent with likely early pneumonia.  Given patient's hypoxia, will require admission for continued management.     Final Clinical Impression(s) / ED Diagnoses Final diagnoses:  Syncope, unspecified syncope type  COVID-19 virus infection  Hypoglycemia      This chart was dictated using voice recognition software.  Despite best efforts to proofread,  errors can occur which can change the documentation meaning.   Fatima Blank, MD 03/05/19 [phone]

## 2019-03-05 NOTE — Progress Notes (Signed)
The patient is admitted to 2 W. 20 with the diagnosis of acute respiratory failure due to covid 19. A & O x 2. The patient is oriented to his room.ascom/call bell and staff. Admission profile not completed d/t patient's disorientation. Will continue to monitor.

## 2019-03-05 NOTE — ED Provider Notes (Signed)
Patient's care signed out to continue monitoring and admit to the hospitalist.  Blood work reviewed inflammatory markers all significantly elevated secondary to Covid.  Covid test positive.  Reassessment patient stable, normal work of breathing on 2 L nasal cannula.  Discussed with hospitalist for admission, IV Decadron ordered.  Previous clinician updated family.  Jeffrey Huerta was evaluated in Emergency Department on 03/05/2019 for the symptoms described in the history of present illness. He was evaluated in the context of the global COVID-19 pandemic, which necessitated consideration that the patient might be at risk for infection with the SARS-CoV-2 virus that causes COVID-19. Institutional protocols and algorithms that pertain to the evaluation of patients at risk for COVID-19 are in a state of rapid change based on information released by regulatory bodies including the CDC and federal and state organizations. These policies and algorithms were followed during the patient's care in the ED.  CRITICAL CARE Performed by: Mariea Clonts  Total critical care time: 35 minutes  Critical care time was exclusive of separately billable procedures and treating other patients.  Critical care was necessary to treat or prevent imminent or life-threatening deterioration.  Critical care was time spent personally by me on the following activities: development of treatment plan with patient and/or surrogate as well as nursing, discussions with consultants, evaluation of patient's response to treatment, examination of patient, obtaining history from patient or surrogate, ordering and performing treatments and interventions, ordering and review of laboratory studies, ordering and review of radiographic studies, pulse oximetry and re-evaluation of patient's condition.  COVID pneumonia   Jeffrey Morrison, MD 03/05/19 [phone]

## 2019-03-05 NOTE — ED Notes (Signed)
Report given to Earnest Rosier, RN. All questions answered.  Pt's HR dipping to 38-39 on monitor. MD Karmen Bongo made aware

## 2019-03-05 NOTE — ED Triage Notes (Signed)
Pt arrived via EMS from home where family called EMS when they found the pt in the restroom "unresponsive". Pt was responsive when EMS arrived. EMS stated the pt O2 was 60% on room air on arrival and the pt was placed on non-rebreather at 15L/min and pt O2 improved to 100%. Pt is known to be COVID +.

## 2019-03-05 NOTE — ED Notes (Signed)
Pt O2 saturation 89% on room air. Pt removed from non-rebreather and placed on 2L [place]. Pt O2 saturation 95%.

## 2019-03-05 NOTE — ED Notes (Signed)
ED TO INPATIENT HANDOFF REPORT  ED Nurse Name and Phone #: [phone]  S Name/Age/Gender Jeffrey Huerta 74 y.o. male Room/Bed: 012C/012C  Code Status   Code Status: Full Code  Home/SNF/Other Skilled nursing facility Patient oriented to: self and place Is this baseline? Yes   Triage Complete: Triage complete  Chief Complaint Acute respiratory disease due to COVID-19 virus [U07.1, J06.9]  Triage Note Pt arrived via EMS from home where family called EMS when they found the pt in the restroom "unresponsive". Pt was responsive when EMS arrived. EMS stated the pt O2 was 60% on room air on arrival and the pt was placed on non-rebreather at 15L/min and pt O2 improved to 100%. Pt is known to be COVID +.    Allergies No Known Allergies  Level of Care/Admitting Diagnosis ED Disposition    ED Disposition Condition Comment   Admit  Hospital Area: [place] [[id number]]  Level of Care: Telemetry Medical [104]  Covid Evaluation: Confirmed COVID Positive  Diagnosis: Acute respiratory disease due to COVID-19 virus [[id number]]  Admitting Physician: Karmen Bongo [[id number]]  Attending Physician: Karmen Bongo [[id number]]  Estimated length of stay: 5 - 7 days  Certification:: I certify this patient will need inpatient services for at least 2 midnights       B Medical/Surgery History Past Medical History:  Diagnosis Date  . Aneurysm ([place])   . AVM (arteriovenous malformation)   . Dementia ([place])   . Hydrocephalus ([place])    VP shunt in place  . Hypertension    Past Surgical History:  Procedure Laterality Date  . VENTRICULOPERITONEAL SHUNT     prior to 2008     A IV Location/Drains/Wounds Patient Lines/Drains/Airways Status   Active Line/Drains/Airways    Name:   Placement date:   Placement time:   Site:   Days:   Peripheral IV 03/05/19 Right Hand   03/05/19    0532    Hand   less than 1   Peripheral IV 03/05/19 Right Antecubital   03/05/19    0625    Antecubital    less than 1          Intake/Output Last 24 hours  Intake/Output Summary (Last 24 hours) at 03/05/2019 2127 Last data filed at 03/05/2019 1040 Gross per 24 hour  Intake 250 ml  Output --  Net 250 ml    Labs/Imaging Results for orders placed or performed during the hospital encounter of 03/05/19 (from the past 48 hour(s))  CBC WITH DIFFERENTIAL     Status: Abnormal   Collection Time: 03/05/19  6:02 AM  Result Value Ref Range   WBC 7.1 4.0 - 10.5 K/uL   RBC 3.97 (L) 4.22 - 5.81 MIL/uL   Hemoglobin 11.1 (L) 13.0 - 17.0 g/dL   HCT 36.8 (L) 39.0 - 52.0 %   MCV 92.7 80.0 - 100.0 fL   MCH 28.0 26.0 - 34.0 pg   MCHC 30.2 30.0 - 36.0 g/dL   RDW 13.7 11.5 - 15.5 %   Platelets 199 150 - 400 K/uL   nRBC 0.0 0.0 - 0.2 %   Neutrophils Relative % 58 %   Neutro Abs 4.1 1.7 - 7.7 K/uL   Lymphocytes Relative 38 %   Lymphs Abs 2.7 0.7 - 4.0 K/uL   Monocytes Relative 3 %   Monocytes Absolute 0.2 0.1 - 1.0 K/uL   Eosinophils Relative 0 %   Eosinophils Absolute 0.0 0.0 - 0.5 K/uL   Basophils Relative 0 %  Basophils Absolute 0.0 0.0 - 0.1 K/uL   Immature Granulocytes 1 %   Abs Immature Granulocytes 0.06 0.00 - 0.07 K/uL   Reactive, Benign Lymphocytes PRESENT     Comment: SLIDE SENT TO PATHOLOGY FOR REVIEW Performed at [facility], [place] [address] [place], [place] [address]   Comprehensive metabolic panel     Status: Abnormal   Collection Time: 03/05/19  6:02 AM  Result Value Ref Range   Sodium 138 135 - 145 mmol/L   Potassium 4.3 3.5 - 5.1 mmol/L    Comment: SLIGHT HEMOLYSIS   Chloride 104 98 - 111 mmol/L   CO2 22 22 - 32 mmol/L   Glucose, Bld 89 70 - 99 mg/dL   BUN 14 8 - 23 mg/dL   Creatinine, Ser 1.20 0.61 - 1.24 mg/dL   Calcium 7.8 (L) 8.9 - 10.3 mg/dL   Total Protein 6.8 6.5 - 8.1 g/dL   Albumin 2.3 (L) 3.5 - 5.0 g/dL   AST 25 15 - 41 U/L   ALT 13 0 - 44 U/L   Alkaline Phosphatase 38 38 - 126 U/L   Total Bilirubin 0.9 0.3 - 1.2 mg/dL   GFR calc non Af Amer 59 (L)  >60 mL/min   GFR calc Af Amer >60 >60 mL/min   Anion gap 12 5 - 15    Comment: Performed at [facility] [address], [place], [place] [address]  D-dimer, quantitative     Status: Abnormal   Collection Time: 03/05/19  6:02 AM  Result Value Ref Range   D-Dimer, Quant 1.62 (H) 0.00 - 0.50 ug/mL-FEU    Comment: (NOTE) At the manufacturer cut-off of 0.50 ug/mL FEU, this assay has been documented to exclude PE with a sensitivity and negative predictive value of 97 to 99%.  At this time, this assay has not been approved by the FDA to exclude DVT/VTE. Results should be correlated with clinical presentation. Performed at [facility], [place] [address] [place], [place] [address]   Procalcitonin     Status: None   Collection Time: 03/05/19  6:02 AM  Result Value Ref Range   Procalcitonin <0.10 ng/mL    Comment:        Interpretation: PCT (Procalcitonin) <= 0.5 ng/mL: Systemic infection (sepsis) is not likely. Local bacterial infection is possible. (NOTE)       Sepsis PCT Algorithm           Lower Respiratory Tract                                      Infection PCT Algorithm    ----------------------------     ----------------------------         PCT < 0.25 ng/mL                PCT < 0.10 ng/mL         Strongly encourage             Strongly discourage   discontinuation of antibiotics    initiation of antibiotics    ----------------------------     -----------------------------       PCT 0.25 - 0.50 ng/mL            PCT 0.10 - 0.25 ng/mL               OR       >80% decrease  in PCT            Discourage initiation of                                            antibiotics      Encourage discontinuation           of antibiotics    ----------------------------     -----------------------------         PCT >= 0.50 ng/mL              PCT 0.26 - 0.50 ng/mL               AND        <80% decrease in PCT             Encourage initiation of                                              antibiotics       Encourage continuation           of antibiotics    ----------------------------     -----------------------------        PCT >= 0.50 ng/mL                  PCT > 0.50 ng/mL               AND         increase in PCT                  Strongly encourage                                      initiation of antibiotics    Strongly encourage escalation           of antibiotics                                     -----------------------------                                           PCT <= 0.25 ng/mL                                                 OR                                        > 80% decrease in PCT                                     Discontinue / Do not initiate  antibiotics Performed at [facility], [place] [address] [place], [place] [address]   Lactate dehydrogenase     Status: Abnormal   Collection Time: 03/05/19  6:02 AM  Result Value Ref Range   LDH 329 (H) 98 - 192 U/L    Comment: Performed at [facility], [place] [address], [place], [place] [address]  Ferritin     Status: Abnormal   Collection Time: 03/05/19  6:02 AM  Result Value Ref Range   Ferritin 400 (H) 24 - 336 ng/mL    Comment: Performed at [facility], [place] [address] [place], [place] [address]  Triglycerides     Status: None   Collection Time: 03/05/19  6:02 AM  Result Value Ref Range   Triglycerides 95 <150 mg/dL    Comment: Performed at [facility] [address] [place], [place] [address]  Fibrinogen     Status: Abnormal   Collection Time: 03/05/19  6:02 AM  Result Value Ref Range   Fibrinogen 728 (H) 210 - 475 mg/dL    Comment: Performed at [facility] [address] [place], [place] [address]  C-reactive protein     Status: Abnormal   Collection Time: 03/05/19  6:02 AM  Result Value Ref Range   CRP 11.4 (H) <1.0 mg/dL    Comment: Performed at [facility] [address] [place], [place]  [address]  Troponin I (High Sensitivity)     Status: None   Collection Time: 03/05/19  6:02 AM  Result Value Ref Range   Troponin I (High Sensitivity) 4 <18 ng/L    Comment: (NOTE) Elevated high sensitivity troponin I (hsTnI) values and significant  changes across serial measurements may suggest ACS but many other  chronic and acute conditions are known to elevate hsTnI results.  Refer to the "Links" section for chest pain algorithms and additional  guidance. Performed at [facility], [place] [address], [place], [place] [address]   CBG monitoring, ED     Status: Abnormal   Collection Time: 03/05/19  6:08 AM  Result Value Ref Range   Glucose-Capillary 55 (L) 70 - 99 mg/dL  Lactic acid, plasma     Status: None   Collection Time: 03/05/19  6:12 AM  Result Value Ref Range   Lactic Acid, Venous 1.5 0.5 - 1.9 mmol/L    Comment: Performed at [facility], [place] [address], [place], [place] [address]  CBG monitoring, ED     Status: None   Collection Time: 03/05/19  6:39 AM  Result Value Ref Range   Glucose-Capillary 80 70 - 99 mg/dL  POC SARS Coronavirus 2 Ag-ED - Nasal Swab (BD Veritor Kit)     Status: Abnormal   Collection Time: 03/05/19  6:55 AM  Result Value Ref Range   SARS Coronavirus 2 Ag POSITIVE (A) NEGATIVE    Comment: (NOTE) SARS-CoV-2 antigen PRESENT. Positive results indicate the presence of viral antigens, but clinical correlation with patient history and other diagnostic information is necessary to determine patient infection status.  Positive results do not rule out bacterial infection or co-infection  with other viruses. False positive results are rare but can occur, and confirmatory RT-PCR testing may be appropriate in some circumstances. The expected result is Negative. Fact Sheet for Patients: PodPark.tn Fact Sheet for Providers: GiftContent.is  This test is not yet approved or cleared by the [place] and  has been authorized for  detection and/or diagnosis of SARS-CoV-2 by FDA under an Emergency Use Authorization (EUA).  This EUA will remain in effect (meaning this test can be used) for the duration of  the COVID-19 declaration under Section 564(b)(1) of the Act, 21 U.S.C. section 360bbb-3(b)(1), unless the a uthorization is terminated or revoked sooner.   Troponin I (High Sensitivity)     Status: None   Collection Time: 03/05/19  7:58 AM  Result Value Ref Range   Troponin I (High Sensitivity) 4 <18 ng/L    Comment: (NOTE) Elevated high sensitivity troponin I (hsTnI) values and significant  changes across serial measurements may suggest ACS but many other  chronic and acute conditions are known to elevate hsTnI results.  Refer to the "Links" section for chest pain algorithms and additional  guidance. Performed at [facility], [place] [address], [place], [place] [address]   CBG monitoring, ED     Status: Abnormal   Collection Time: 03/05/19  4:46 PM  Result Value Ref Range   Glucose-Capillary 138 (H) 70 - 99 mg/dL   DG Chest Port 1 View  Result Date: 03/05/2019 CLINICAL DATA:  COVID positive EXAM: PORTABLE CHEST 1 VIEW COMPARISON:  02/09/2017 FINDINGS: Normal heart size and mild aortic tortuosity. There could be subtle infiltrate in the right mid lung. No edema, effusion, or pneumothorax. VP shunt traversing the right chest. IMPRESSION: Equivocal for early pneumonia on the right. Electronically Signed   By: Monte Fantasia M.D.   On: 03/05/2019 06:44    Pending Labs Unresulted Labs (From admission, onward)    Start     Ordered   03/06/19 0500  CBC with Differential/Platelet  Daily,   R     03/05/19 1011   03/06/19 0500  Comprehensive metabolic panel  Daily,   R     03/05/19 1011   03/06/19 0500  C-reactive protein  Daily,   R     03/05/19 1011   03/06/19 0500  D-dimer, quantitative (not at [facility])  Daily,   R     03/05/19 1011   03/06/19 0500  Ferritin  Daily,    R     03/05/19 1011   03/06/19 0500  Magnesium  Daily,   R     03/05/19 1011   03/06/19 0500  Phosphorus  Daily,   R     03/05/19 1011   03/05/19 0602  Pathologist smear review  Once,   R     03/05/19 0602   03/05/19 0549  Blood Culture (routine x 2)  BLOOD CULTURE X 2,   STAT     03/05/19 0549          Vitals/Pain Today's Vitals   03/05/19 1830 03/05/19 1900 03/05/19 1915 03/05/19 1945  BP: 133/73 (!) 146/67 (!) 141/73 140/69  Pulse: (!) 47 (!) 45 (!) 50 (!) 44  Resp: 20 20 (!) 27 19  Temp:      TempSrc:      SpO2: 96% 99% 97% 99%  Weight:      Height:      PainSc:        Isolation Precautions Airborne and Contact precautions  Medications Medications  enoxaparin (LOVENOX) injection 40 mg (40 mg Subcutaneous Given 03/05/19 1111)  sodium chloride flush (NS) 0.9 % injection 3 mL (3 mLs Intravenous Not Given 03/05/19 1021)  sodium chloride flush (NS) 0.9 % injection 3 mL (has no administration in time range)  0.9 %  sodium chloride infusion (has no administration in  time range)  albuterol (VENTOLIN HFA) 108 (90 Base) MCG/ACT inhaler 2 puff (has no administration in time range)  guaiFENesin-dextromethorphan (ROBITUSSIN DM) 100-10 MG/5ML syrup 10 mL (has no administration in time range)  chlorpheniramine-HYDROcodone (TUSSIONEX) 10-8 MG/5ML suspension 5 mL (has no administration in time range)  acetaminophen (TYLENOL) tablet 650 mg (has no administration in time range)  oxyCODONE (Oxy IR/ROXICODONE) immediate release tablet 5 mg (has no administration in time range)  polyethylene glycol (MIRALAX / GLYCOLAX) packet 17 g (has no administration in time range)  bisacodyl (DULCOLAX) EC tablet 5 mg (has no administration in time range)  sodium phosphate (FLEET) 7-19 GM/118ML enema 1 enema (has no administration in time range)  ondansetron (ZOFRAN) tablet 4 mg (has no administration in time range)    Or  ondansetron (ZOFRAN) injection 4 mg (has no administration in time range)   dexamethasone (DECADRON) injection 6 mg (has no administration in time range)  remdesivir 200 mg in sodium chloride 0.9% 250 mL IVPB (0 mg Intravenous Stopped 03/05/19 1040)    Followed by  remdesivir 100 mg in sodium chloride 0.9 % 100 mL IVPB (has no administration in time range)  dexamethasone (DECADRON) injection 6 mg (6 mg Intravenous Given 03/05/19 0843)    Mobility non-ambulatory High fall risk   Focused Assessments   R Recommendations: See Admitting Provider Note  Report given to:   Additional Notes:

## 2019-03-05 NOTE — H&P (Signed)
History and Physical    Jeffrey Huerta [id number] DOB: [birth year] DOA: 03/05/2019  PCP: Nolene Ebbs, MD Consultants:  Krista Blue - neurology Patient coming from:  Home, goes to adult day care - lives with ; [facility]: Wife, Otho Rodela, [phone]  Chief Complaint: AMS, hypoxia with known COVID-19 infection  HPI: Jeffrey Huerta is a 74 y.o. male with medical history significant of HTN; dementia; hydrocephalus with VP shunt associated with aneurysm/AVM presenting with AMS, hypoxia.  Patient reports feeling well, no complaints.  I spoke with his wife and daughter.  This morning, he was sitting on a stool in the bathroom and wasn't talking.  He would not respond.  He was "just limp" but he wasn't falling off the stool.  He was mumbling a little.  They called 911 and he had started talking a little bit more.   He looked very tired, and has been complaining of that since he returned home from [place].  He was exposed to COVID at the day program at [facility] - it was shut down and he was sent home about 1-2 weeks ago.  He had a negative test a week ago.  Sunday, he fell out of a chair but seemed to be ok.  He had fevers that night.  He seemed more tired, wanted to stay in bed more.  They took him to the [facility] Monday for repeat testing and he was seen Tuesday by his PCP and was given IVF (concern for dehydration).  They were called Wednesday with the positive test result.   ED Course:  Pleasantly demented.  Chronic recurrent syncope - ?related to dehydration.  +COVID, on 1-2L O2 currently.  Glucose 55, fed, up to 80.  Review of Systems: Unable to perform    Past Medical History:  Diagnosis Date  . Aneurysm ([place])   . AVM (arteriovenous malformation)   . Dementia ([place])   . Hydrocephalus ([place])    VP shunt in place  . Hypertension     Past Surgical History:  Procedure Laterality Date  . VENTRICULOPERITONEAL SHUNT     prior to 2008    Social History   Socioeconomic History  . Marital status:  Married    Spouse name: Not on file  . Number of children: 3  . Years of education: 10th  . Highest education level: Not on file  Occupational History  . Occupation: Retired  Tobacco Use  . Smoking status: Never Smoker  . Smokeless tobacco: Never Used  Substance and Sexual Activity  . Alcohol use: No    Alcohol/week: 0.0 standard drinks  . Drug use: No  . Sexual activity: Not on file  Other Topics Concern  . Not on file  Social History Narrative   Lives at home with wife.   Right-handed.   No caffeine use.   Social Determinants of Health   Financial Resource Strain:   . Difficulty of Paying Living Expenses: Not on file  Food Insecurity:   . Worried About Charity fundraiser in the Last Year: Not on file  . Ran Out of Food in the Last Year: Not on file  Transportation Needs:   . Lack of Transportation (Medical): Not on file  . Lack of Transportation (Non-Medical): Not on file  Physical Activity:   . Days of Exercise per Week: Not on file  . Minutes of Exercise per Session: Not on file  Stress:   . Feeling of Stress : Not on file  Social Connections:   .  Frequency of Communication with Friends and Family: Not on file  . Frequency of Social Gatherings with Friends and Family: Not on file  . Attends Religious Services: Not on file  . Active Member of Clubs or Organizations: Not on file  . Attends Archivist Meetings: Not on file  . Marital Status: Not on file  Intimate Partner Violence:   . Fear of Current or Ex-Partner: Not on file  . Emotionally Abused: Not on file  . Physically Abused: Not on file  . Sexually Abused: Not on file    No Known Allergies  Family History  Problem Relation Age of Onset  . Stroke Mother   . Hypertension Mother     Prior to Admission medications   Medication Sig Start Date End Date Taking? Authorizing Provider  atorvastatin (LIPITOR) 40 MG tablet Take 40 mg by mouth daily.    [provider]  donepezil (ARICEPT)  10 MG tablet Take 10 mg by mouth at bedtime.    [provider]  losartan (COZAAR) 50 MG tablet Take 50 mg by mouth.    [provider]    Physical Exam: Vitals:   03/05/19 1445 03/05/19 1500 03/05/19 1515 03/05/19 1530  BP: 118/66 111/61 114/61 117/68  Pulse: (!) 49 (!) 54 (!) 57 (!) 50  Resp: (!) 25 (!) 25 (!) 24 (!) 21  Temp:      TempSrc:      SpO2: 100% 100% 100% 99%  Weight:      Height:         . General:  Appears calm and comfortable and is NAD; he sang praise songs to me while I was in the room . Eyes:  PERRL, EOMI, normal lids, iris . ENT:  grossly normal hearing, lips & tongue, mmm . Neck:  no LAD, masses or thyromegaly . Cardiovascular:  RRR, no m/r/g. No LE edema.  Marland Kitchen Respiratory:   CTA bilaterally with no wheezes/rales/rhonchi.  Mildly increased respiratory effort. . Abdomen:  soft, NT, ND, NABS . Skin:  no rash or induration seen on limited exam . Musculoskeletal:  grossly normal tone BUE/BLE, good ROM, no bony abnormality . Psychiatric:  Pleasantly demented mood and affect, speech fluent and appropriate, AOx1 Neurologic:  CN 2-12 grossly intact, moves all extremities in coordinated fashion   Radiological Exams on Admission: DG Chest Port 1 View  Result Date: 03/05/2019 CLINICAL DATA:  COVID positive EXAM: PORTABLE CHEST 1 VIEW COMPARISON:  02/09/2017 FINDINGS: Normal heart size and mild aortic tortuosity. There could be subtle infiltrate in the right mid lung. No edema, effusion, or pneumothorax. VP shunt traversing the right chest. IMPRESSION: Equivocal for early pneumonia on the right. Electronically Signed   By: Monte Fantasia M.D.   On: 03/05/2019 06:44    EKG: Independently reviewed.  NSR with rate 57; nonspecific ST changes with no evidence of acute ischemia; NSCSLT   Labs on Admission: I have personally reviewed the available labs and imaging studies at the time of the admission.  Pertinent labs:   Albumin 2.3 LDH 329 HS troponin  4 Ferritin 400 CRP 11.4 Lactate 1.5 WBC 7.1, normal differential Hgb 11.1 D-dimer 1.62 Fibrinogen 728 COVID POSITIVE   Assessment/Plan Principal Problem:   Acute respiratory disease due to COVID-19 virus Active Problems:   Dementia (HCC)   Essential hypertension     Acute respiratory failure with hypoxia due to COVID-19 infection -Patient with presenting with unresponsiveness, was mildly hypoglycemic while in the ER and also mildly  hypoxic -He does not have a usual home O2 requirement and is currently requiring 1-2L [place] O2 -COVID POSITIVE -The patient has comorbidities which may increase the risk for ARDS/MODS including: age, HTN -Pertinent labs concerning for COVID include normal WBC count; low procalcitonin; markedly elevated D-dimer (>1); markedly elevated CRP (>7); increased LDH; increased ferritin; increased fibrinogen -CXR with possible early RLL PNA  -Will not treat with broad-spectrum antibiotics given procalcitonin <0.1 -Will admit to [facility] for further evaluation, close monitoring, and treatment -Monitor on telemetry x at least 24 hours -At this time, will attempt to avoid use of aerosolized medications and use HFAs instead -Will check daily labs including BMP with Mag, Phos; LFTs; CBC with differential; CRP; ferritin; fibrinogen; D-dimer -Will order steroids (1 mg/kg divided BID) and Remdesivir (pharmacy consult) given +COVID test, +CXR, and hypoxia <94% on room air -Will attempt to maintain euvolemia to a net negative fluid status -With D-dimer <5, will use standard-dosed Lovenox for DVT prevention -Patient was seen wearing full PPE including: gown, gloves, head cover, N95, and face shield; donning and doffing was in compliance with current standards.  Hypoglycemia -Patient with glucose down to 55 this AM in the ER, and this may have been related to the staring off/unresponsiveness that his daughter reported -He is not on DM medications -Suggest that the hypoglycemia  was related to insufficient caloric intake in the setting of COVID-19 infection -Will add Accuchecks prn  -Will feed patient and anticipate resolution with initiation of steroids for COVID-19 infection  Dementia -he does not appear to be taking medication for this issue; previously on Aricept and Namenda but he refused medications and so those were stopped -He attends the adult day care program at [facility] [place] and is thought to have been infected there  HTN -He is reportedly on Cozaar at home but the [facility] suggests that the patient takes this inconsistently and refuses it -Since his BP is controlled at this time, will plan to hold this medication for now    DVT prophylaxis:  Lovenox  Code Status:  Full - confirmed with family Family Communication: None present; I spoke with the patient's daughter and wife by telephone. Disposition Plan:  Home once clinically improved Consults called: None  Admission status: Admit - It is my clinical opinion that admission to INPATIENT is reasonable and necessary because of the expectation that this patient will require hospital care that crosses at least 2 midnights to treat this condition based on the medical complexity of the problems presented.  Given the aforementioned information, the predictability of an adverse outcome is felt to be significant.   Karmen Bongo MD [facility]   How to contact the [facility] Attending or Consulting provider [place] or covering provider during after hours [place], for this patient?  1. Check the care team in [facility] and look for a) attending/consulting TRH provider listed and b) the [facility] team listed 2. Log into [email or web address] and use [facility]'s universal password to access. If you do not have the password, please contact the hospital operator. 3. Locate the [facility] provider you are looking for under [facility] and page to a number that you can be directly reached. 4. If you still have difficulty reaching the provider,  please page the [facility] (Director on Call) for the Hospitalists listed on amion for assistance.   03/05/2019, 4:04 PM

## 2019-03-05 NOTE — ED Notes (Signed)
Pt resting on cart in NAD. VSS per baseline on monitors. Equal rise and fall of chest noted. Breathing easy, non-labored. Hourly rounds completed. Call light within reach. Will continue to monitor

## 2019-03-05 NOTE — ED Notes (Signed)
x1 unsuccessful attempt to call report. RN unavailable at this time

## 2019-03-05 NOTE — ED Notes (Signed)
MD notified of patient CBG 55. Pt provided oral hydration per MD.

## 2019-03-06 DIAGNOSIS — G309 Alzheimer's disease, unspecified: Secondary | ICD-10-CM

## 2019-03-06 DIAGNOSIS — E162 Hypoglycemia, unspecified: Secondary | ICD-10-CM

## 2019-03-06 DIAGNOSIS — F0280 Dementia in other diseases classified elsewhere without behavioral disturbance: Secondary | ICD-10-CM

## 2019-03-06 DIAGNOSIS — I10 Essential (primary) hypertension: Secondary | ICD-10-CM

## 2019-03-06 LAB — COMPREHENSIVE METABOLIC PANEL
ALT: 16 U/L (ref 0–44)
AST: 21 U/L (ref 15–41)
Albumin: 2.3 g/dL — ABNORMAL LOW (ref 3.5–5.0)
Alkaline Phosphatase: 36 U/L — ABNORMAL LOW (ref 38–126)
Anion gap: 9 (ref 5–15)
BUN: 18 mg/dL (ref 8–23)
CO2: 24 mmol/L (ref 22–32)
Calcium: 8.3 mg/dL — ABNORMAL LOW (ref 8.9–10.3)
Chloride: 108 mmol/L (ref 98–111)
Creatinine, Ser: 0.82 mg/dL (ref 0.61–1.24)
GFR calc Af Amer: >60 mL/min (ref >60)
GFR calc non Af Amer: >60 mL/min (ref >60)
Glucose, Bld: 93 mg/dL (ref 70–99)
Potassium: 4.8 mmol/L (ref 3.5–5.1)
Sodium: 141 mmol/L (ref 135–145)
Total Bilirubin: 0.5 mg/dL (ref 0.3–1.2)
Total Protein: 7 g/dL (ref 6.5–8.1)

## 2019-03-06 LAB — CBC WITH DIFFERENTIAL/PLATELET
Abs Immature Granulocytes: 0.08 10*3/uL — ABNORMAL HIGH (ref 0.00–0.07)
Basophils Absolute: 0 10*3/uL (ref 0.0–0.1)
Basophils Relative: 0 %
Eosinophils Absolute: 0 10*3/uL (ref 0.0–0.5)
Eosinophils Relative: 0 %
HCT: 29.3 % — ABNORMAL LOW (ref 39.0–52.0)
Hemoglobin: 8.9 g/dL — ABNORMAL LOW (ref 13.0–17.0)
Immature Granulocytes: 1 %
Lymphocytes Relative: 42 %
Lymphs Abs: 3.4 10*3/uL (ref 0.7–4.0)
MCH: 27.6 pg (ref 26.0–34.0)
MCHC: 30.4 g/dL (ref 30.0–36.0)
MCV: 90.7 fL (ref 80.0–100.0)
Monocytes Absolute: 0.4 10*3/uL (ref 0.1–1.0)
Monocytes Relative: 5 %
Neutro Abs: 4.2 10*3/uL (ref 1.7–7.7)
Neutrophils Relative %: 52 %
Platelets: 251 10*3/uL (ref 150–400)
RBC: 3.23 MIL/uL — ABNORMAL LOW (ref 4.22–5.81)
RDW: 13.6 % (ref 11.5–15.5)
WBC: 8.1 10*3/uL (ref 4.0–10.5)
nRBC: 0 % (ref 0.0–0.2)

## 2019-03-06 LAB — GLUCOSE, CAPILLARY
Glucose-Capillary: 32 mg/dL — CL (ref 70–99)
Glucose-Capillary: 34 mg/dL — CL (ref 70–99)
Glucose-Capillary: 33 mg/dL — CL (ref 70–99)
Glucose-Capillary: 83 mg/dL (ref 70–99)
Glucose-Capillary: 91 mg/dL (ref 70–99)

## 2019-03-06 LAB — FERRITIN: Ferritin: 465 ng/mL — ABNORMAL HIGH (ref 24–336)

## 2019-03-06 LAB — MAGNESIUM: Magnesium: 2.4 mg/dL (ref 1.7–2.4)

## 2019-03-06 LAB — PHOSPHORUS: Phosphorus: 4 mg/dL (ref 2.5–4.6)

## 2019-03-06 LAB — D-DIMER, QUANTITATIVE: D-Dimer, Quant: 1.75 ug/mL-FEU — ABNORMAL HIGH (ref 0.00–0.50)

## 2019-03-06 LAB — C-REACTIVE PROTEIN: CRP: 12.3 mg/dL — ABNORMAL HIGH (ref <1.0)

## 2019-03-06 LAB — GLUCOSE, RANDOM: Glucose, Bld: 253 mg/dL — ABNORMAL HIGH (ref 70–99)

## 2019-03-06 MED ORDER — DEXTROSE 50 % IV SOLN
INTRAVENOUS | Status: AC
  Administered 2019-03-06: 50 mL
  Filled 2019-03-06: qty 50

## 2019-03-06 MED ORDER — ENSURE ENLIVE PO LIQD
237.0000 mL | Freq: Three times a day (TID) | ORAL | Status: DC
  Administered 2019-03-06 – 2019-03-09 (×11): 237 mL via ORAL

## 2019-03-06 NOTE — Progress Notes (Signed)
PROGRESS NOTE    Jeffrey Huerta  [id number] DOB: 1946-02-08 DOA: 03/05/2019 PCP: Jeffrey Ebbs, MD   Brief Narrative:   74 year old gentleman prior history of hypertension, dementia, hydrocephalus with VP shunt presents with hypoxia and altered mental status.  On arrival to ED he was tested positive for Covid and required up to 2 L of nasal cannula oxygen to keep sats greater than 90%.  He was admitted for acute respiratory failure with mild hypoxia secondary to Covid 19 viral infection. Assessment & Plan:   Principal Problem:   Acute respiratory disease due to COVID-19 virus Active Problems:   Dementia (Jeffrey Huerta)   Essential hypertension   Acute respiratory failure with hypoxia secondary to COVID-19 viral infection Hypoxia improving currently on 1 L of nasal cannula oxygen.  Chest x-ray shows possible right lower lobe pneumonia. Day 2 of remdesivir and on IV steroids currently. Follow inflammatory markers. Recommend incentive spirometry. Patient remains afebrile.    Essential hypertension Blood pressure parameters are well controlled.   Hypoglycemia Unclear etiology possibly from insufficient calorie intake in the setting of COVID-19 infection. Continue with Accu-Cheks as needed.    Dementia Without any behavioral abnormalities   DVT prophylaxis: lovenox.  Code Status:  Full code.  Family Communication: discussed with wife over the phone.  Disposition Plan: possibly in the next 2 days.    Consultants:   None.   Procedures: none.  Antimicrobials: none.    Subjective: No new complaints.   Objective: Vitals:   03/05/19 2237 03/05/19 2303 03/06/19 0046 03/06/19 0759  BP:  (!) 143/71  (!) 136/94  Pulse:  (!) 42  (!) 53  Resp: 14   17  Temp:  98.1 F (36.7 C)  98.1 F (36.7 C)  TempSrc:  Oral  Oral  SpO2:  (!) 72% 98%   Weight:      Height:        Intake/Output Summary (Last 24 hours) at 03/06/2019 1638 Last data filed at 03/06/2019 0600 Gross per 24  hour  Intake --  Output 150 ml  Net -150 ml   Filed Weights   03/05/19 0624  Weight: 68 kg    Examination:  General exam: Appears calm and comfortable  Respiratory system: Clear to auscultation. Respiratory effort normal. Cardiovascular system: S1 & S2 heard, RRR. No JVD,No pedal edema. Gastrointestinal system: Abdomen is nondistended, soft and nontender.  Normal bowel sounds heard. Central nervous system: Alert and oriented. No focal neurological deficits. Extremities: Symmetric 5 x 5 power. Skin: No rashes, lesions or ulcers Psychiatry: Mood & affect appropriate.     Data Reviewed: I have personally reviewed following labs and imaging studies  CBC: Recent Labs  Lab 03/05/19 0602 03/06/19 0638  WBC 7.1 8.1  NEUTROABS 4.1 4.2  HGB 11.1* 8.9*  HCT 36.8* 29.3*  MCV 92.7 90.7  PLT 199 [id number]   Basic Metabolic Panel: Recent Labs  Lab 03/05/19 0602 03/06/19 0638 03/06/19 0933  NA 138 141  --   K 4.3 4.8  --   CL 104 108  --   CO2 22 24  --   GLUCOSE 89 93 253*  BUN 14 18  --   CREATININE 1.20 0.82  --   CALCIUM 7.8* 8.3*  --   MG  --  2.4  --   PHOS  --  4.0  --    GFR: Estimated Creatinine Clearance: 73.9 mL/min (by C-G formula based on SCr of 0.82 mg/dL). Liver Function Tests: Recent Labs  Lab 03/05/19  0602 03/06/19 0638  AST 25 21  ALT 13 16  ALKPHOS 38 36*  BILITOT 0.9 0.5  PROT 6.8 7.0  ALBUMIN 2.3* 2.3*   No results for input(s): LIPASE, AMYLASE in the last 168 hours. No results for input(s): AMMONIA in the last 168 hours. Coagulation Profile: No results for input(s): INR, PROTIME in the last 168 hours. Cardiac Enzymes: No results for input(s): CKTOTAL, CKMB, CKMBINDEX, TROPONINI in the last 168 hours. BNP (last 3 results) No results for input(s): PROBNP in the last 8760 hours. HbA1C: No results for input(s): HGBA1C in the last 72 hours. CBG: Recent Labs  Lab 03/05/19 1646 03/06/19 0800 03/06/19 0827 03/06/19 0904 03/06/19 1231   GLUCAP 138* 34* 32* 33* 83   Lipid Profile: Recent Labs    03/05/19 0602  TRIG 95   Thyroid Function Tests: No results for input(s): TSH, T4TOTAL, FREET4, T3FREE, THYROIDAB in the last 72 hours. Anemia Panel: Recent Labs    03/05/19 0602 03/06/19 0638  FERRITIN 400* 465*   Sepsis Labs: Recent Labs  Lab 03/05/19 0602 03/05/19 0612  PROCALCITON <0.10  --   LATICACIDVEN  --  1.5    Recent Results (from the past 240 hour(s))  Blood Culture (routine x 2)     Status: None (Preliminary result)   Collection Time: 03/05/19  7:56 AM   Specimen: BLOOD RIGHT HAND  Result Value Ref Range Status   Specimen Description BLOOD RIGHT HAND  Final   Special Requests   Final    BOTTLES DRAWN AEROBIC AND ANAEROBIC Blood Culture adequate volume   Culture   Final    NO GROWTH < 24 HOURS Performed at [facility], [place] [address] [place], [place] [address]    Report Status PENDING  Incomplete  Blood Culture (routine x 2)     Status: None (Preliminary result)   Collection Time: 03/05/19  8:00 AM   Specimen: BLOOD  Result Value Ref Range Status   Specimen Description BLOOD RIGHT ANTECUBITAL  Final   Special Requests   Final    BOTTLES DRAWN AEROBIC AND ANAEROBIC Blood Culture adequate volume   Culture   Final    NO GROWTH < 24 HOURS Performed at [facility], [place] [address], [place], [place] [address]    Report Status PENDING  Incomplete         Radiology Studies: DG Chest Port 1 View  Result Date: 03/05/2019 CLINICAL DATA:  COVID positive EXAM: PORTABLE CHEST 1 VIEW COMPARISON:  02/09/2017 FINDINGS: Normal heart size and mild aortic tortuosity. There could be subtle infiltrate in the right mid lung. No edema, effusion, or pneumothorax. VP shunt traversing the right chest. IMPRESSION: Equivocal for early pneumonia on the right. Electronically Signed   By: Monte Fantasia M.D.   On: 03/05/2019 06:44        Scheduled Meds: . dexamethasone (DECADRON) injection  6  mg Intravenous Q24H  . enoxaparin (LOVENOX) injection  40 mg Subcutaneous Daily  . feeding supplement (ENSURE ENLIVE)  237 mL Oral TID BM  . sodium chloride flush  3 mL Intravenous Q12H   Continuous Infusions: . sodium chloride    . remdesivir 100 mg in NS 100 mL 100 mg (03/06/19 1139)     LOS: 1 day        Hosie Poisson, MD [facility] 03/06/2019, 4:38 PM

## 2019-03-07 LAB — CBC WITH DIFFERENTIAL/PLATELET
Abs Immature Granulocytes: 0 10*3/uL (ref 0.00–0.07)
Band Neutrophils: 3 %
Basophils Absolute: 0 10*3/uL (ref 0.0–0.1)
Basophils Relative: 0 %
Eosinophils Absolute: 0 10*3/uL (ref 0.0–0.5)
Eosinophils Relative: 0 %
HCT: 36.2 % — ABNORMAL LOW (ref 39.0–52.0)
Hemoglobin: 11 g/dL — ABNORMAL LOW (ref 13.0–17.0)
Lymphocytes Relative: 37 %
Lymphs Abs: 2.5 10*3/uL (ref 0.7–4.0)
MCH: 27.6 pg (ref 26.0–34.0)
MCHC: 30.4 g/dL (ref 30.0–36.0)
MCV: 91 fL (ref 80.0–100.0)
Monocytes Absolute: 0.2 10*3/uL (ref 0.1–1.0)
Monocytes Relative: 3 %
Neutro Abs: 4.1 10*3/uL (ref 1.7–7.7)
Neutrophils Relative %: 57 %
Platelets: 214 10*3/uL (ref 150–400)
RBC: 3.98 MIL/uL — ABNORMAL LOW (ref 4.22–5.81)
RDW: 13.6 % (ref 11.5–15.5)
WBC Morphology: ABNORMAL
WBC: 6.8 10*3/uL (ref 4.0–10.5)
nRBC: 0 % (ref 0.0–0.2)

## 2019-03-07 LAB — GLUCOSE, CAPILLARY
Glucose-Capillary: 109 mg/dL — ABNORMAL HIGH (ref 70–99)
Glucose-Capillary: 37 mg/dL — CL (ref 70–99)
Glucose-Capillary: 146 mg/dL — ABNORMAL HIGH (ref 70–99)
Glucose-Capillary: 96 mg/dL (ref 70–99)
Glucose-Capillary: 93 mg/dL (ref 70–99)
Glucose-Capillary: 179 mg/dL — ABNORMAL HIGH (ref 70–99)

## 2019-03-07 LAB — C-REACTIVE PROTEIN: CRP: 6.4 mg/dL — ABNORMAL HIGH (ref <1.0)

## 2019-03-07 LAB — FERRITIN: Ferritin: 635 ng/mL — ABNORMAL HIGH (ref 24–336)

## 2019-03-07 LAB — COMPREHENSIVE METABOLIC PANEL
ALT: 19 U/L (ref 0–44)
AST: 27 U/L (ref 15–41)
Albumin: 2.3 g/dL — ABNORMAL LOW (ref 3.5–5.0)
Alkaline Phosphatase: 38 U/L (ref 38–126)
Anion gap: 10 (ref 5–15)
BUN: 22 mg/dL (ref 8–23)
CO2: 24 mmol/L (ref 22–32)
Calcium: 8 mg/dL — ABNORMAL LOW (ref 8.9–10.3)
Chloride: 106 mmol/L (ref 98–111)
Creatinine, Ser: 0.82 mg/dL (ref 0.61–1.24)
GFR calc Af Amer: >60 mL/min (ref >60)
GFR calc non Af Amer: >60 mL/min (ref >60)
Glucose, Bld: 176 mg/dL — ABNORMAL HIGH (ref 70–99)
Potassium: 4.5 mmol/L (ref 3.5–5.1)
Sodium: 140 mmol/L (ref 135–145)
Total Bilirubin: 0.6 mg/dL (ref 0.3–1.2)
Total Protein: 6.5 g/dL (ref 6.5–8.1)

## 2019-03-07 LAB — RETICULOCYTES
Immature Retic Fract: 9.6 % (ref 2.3–15.9)
RBC.: 4.17 MIL/uL — ABNORMAL LOW (ref 4.22–5.81)
Retic Count, Absolute: 32.1 10*3/uL (ref 19.0–186.0)
Retic Ct Pct: 0.8 % (ref 0.4–3.1)

## 2019-03-07 LAB — VITAMIN B12: Vitamin B-12: 573 pg/mL (ref 180–914)

## 2019-03-07 LAB — FOLATE: Folate: 12.7 ng/mL (ref >5.9)

## 2019-03-07 LAB — HEMOGLOBIN A1C
Hgb A1c MFr Bld: 5.6 % (ref 4.8–5.6)
Mean Plasma Glucose: 114.02 mg/dL

## 2019-03-07 LAB — MAGNESIUM: Magnesium: 2.3 mg/dL (ref 1.7–2.4)

## 2019-03-07 LAB — PHOSPHORUS: Phosphorus: 2.9 mg/dL (ref 2.5–4.6)

## 2019-03-07 LAB — IRON AND TIBC
Iron: 28 ug/dL — ABNORMAL LOW (ref 45–182)
Saturation Ratios: 14 % — ABNORMAL LOW (ref 17.9–39.5)
TIBC: 199 ug/dL — ABNORMAL LOW (ref 250–450)
UIBC: 171 ug/dL

## 2019-03-07 LAB — D-DIMER, QUANTITATIVE: D-Dimer, Quant: >20 ug/mL-FEU — ABNORMAL HIGH (ref 0.00–0.50)

## 2019-03-07 MED ORDER — SODIUM CHLORIDE 0.9 % IV SOLN: INTRAVENOUS | Status: DC

## 2019-03-07 MED ORDER — DEXTROSE 50 % IV SOLN
INTRAVENOUS | Status: AC
  Administered 2019-03-07: 50 mL
  Filled 2019-03-07: qty 50

## 2019-03-07 MED ORDER — ENOXAPARIN SODIUM 40 MG/0.4ML ~~LOC~~ SOLN
40.0000 mg | Freq: Two times a day (BID) | SUBCUTANEOUS | Status: DC
  Administered 2019-03-07: 40 mg via SUBCUTANEOUS
  Filled 2019-03-07: qty 0.4

## 2019-03-07 NOTE — Progress Notes (Signed)
Hypoglycemic Event  CBG: 37 @0723   Treatment: D50 50 mL (25 gm)  Symptoms: None  Follow-up CBG: Time:0808 CBG Result:146 Possible Reasons for Event: inadequate meal   Comments/MD notified: Dr Karleen Hampshire was made aware    Rosalyn Gess

## 2019-03-07 NOTE — Evaluation (Signed)
Physical Therapy Evaluation Patient Details Name: Jeffrey Huerta MRN: [id number] DOB: 29-Nov-1945 Today's Date: 03/07/2019   History of Present Illness  74 year old gentleman prior history of hypertension, dementia, hydrocephalus with VP shunt presents with hypoxia and altered mental status.  On arrival to ED he was tested positive for Covid and required up to 2 L of nasal cannula oxygen to keep sats greater than 90%.  He was admitted for acute respiratory failure with mild hypoxia secondary to Covid 19 viral infection.  Clinical Impression   Pt admitted with above diagnosis. Comes from home, walking without an assistvie device, needing assist for bathroom ADLs due to fall risk; Presents to PT with generalized weakness, and notable orthostatic hypotension, which is significantly impacting his activity tolerance;  Pt currently with functional limitations due to the deficits listed below (see PT Problem List). Pt will benefit from skilled PT to increase their independence and safety with mobility to allow discharge to the venue listed below.       Follow Up Recommendations Home health PT;Supervision/Assistance - 24 hour    Equipment Recommendations  Rolling walker with 5" wheels;3in1 (PT);Other (comment)(will discern optimal Assistive device over next few sessions)    Recommendations for Other Services OT consult(for ADLs in consideration to go home)     Precautions / Restrictions Precautions Precautions: Fall Precaution Comments: Orthostatic on eval      Mobility  Bed Mobility Overal bed mobility: Needs Assistance Bed Mobility: Supine to Sit     Supine to sit: Supervision     General bed mobility comments: Cues for initiating and assist for lines; good job getting himself to EOB  Transfers Overall transfer level: Needs assistance Equipment used: 1 person hand held assist Transfers: Sit to/from Stand Sit to Stand: Mod assist         General transfer comment: Light mod assist  to power up, and handheld assist to steady; Became notably less interactive in standing, and assisted him to sitting; then opted to run orthostatics  Ambulation/Gait             General Gait Details: unable to day due to orthostatic hypotension  Stairs            Wheelchair Mobility    Modified Rankin (Stroke Patients Only)       Balance Overall balance assessment: Needs assistance   Sitting balance-Leahy Scale: Fair       Standing balance-Leahy Scale: Poor Standing balance comment: tolerated standing less 30 seconds                             Pertinent Vitals/Pain Pain Assessment: No/denies pain    Home Living Family/patient expects to be discharged to:: Private residence Living Arrangements: Spouse/significant other Available Help at Discharge: Family Type of Home: House Home Access: Stairs to enter Entrance Stairs-Rails: None Technical brewer of Steps: 1 Home Layout: One level   Additional Comments: "wobbly" per wife, but no falls until this recent Covid dx    Prior Function Level of [place]: Needs assistance   Gait / Transfers Assistance Needed: walks without AD, "wobbly" per wife, but no falls until this recent Covid dx  ADL's / Homemaking Assistance Needed: Assist in the bathroom with bathing  Comments: Goes to Adult Day Care     Hand Dominance        Extremity/Trunk Assessment   Upper Extremity Assessment Upper Extremity Assessment: Generalized weakness    Lower Extremity Assessment Lower Extremity  Assessment: Generalized weakness       Communication   Communication: No difficulties  Cognition Arousal/Alertness: Awake/alert Behavior During Therapy: WFL for tasks assessed/performed Overall Cognitive Status: History of cognitive impairments - at baseline                                        General Comments General comments (skin integrity, edema, etc.): Session conducted on Room Air,  and O2 sats remained in the mid 90%s;     03/07/19 1016  Orthostatic Lying   BP- Lying 110/68  Pulse- Lying 52  Orthostatic Sitting  BP- Sitting 96/61  Pulse- Sitting 60  Orthostatic Standing at 0 minutes  BP- Standing at 0 minutes (!) 66/48  Pulse- Standing at 0 minutes 81   MD and RN notified of orthostatic BPs    Exercises     Assessment/Plan    PT Assessment Patient needs continued PT services  PT Problem List Decreased strength;Decreased activity tolerance;Decreased balance;Decreased mobility;Decreased coordination;Decreased cognition;Decreased knowledge of use of DME;Decreased safety awareness;Decreased knowledge of precautions;Cardiopulmonary status limiting activity       PT Treatment Interventions DME instruction;Gait training;Functional mobility training;Therapeutic activities;Therapeutic exercise;Balance training;Neuromuscular re-education;Cognitive remediation;Patient/family education    PT Goals (Current goals can be found in the Care Plan section)  Acute Rehab PT Goals Patient Stated Goal: Agrees to gettingup PT Goal Formulation: Patient unable to participate in goal setting Time For Goal Achievement: 03/21/19 Potential to Achieve Goals: Good    Frequency Min 3X/week   Barriers to discharge        Co-evaluation               AM-PAC PT "6 Clicks" Mobility  Outcome Measure Help needed turning from your back to your side while in a flat bed without using bedrails?: A Little Help needed moving from lying on your back to sitting on the side of a flat bed without using bedrails?: A Little Help needed moving to and from a bed to a chair (including a wheelchair)?: A Little Help needed standing up from a chair using your arms (e.g., wheelchair or bedside chair)?: A Little Help needed to walk in hospital room?: A Lot Help needed climbing 3-5 steps with a railing? : Total 6 Click Score: 15    End of Session Equipment Utilized During Treatment: Gait  belt Activity Tolerance: Patient tolerated treatment well;Other (comment)(except limited by orthostatic hypotension) Patient left: in bed;with call bell/phone within reach;with bed alarm set Nurse Communication: Mobility status;Other (comment)(and orthostatics) PT Visit Diagnosis: Unsteadiness on feet (R26.81);Other abnormalities of gait and mobility (R26.89);Repeated falls (R29.6);Muscle weakness (generalized) (M62.81)    Time: [id number] PT Time Calculation (min) (ACUTE ONLY): 28 min   Charges:   PT Evaluation $PT Eval Moderate Complexity: 1 Mod PT Treatments $Therapeutic Activity: 8-22 mins        Roney Marion, PT  Acute Rehabilitation Services Pager [phone] Office [phone]   [place] 03/07/2019, 2:23 PM

## 2019-03-07 NOTE — Progress Notes (Signed)
Hypoglycemic Event  CBG: 34 @0800  on 03/06/2019  Treatment: D50 50 mL (25 gm)  Symptoms: None  Follow-up CBG: Time:0827 CBG Result:32  Possible Reasons for Event: Inadequate meal intake  Comments/MD notified:Dr Karleen Hampshire was made aware. Order was given to administer D50 50ML patient repeat blood sugar was 33. Another order for a stat serum glucose. The serum glucose at 0933 was 253.    [place], [place]

## 2019-03-07 NOTE — Progress Notes (Signed)
PROGRESS NOTE    Jeffrey Huerta  [id number] DOB: 1945/08/13 DOA: 03/05/2019 PCP: Nolene Ebbs, MD   Brief Narrative:   74 year old gentleman prior history of hypertension, dementia, hydrocephalus with VP shunt presents with hypoxia and altered mental status.  On arrival to ED he was tested positive for Covid and required up to 2 L of nasal cannula oxygen to keep sats greater than 90%.  He was admitted for acute respiratory failure with mild hypoxia secondary to Covid 19 viral infection. Pt became orthostatic while working with PT and he was symptomatic, with dizziness and decreased tolerance to PT. His BP improved on laying down.  He was started on IV fluids plan to repeat orthostatics on discharge.  Assessment & Plan:   Principal Problem:   Acute respiratory disease due to COVID-19 virus Active Problems:   Dementia ([place])   Essential hypertension   Acute respiratory failure with hypoxia secondary to COVID-19 viral infection Patient is currently on room air with good oxygen saturations. Day 3 of remdesivir and on IV Decadron Elevated D-dimer to greater than 20 today, increased Lovenox to 40 mg twice daily. Recommend to continue incentive spirometry. Patient remains afebrile and WBC count within normal limits.  Pro Calcitonin less than 0.1.     Essential hypertension Patient was orthostatic earlier this morning while working with physical therapy, but his pressures were normal on laying down.  Currently not on any blood pressure medications.  Gently hydrate and repeat orthostatics in 1 to 2 days.   Hypoglycemia Unclear etiology possibly from insufficient calorie intake in the setting of COVID-19 infection. Liberalize his diet Continue with Accu-Cheks every 4 hours as needed.  Add dextrose to normal saline if he continues to be hypoglycemic. Patient's hemoglobin A1c is 5.6. In view of his hypoglycemia,  hypotension recommend outpatient work-up for possible Addison's  disease.  Mild normocytic anemia Get anemia panel.  Dementia Without any behavioral abnormalities   Bradycardia Get EKG and review.   DVT prophylaxis: lovenox.  Code Status:  Full code.  Family Communication: discussed with wife over the phone.  Disposition Plan: Home after the completion of remdesivir.   Consultants:   None.   Procedures: none.  Antimicrobials: none.    Subjective: Patient pleasantly confused but denies any chest pain, shortness of breath, nausea, vomiting or abdominal pain.  He denies dizziness headache.  Objective: Vitals:   03/06/19 1754 03/06/19 2052 03/07/19 0738 03/07/19 1021  BP:  112/79 130/71 130/70  Pulse:  62 (!) 50 (!) 49  Resp:   18   Temp: 97.7 F (36.5 C) 98.2 F (36.8 C) 98 F (36.7 C)   TempSrc: Oral Oral    SpO2:  99%    Weight:      Height:        Intake/Output Summary (Last 24 hours) at 03/07/2019 1509 Last data filed at 03/07/2019 [id number] Gross per 24 hour  Intake --  Output 300 ml  Net -300 ml   Filed Weights   03/05/19 0624  Weight: 68 kg    Examination:  General exam: Alert and comfortable and eating his lunch Respiratory system: Clear to auscultation bilaterally, no wheezing or rhonchi Cardiovascular system: S1-S2 heard, bradycardic, no JVD, no pedal edema Gastrointestinal system: Abdomen is soft, nontender, bowel sounds normal Central nervous system: Alert and oriented to person only Extremities: No pedal edema, cyanosis. Skin: No rashes Psychiatry: Mood is appropriate    Data Reviewed: I have personally reviewed following labs and imaging studies  CBC: Recent  Labs  Lab 03/05/19 0602 03/06/19 0638 03/07/19 0600  WBC 7.1 8.1 6.8  NEUTROABS 4.1 4.2 4.1  HGB 11.1* 8.9* 11.0*  HCT 36.8* 29.3* 36.2*  MCV 92.7 90.7 91.0  PLT 199 251 [id number]   Basic Metabolic Panel: Recent Labs  Lab 03/05/19 0602 03/06/19 0638 03/06/19 0933 03/07/19 0751  NA 138 141  --  140  K 4.3 4.8  --  4.5  CL 104 108  --   106  CO2 22 24  --  24  GLUCOSE 89 93 253* 176*  BUN 14 18  --  22  CREATININE 1.20 0.82  --  0.82  CALCIUM 7.8* 8.3*  --  8.0*  MG  --  2.4  --  2.3  PHOS  --  4.0  --  2.9   GFR: Estimated Creatinine Clearance: 73.9 mL/min (by C-G formula based on SCr of 0.82 mg/dL). Liver Function Tests: Recent Labs  Lab 03/05/19 0602 03/06/19 0638 03/07/19 0751  AST 25 21 27   ALT 13 16 19   ALKPHOS 38 36* 38  BILITOT 0.9 0.5 0.6  PROT 6.8 7.0 6.5  ALBUMIN 2.3* 2.3* 2.3*   No results for input(s): LIPASE, AMYLASE in the last 168 hours. No results for input(s): AMMONIA in the last 168 hours. Coagulation Profile: No results for input(s): INR, PROTIME in the last 168 hours. Cardiac Enzymes: No results for input(s): CKTOTAL, CKMB, CKMBINDEX, TROPONINI in the last 168 hours. BNP (last 3 results) No results for input(s): PROBNP in the last 8760 hours. HbA1C: Recent Labs    03/07/19 0600  HGBA1C 5.6   CBG: Recent Labs  Lab 03/06/19 1739 03/07/19 0215 03/07/19 0723 03/07/19 0809 03/07/19 1157  GLUCAP 91 109* 37* 146* 96   Lipid Profile: Recent Labs    03/05/19 0602  TRIG 95   Thyroid Function Tests: No results for input(s): TSH, T4TOTAL, FREET4, T3FREE, THYROIDAB in the last 72 hours. Anemia Panel: Recent Labs    03/06/19 0638 03/07/19 0751  FERRITIN 465* 635*   Sepsis Labs: Recent Labs  Lab 03/05/19 0602 03/05/19 0612  PROCALCITON <0.10  --   LATICACIDVEN  --  1.5    Recent Results (from the past 240 hour(s))  Blood Culture (routine x 2)     Status: None (Preliminary result)   Collection Time: 03/05/19  7:56 AM   Specimen: BLOOD RIGHT HAND  Result Value Ref Range Status   Specimen Description BLOOD RIGHT HAND  Final   Special Requests   Final    BOTTLES DRAWN AEROBIC AND ANAEROBIC Blood Culture adequate volume   Culture   Final    NO GROWTH 2 DAYS Performed at [facility], [address], [place], [place] [address]    Report Status PENDING   Incomplete  Blood Culture (routine x 2)     Status: None (Preliminary result)   Collection Time: 03/05/19  8:00 AM   Specimen: BLOOD  Result Value Ref Range Status   Specimen Description BLOOD RIGHT ANTECUBITAL  Final   Special Requests   Final    BOTTLES DRAWN AEROBIC AND ANAEROBIC Blood Culture adequate volume   Culture   Final    NO GROWTH 2 DAYS Performed at [facility], [place] [address], [place], [place] [address]    Report Status PENDING  Incomplete         Radiology Studies: No results found.      Scheduled Meds: . dexamethasone (DECADRON) injection  6 mg Intravenous  Q24H  . enoxaparin (LOVENOX) injection  40 mg Subcutaneous Q12H  . feeding supplement (ENSURE ENLIVE)  237 mL Oral TID BM  . sodium chloride flush  3 mL Intravenous Q12H   Continuous Infusions: . sodium chloride    . sodium chloride 75 mL/hr at 03/07/19 1058  . remdesivir 100 mg in NS 100 mL 100 mg (03/07/19 1103)     LOS: 2 days        Hosie Poisson, MD [facility] 03/07/2019, 3:09 PM

## 2019-03-08 DIAGNOSIS — E162 Hypoglycemia, unspecified: Secondary | ICD-10-CM

## 2019-03-08 LAB — COMPREHENSIVE METABOLIC PANEL
ALT: 29 U/L (ref 0–44)
AST: 37 U/L (ref 15–41)
Albumin: 2 g/dL — ABNORMAL LOW (ref 3.5–5.0)
Alkaline Phosphatase: 33 U/L — ABNORMAL LOW (ref 38–126)
Anion gap: 6 (ref 5–15)
BUN: 18 mg/dL (ref 8–23)
CO2: 24 mmol/L (ref 22–32)
Calcium: 7.7 mg/dL — ABNORMAL LOW (ref 8.9–10.3)
Chloride: 110 mmol/L (ref 98–111)
Creatinine, Ser: 0.83 mg/dL (ref 0.61–1.24)
GFR calc Af Amer: >60 mL/min (ref >60)
GFR calc non Af Amer: >60 mL/min (ref >60)
Glucose, Bld: 106 mg/dL — ABNORMAL HIGH (ref 70–99)
Potassium: 4.8 mmol/L (ref 3.5–5.1)
Sodium: 140 mmol/L (ref 135–145)
Total Bilirubin: 0.4 mg/dL (ref 0.3–1.2)
Total Protein: 5.8 g/dL — ABNORMAL LOW (ref 6.5–8.1)

## 2019-03-08 LAB — CBC WITH DIFFERENTIAL/PLATELET
Abs Immature Granulocytes: 0.06 10*3/uL (ref 0.00–0.07)
Basophils Absolute: 0 10*3/uL (ref 0.0–0.1)
Basophils Relative: 0 %
Eosinophils Absolute: 0 10*3/uL (ref 0.0–0.5)
Eosinophils Relative: 0 %
HCT: 34.5 % — ABNORMAL LOW (ref 39.0–52.0)
Hemoglobin: 10.6 g/dL — ABNORMAL LOW (ref 13.0–17.0)
Immature Granulocytes: 1 %
Lymphocytes Relative: 38 %
Lymphs Abs: 3.2 10*3/uL (ref 0.7–4.0)
MCH: 27.9 pg (ref 26.0–34.0)
MCHC: 30.7 g/dL (ref 30.0–36.0)
MCV: 90.8 fL (ref 80.0–100.0)
Monocytes Absolute: 0.5 10*3/uL (ref 0.1–1.0)
Monocytes Relative: 6 %
Neutro Abs: 4.8 10*3/uL (ref 1.7–7.7)
Neutrophils Relative %: 55 %
Platelets: 248 10*3/uL (ref 150–400)
RBC: 3.8 MIL/uL — ABNORMAL LOW (ref 4.22–5.81)
RDW: 13.7 % (ref 11.5–15.5)
WBC: 8.6 10*3/uL (ref 4.0–10.5)
nRBC: 0 % (ref 0.0–0.2)

## 2019-03-08 LAB — D-DIMER, QUANTITATIVE: D-Dimer, Quant: 1.04 ug/mL-FEU — ABNORMAL HIGH (ref 0.00–0.50)

## 2019-03-08 LAB — GLUCOSE, CAPILLARY
Glucose-Capillary: 119 mg/dL — ABNORMAL HIGH (ref 70–99)
Glucose-Capillary: 13 mg/dL — CL (ref 70–99)
Glucose-Capillary: 137 mg/dL — ABNORMAL HIGH (ref 70–99)
Glucose-Capillary: 144 mg/dL — ABNORMAL HIGH (ref 70–99)
Glucose-Capillary: 17 mg/dL — CL (ref 70–99)
Glucose-Capillary: 17 mg/dL — CL (ref 70–99)
Glucose-Capillary: 27 mg/dL — CL (ref 70–99)
Glucose-Capillary: 72 mg/dL (ref 70–99)
Glucose-Capillary: 73 mg/dL (ref 70–99)
Glucose-Capillary: 95 mg/dL (ref 70–99)

## 2019-03-08 LAB — BASIC METABOLIC PANEL
Anion gap: 10 (ref 5–15)
BUN: 15 mg/dL (ref 8–23)
CO2: 23 mmol/L (ref 22–32)
Calcium: 8 mg/dL — ABNORMAL LOW (ref 8.9–10.3)
Chloride: 106 mmol/L (ref 98–111)
Creatinine, Ser: 0.91 mg/dL (ref 0.61–1.24)
GFR calc Af Amer: >60 mL/min (ref >60)
GFR calc non Af Amer: >60 mL/min (ref >60)
Glucose, Bld: 122 mg/dL — ABNORMAL HIGH (ref 70–99)
Potassium: 4.5 mmol/L (ref 3.5–5.1)
Sodium: 139 mmol/L (ref 135–145)

## 2019-03-08 LAB — PHOSPHORUS: Phosphorus: 2.6 mg/dL (ref 2.5–4.6)

## 2019-03-08 LAB — C-REACTIVE PROTEIN: CRP: 4.3 mg/dL — ABNORMAL HIGH (ref <1.0)

## 2019-03-08 LAB — FERRITIN: Ferritin: 466 ng/mL — ABNORMAL HIGH (ref 24–336)

## 2019-03-08 LAB — MAGNESIUM: Magnesium: 2.1 mg/dL (ref 1.7–2.4)

## 2019-03-08 MED ORDER — ENOXAPARIN SODIUM 80 MG/0.8ML ~~LOC~~ SOLN: 80.0000 mg | SUBCUTANEOUS | Status: DC

## 2019-03-08 MED ORDER — ENOXAPARIN SODIUM 40 MG/0.4ML ~~LOC~~ SOLN
40.0000 mg | SUBCUTANEOUS | Status: DC
  Administered 2019-03-08: 40 mg via SUBCUTANEOUS
  Filled 2019-03-08: qty 0.4

## 2019-03-08 MED ORDER — DEXTROSE-NACL 5-0.9 % IV SOLN: INTRAVENOUS | Status: DC

## 2019-03-08 NOTE — Progress Notes (Signed)
PROGRESS NOTE    Jeffrey Huerta  [id number] DOB: Jul 03, 1945 DOA: 03/05/2019 PCP: Nolene Ebbs, MD   Brief Narrative:   74 year old gentleman prior history of hypertension, dementia, hydrocephalus with VP shunt presents with hypoxia and altered mental status.  On arrival to ED he was tested positive for Covid and required up to 2 L of nasal cannula oxygen to keep sats greater than 90%.  He was admitted for acute respiratory failure with mild hypoxia secondary to Covid 19 viral infection. Pt became orthostatic while working with PT and he was symptomatic, with dizziness and decreased tolerance to PT. His BP improved on laying down. He continues to be orthostatic despite IV lfuids, will order compression stockings. Suspect he has a component of autonomic dysfunction.   Assessment & Plan:   Principal Problem:   Acute respiratory disease due to COVID-19 virus Active Problems:   Dementia ([place])   Essential hypertension   Acute respiratory failure with hypoxia secondary to COVID-19 viral infection Patient is currently on room air with good oxygen saturations. Day 4 of remdesivir and on IV Decadron Recommend to continue incentive spirometry,  Patient remains afebrile and WBC count within normal limits..  Pro Calcitonin less than 0.1.     Essential hypertension Blood pressure parameters while laying down within normal limits.  Repeat orthostatics were positive but patient was not symptomatic this time. Recommend to continue IV fluids and repeat orthostatics on discharge.   Hypoglycemia Unclear etiology possibly from insufficient calorie intake in the setting of COVID-19 infection. Liberalize his diet Continue with Accu-Cheks every 4 hours as needed.  Started the patient on dextrose normal saline fluids. Patient's hemoglobin A1c is 5.6. In view of his hypoglycemia,  hypotension recommend outpatient work-up for possible Addison's disease.  Mild normocytic anemia Anemia panel shows low  iron levels.  Hemoglobin stable around 10. Iron supplementation will be added.  Dementia Without any behavioral abnormalities   Bradycardia EKG shows sinus bradycardia.  DVT prophylaxis: lovenox.  Code Status:  Full code.  Family Communication: discussed with wife over the phone.  Disposition Plan: Home after the completion of remdesivir.   Consultants:   None.   Procedures: none.  Antimicrobials: none.    Subjective: Patient is pleasantly confused and denies any new complaints at this time.  Objective: Vitals:   03/07/19 1957 03/08/19 0728 03/08/19 1425 03/08/19 1545  BP: 120/63 122/76 110/70 121/75  Pulse: (!) 50 (!) 58 64   Resp: 20     Temp: 98 F (36.7 C) 98.9 F (37.2 C) 98.7 F (37.1 C) 99.1 F (37.3 C)  TempSrc:  Oral Oral Oral  SpO2: 99% 98% 100% 100%  Weight:      Height:        Intake/Output Summary (Last 24 hours) at 03/08/2019 1739 Last data filed at 03/08/2019 1700 Gross per 24 hour  Intake 150 ml  Output 1200 ml  Net -1050 ml   Filed Weights   03/05/19 0624  Weight: 68 kg    Examination:  General exam: Alert and comfortable, not in any kind of distress. Respiratory system: Clear to auscultation bilaterally, no wheezing or rhonchi Cardiovascular system: S1-S2 heard, regular rate rhythm, no JVD, no pedal edema Gastrointestinal system: Abdomen is soft, nontender, nondistended, bowel sounds normal Central nervous system: Alert and oriented to person only. Extremities: No pedal edema, or cyanosis. Skin: No rashes Psychiatry: Mood is appropriate    Data Reviewed: I have personally reviewed following labs and imaging studies  CBC: Recent Labs  Lab 03/05/19 0602 03/06/19 0638 03/07/19 0600 03/08/19 0252  WBC 7.1 8.1 6.8 8.6  NEUTROABS 4.1 4.2 4.1 4.8  HGB 11.1* 8.9* 11.0* 10.6*  HCT 36.8* 29.3* 36.2* 34.5*  MCV 92.7 90.7 91.0 90.8  PLT 199 251 214 [id number]   Basic Metabolic Panel: Recent Labs  Lab 03/05/19 0602 03/05/19 0602  03/06/19 [id number] 03/06/19 0933 03/07/19 0751 03/08/19 0252 03/08/19 1242  NA 138  --  141  --  140 140 139  K 4.3  --  4.8  --  4.5 4.8 4.5  CL 104  --  108  --  106 110 106  CO2 22  --  24  --  24 24 23   GLUCOSE 89   < > 93 253* 176* 106* 122*  BUN 14  --  18  --  22 18 15   CREATININE 1.20  --  0.82  --  0.82 0.83 0.91  CALCIUM 7.8*  --  8.3*  --  8.0* 7.7* 8.0*  MG  --   --  2.4  --  2.3 2.1  --   PHOS  --   --  4.0  --  2.9 2.6  --    < > = values in this interval not displayed.   GFR: Estimated Creatinine Clearance: 66.6 mL/min (by C-G formula based on SCr of 0.91 mg/dL). Liver Function Tests: Recent Labs  Lab 03/05/19 0602 03/06/19 0638 03/07/19 0751 03/08/19 0252  AST 25 21 27  37  ALT 13 16 19 29   ALKPHOS 38 36* 38 33*  BILITOT 0.9 0.5 0.6 0.4  PROT 6.8 7.0 6.5 5.8*  ALBUMIN 2.3* 2.3* 2.3* 2.0*   No results for input(s): LIPASE, AMYLASE in the last 168 hours. No results for input(s): AMMONIA in the last 168 hours. Coagulation Profile: No results for input(s): INR, PROTIME in the last 168 hours. Cardiac Enzymes: No results for input(s): CKTOTAL, CKMB, CKMBINDEX, TROPONINI in the last 168 hours. BNP (last 3 results) No results for input(s): PROBNP in the last 8760 hours. HbA1C: Recent Labs    03/07/19 0600  HGBA1C 5.6   CBG: Recent Labs  Lab 03/08/19 1149 03/08/19 1155 03/08/19 1203 03/08/19 1204 03/08/19 1423  GLUCAP 17* 27* 13* 17* 137*   Lipid Profile: No results for input(s): CHOL, HDL, LDLCALC, TRIG, CHOLHDL, LDLDIRECT in the last 72 hours. Thyroid Function Tests: No results for input(s): TSH, T4TOTAL, FREET4, T3FREE, THYROIDAB in the last 72 hours. Anemia Panel: Recent Labs    03/07/19 0751 03/07/19 1554 03/08/19 0252  VITAMINB12  --  573  --   FOLATE  --  12.7  --   FERRITIN 635*  --  466*  TIBC  --  199*  --   IRON  --  28*  --   RETICCTPCT  --  0.8  --    Sepsis Labs: Recent Labs  Lab 03/05/19 0602 03/05/19 0612  PROCALCITON  <0.10  --   LATICACIDVEN  --  1.5    Recent Results (from the past 240 hour(s))  Blood Culture (routine x 2)     Status: None (Preliminary result)   Collection Time: 03/05/19  7:56 AM   Specimen: BLOOD RIGHT HAND  Result Value Ref Range Status   Specimen Description BLOOD RIGHT HAND  Final   Special Requests   Final    BOTTLES DRAWN AEROBIC AND ANAEROBIC Blood Culture adequate volume   Culture   Final    NO GROWTH 3 DAYS Performed at [facility]  Hospital Lab, [place] [address], [place], [place] [address]    Report Status PENDING  Incomplete  Blood Culture (routine x 2)     Status: None (Preliminary result)   Collection Time: 03/05/19  8:00 AM   Specimen: BLOOD  Result Value Ref Range Status   Specimen Description BLOOD RIGHT ANTECUBITAL  Final   Special Requests   Final    BOTTLES DRAWN AEROBIC AND ANAEROBIC Blood Culture adequate volume   Culture   Final    NO GROWTH 3 DAYS Performed at [facility], [place] [address], [place], [place] [address]    Report Status PENDING  Incomplete         Radiology Studies: No results found.      Scheduled Meds: . dexamethasone (DECADRON) injection  6 mg Intravenous Q24H  . enoxaparin (LOVENOX) injection  40 mg Subcutaneous Q24H  . feeding supplement (ENSURE ENLIVE)  237 mL Oral TID BM  . sodium chloride flush  3 mL Intravenous Q12H   Continuous Infusions: . sodium chloride    . dextrose 5 % and 0.9% NaCl 75 mL/hr at 03/08/19 1000  . remdesivir 100 mg in NS 100 mL 100 mg (03/08/19 1022)     LOS: 3 days        Hosie Poisson, MD [facility] 03/08/2019, 5:39 PM

## 2019-03-08 NOTE — Evaluation (Signed)
Occupational Therapy Evaluation Patient Details Name: Jeffrey Huerta MRN: [id number] DOB: June 06, 1945 Today's Date: 03/08/2019    History of Present Illness 74 year old gentleman prior history of hypertension, dementia, hydrocephalus with VP shunt presents with hypoxia and altered mental status.  On arrival to ED he was tested positive for Covid and required up to 2 L of nasal cannula oxygen to keep sats greater than 90%.  He was admitted for acute respiratory failure with mild hypoxia secondary to Covid 19 viral infection.   Clinical Impression   Pt admitted with above. He demonstrates the below listed deficits and will benefit from continued OT to maximize safety and independence with BADLs.  Pt presents to OT with generalized weakness and impaired cognition.   He currently requires min A for ADLs and functional mobility - he is mildly unsteady.  Anticipate he is getting close to his baseline.  Will follow acutely.       Follow Up Recommendations  No OT follow up;Supervision/Assistance - 24 hour    Equipment Recommendations  Tub/shower seat    Recommendations for Other Services       Precautions / Restrictions Precautions Precautions: Fall Precaution Comments: Orthostatic on eval      Mobility Bed Mobility Overal bed mobility: Needs Assistance Bed Mobility: Supine to Sit     Supine to sit: Supervision        Transfers Overall transfer level: Needs assistance Equipment used: 1 person hand held assist Transfers: Sit to/from Stand;Stand Pivot Transfers Sit to Stand: Min guard Stand pivot transfers: Min assist       General transfer comment: assist due to mild unsteadiness     Balance Overall balance assessment: Needs assistance Sitting-balance support: Feet supported Sitting balance-Leahy Scale: Fair       Standing balance-Leahy Scale: Fair Standing balance comment: able to maintain static standing with close min guard assist with no UE support                             ADL either performed or assessed with clinical judgement   ADL Overall ADL's : Needs assistance/impaired Eating/Feeding: Independent   Grooming: Wash/dry hands;Wash/dry face;Oral care;Brushing hair;Min guard;Standing   Upper Body Bathing: Minimal assistance;Sitting   Lower Body Bathing: Minimal assistance;Sit to/from stand   Upper Body Dressing : Minimal assistance;Sitting   Lower Body Dressing: Minimal assistance;Sit to/from stand   Toilet Transfer: Minimal assistance;Ambulation;Comfort height toilet Toilet Transfer Details (indicate cue type and reason): pushed IV pole  Toileting- Clothing Manipulation and Hygiene: Minimal assistance;Sit to/from stand       Functional mobility during ADLs: Min guard;Minimal assistance General ADL Comments: Pt mildly undteady - requires assist for balance      Vision Baseline Vision/History: Wears glasses Wears Glasses: At all times Patient Visual Report: No change from baseline       Perception     Praxis      Pertinent Vitals/Pain Pain Assessment: No/denies pain     Hand Dominance Right   Extremity/Trunk Assessment Upper Extremity Assessment Upper Extremity Assessment: Generalized weakness   Lower Extremity Assessment Lower Extremity Assessment: Generalized weakness   Cervical / Trunk Assessment Cervical / Trunk Assessment: Normal   Communication Communication Communication: No difficulties   Cognition Arousal/Alertness: Awake/alert Behavior During Therapy: WFL for tasks assessed/performed Overall Cognitive Status: No family/caregiver present to determine baseline cognitive functioning  General Comments: Pt with h/o dementia.  No family present to cofirm if pt is at baseline.  Pt is oriented to self and DOB, the hospital and [facility].  He follows commands consistently    General Comments  spoke with RN prior to entry.  RN reports CBG very low with  last finger stick, but that he has had D-50, and two ensures.   Per RN, MD reports that lab drawn blood glucose has not been matching finger stick and pt currently is not symptomatic of low BS, and therefore, he felt that pt was okay to work with OT     Exercises     Shoulder Instructions      Home Living Family/patient expects to be discharged to:: Private residence Living Arrangements: Spouse/significant other Available Help at Discharge: Family Type of Home: House Home Access: Stairs to enter Technical brewer of Steps: 1 Entrance Stairs-Rails: None Home Layout: One level                   Additional Comments: "wobbly" per wife, but no falls until this recent Covid dx      Prior Functioning/Environment Level of Independence: Needs assistance  Gait / Transfers Assistance Needed: walks without AD, "wobbly" per wife, but no falls until this recent Covid dx ADL's / Homemaking Assistance Needed: Assist in the bathroom with bathing   Comments: Goes to Adult Day Care        OT Problem List: Decreased strength;Decreased activity tolerance;Impaired balance (sitting and/or standing);Decreased cognition;Decreased safety awareness      OT Treatment/Interventions: Self-care/ADL training;DME and/or AE instruction;Therapeutic activities;Cognitive remediation/compensation;Patient/family education;Balance training    OT Goals(Current goals can be found in the care plan section) Acute Rehab OT Goals OT Goal Formulation: Patient unable to participate in goal setting Time For Goal Achievement: 03/22/19 Potential to Achieve Goals: Good ADL Goals Pt Will Perform Grooming: with min guard assist;standing Pt Will Perform Upper Body Bathing: with supervision;sitting Pt Will Perform Lower Body Bathing: with min guard assist;sit to/from stand Pt Will Perform Upper Body Dressing: with supervision;sitting Pt Will Perform Lower Body Dressing: with min guard assist;sit to/from stand Pt  Will Transfer to Toilet: with min guard assist;ambulating;regular height toilet;bedside commode;grab bars Pt Will Perform Toileting - Clothing Manipulation and hygiene: with min guard assist;sit to/from stand  OT Frequency: Min 2X/week   Barriers to D/C:            Co-evaluation              AM-PAC OT "6 Clicks" Daily Activity     Outcome Measure Help from another person eating meals?: None Help from another person taking care of personal grooming?: A Little Help from another person toileting, which includes using toliet, bedpan, or urinal?: A Little Help from another person bathing (including washing, rinsing, drying)?: A Little Help from another person to put on and taking off regular upper body clothing?: A Little Help from another person to put on and taking off regular lower body clothing?: A Little 6 Click Score: 19   End of Session Equipment Utilized During Treatment: Gait belt Nurse Communication: Mobility status  Activity Tolerance: Patient tolerated treatment well Patient left: in chair;with call bell/phone within reach;with chair alarm set  OT Visit Diagnosis: Unsteadiness on feet (R26.81);Cognitive communication deficit (R41.841)                Time: [id number] OT Time Calculation (min): 21 min Charges:  OT General Charges $OT Visit: 1 Visit OT Evaluation $  OT Eval Moderate Complexity: 1 Mod  Nilsa Nutting., OTR/L Acute Rehabilitation Services Pager [phone] Office [phone]   Lucille Passy M 03/08/2019, 1:14 PM

## 2019-03-09 LAB — CBC WITH DIFFERENTIAL/PLATELET
Abs Immature Granulocytes: 0.09 10*3/uL — ABNORMAL HIGH (ref 0.00–0.07)
Basophils Absolute: 0 10*3/uL (ref 0.0–0.1)
Basophils Relative: 0 %
Eosinophils Absolute: 0 10*3/uL (ref 0.0–0.5)
Eosinophils Relative: 0 %
HCT: 34.8 % — ABNORMAL LOW (ref 39.0–52.0)
Hemoglobin: 10.7 g/dL — ABNORMAL LOW (ref 13.0–17.0)
Immature Granulocytes: 1 %
Lymphocytes Relative: 44 %
Lymphs Abs: 3.8 10*3/uL (ref 0.7–4.0)
MCH: 27.6 pg (ref 26.0–34.0)
MCHC: 30.7 g/dL (ref 30.0–36.0)
MCV: 89.9 fL (ref 80.0–100.0)
Monocytes Absolute: 0.5 10*3/uL (ref 0.1–1.0)
Monocytes Relative: 6 %
Neutro Abs: 4.4 10*3/uL (ref 1.7–7.7)
Neutrophils Relative %: 49 %
Platelets: 193 10*3/uL (ref 150–400)
RBC: 3.87 MIL/uL — ABNORMAL LOW (ref 4.22–5.81)
RDW: 13.7 % (ref 11.5–15.5)
WBC: 8.8 10*3/uL (ref 4.0–10.5)
nRBC: 0 % (ref 0.0–0.2)

## 2019-03-09 LAB — MAGNESIUM: Magnesium: 2.1 mg/dL (ref 1.7–2.4)

## 2019-03-09 LAB — GLUCOSE, CAPILLARY
Glucose-Capillary: 108 mg/dL — ABNORMAL HIGH (ref 70–99)
Glucose-Capillary: 86 mg/dL (ref 70–99)
Glucose-Capillary: 22 mg/dL — CL (ref 70–99)
Glucose-Capillary: 166 mg/dL — ABNORMAL HIGH (ref 70–99)
Glucose-Capillary: 138 mg/dL — ABNORMAL HIGH (ref 70–99)

## 2019-03-09 LAB — COMPREHENSIVE METABOLIC PANEL
ALT: 28 U/L (ref 0–44)
AST: 26 U/L (ref 15–41)
Albumin: 1.9 g/dL — ABNORMAL LOW (ref 3.5–5.0)
Alkaline Phosphatase: 35 U/L — ABNORMAL LOW (ref 38–126)
Anion gap: 6 (ref 5–15)
BUN: 15 mg/dL (ref 8–23)
CO2: 25 mmol/L (ref 22–32)
Calcium: 7.7 mg/dL — ABNORMAL LOW (ref 8.9–10.3)
Chloride: 109 mmol/L (ref 98–111)
Creatinine, Ser: 0.87 mg/dL (ref 0.61–1.24)
GFR calc Af Amer: >60 mL/min (ref >60)
GFR calc non Af Amer: >60 mL/min (ref >60)
Glucose, Bld: 115 mg/dL — ABNORMAL HIGH (ref 70–99)
Potassium: 4.9 mmol/L (ref 3.5–5.1)
Sodium: 140 mmol/L (ref 135–145)
Total Bilirubin: 0.4 mg/dL (ref 0.3–1.2)
Total Protein: 6 g/dL — ABNORMAL LOW (ref 6.5–8.1)

## 2019-03-09 LAB — D-DIMER, QUANTITATIVE: D-Dimer, Quant: 0.96 ug/mL-FEU — ABNORMAL HIGH (ref 0.00–0.50)

## 2019-03-09 LAB — PATHOLOGIST SMEAR REVIEW: Path Review: REACTIVE

## 2019-03-09 LAB — BETA-HYDROXYBUTYRIC ACID: Beta-Hydroxybutyric Acid: 0.11 mmol/L (ref 0.05–0.27)

## 2019-03-09 LAB — FERRITIN: Ferritin: 349 ng/mL — ABNORMAL HIGH (ref 24–336)

## 2019-03-09 LAB — PHOSPHORUS: Phosphorus: 3.1 mg/dL (ref 2.5–4.6)

## 2019-03-09 LAB — C-REACTIVE PROTEIN: CRP: 4.9 mg/dL — ABNORMAL HIGH (ref <1.0)

## 2019-03-09 MED ORDER — DEXAMETHASONE 6 MG PO TABS: 6.0000 mg | ORAL_TABLET | Freq: Every day | ORAL | 0 refills | Status: DC

## 2019-03-09 MED ORDER — ENSURE ENLIVE PO LIQD: 237.0000 mL | Freq: Three times a day (TID) | ORAL | 12 refills | Status: DC

## 2019-03-09 MED ORDER — ALBUTEROL SULFATE HFA 108 (90 BASE) MCG/ACT IN AERS: 2.0000 | INHALATION_SPRAY | RESPIRATORY_TRACT | 0 refills | Status: DC | PRN

## 2019-03-09 MED ORDER — GUAIFENESIN-DM 100-10 MG/5ML PO SYRP: 10.0000 mL | ORAL_SOLUTION | ORAL | 0 refills | Status: DC | PRN

## 2019-03-09 NOTE — Progress Notes (Addendum)
   Vital Signs MEWS/VS Documentation      03/08/2019 1933 03/08/2019 1944 03/08/2019 2009 03/09/2019 0116   MEWS Score:  0  0  0  2   MEWS Score Color:  Green  Green  Green  Yellow   Resp:  --  --  20  --   Pulse:  --  --  (!) 54  --   BP:  --  --  116/78  --   Temp:  --  --  97.8 F (36.6 C)  --   O2 Device:  --  --  Room Air  --   Level of Consciousness:  Alert  --  --  --       HR is not a new finding for this patient. Assessed patient, he is asymptomatic and resting comfortably. Will continue to monitor patient.     Hiral Lukasiewicz 03/09/2019,8:07 AM

## 2019-03-09 NOTE — Progress Notes (Signed)
Physical Therapy Treatment Patient Details Name: Jeffrey Huerta MRN: [id number] DOB: 04/01/1945 Today's Date: 03/09/2019    History of Present Illness 74 year old gentleman prior history of hypertension, dementia, hydrocephalus with VP shunt presents with hypoxia and altered mental status.  On arrival to ED he was tested positive for Covid and required up to 2 L of nasal cannula oxygen to keep sats greater than 90%.  He was admitted for acute respiratory failure with mild hypoxia secondary to Covid 19 viral infection.    PT Comments    Pt with no dizziness today and stable BPs (see orthostatics below). Pt able to transfer and ambulate in room with min guard assist.  Acute PT to cont to follow.  BP with HOB at 30 deg (upon PT arrival)  114/67 Bp upon sitting EOB: 124/78 BP in standing 133/78 BP s/p ambulation 118/65   Follow Up Recommendations  Home health PT;Supervision/Assistance - 24 hour     Equipment Recommendations  Rolling walker with 5" wheels;3in1 (PT);Other (comment)    Recommendations for Other Services       Precautions / Restrictions Precautions Precautions: Fall Precaution Comments: h/o of severe orthostatic BP however wasnt today Restrictions Weight Bearing Restrictions: No    Mobility  Bed Mobility Overal bed mobility: Needs Assistance Bed Mobility: Supine to Sit     Supine to sit: Supervision     General bed mobility comments: no physical assist needed  Transfers Overall transfer level: Needs assistance Equipment used: 1 person hand held assist Transfers: Sit to/from Stand;Stand Pivot Transfers Sit to Stand: Min guard         General transfer comment: pt steady with no report of dizzienss  Ambulation/Gait Ambulation/Gait assistance: Min guard Gait Distance (Feet): 30 Feet Assistive device: None Gait Pattern/deviations: Step-through pattern;Decreased stride length Gait velocity: dec Gait velocity interpretation: 1.31 - 2.62 ft/sec, indicative  of limited community ambulator General Gait Details: pt steady with short step length but no episode of LOB, pt denies dizziness, BP stable   Stairs             Wheelchair Mobility    Modified Rankin (Stroke Patients Only)       Balance Overall balance assessment: Mild deficits observed, not formally tested                                          Cognition Arousal/Alertness: Awake/alert Behavior During Therapy: WFL for tasks assessed/performed Overall Cognitive Status: No family/caregiver present to determine baseline cognitive functioning                                 General Comments: Pt with h/o dementia.  No family present to cofirm if pt is at baseline.  Pt is oriented to self and DOB, the hospital and [facility].  He follows commands consistently       Exercises      General Comments General comments (skin integrity, edema, etc.): VSS      Pertinent Vitals/Pain Pain Assessment: No/denies pain    Home Living                      Prior Function            PT Goals (current goals can now be found in the care plan section) Progress towards PT goals: Progressing  toward goals    Frequency    Min 3X/week      PT Plan Current plan remains appropriate    Co-evaluation              AM-PAC PT "6 Clicks" Mobility   Outcome Measure  Help needed turning from your back to your side while in a flat bed without using bedrails?: A Little Help needed moving from lying on your back to sitting on the side of a flat bed without using bedrails?: A Little Help needed moving to and from a bed to a chair (including a wheelchair)?: A Little Help needed standing up from a chair using your arms (e.g., wheelchair or bedside chair)?: A Little Help needed to walk in hospital room?: A Little Help needed climbing 3-5 steps with a railing? : A Little 6 Click Score: 18    End of Session Equipment Utilized During Treatment:  Gait belt Activity Tolerance: Patient tolerated treatment well;Other (comment) Patient left: in bed;with call bell/phone within reach;with chair alarm set Nurse Communication: Mobility status;Other (comment) PT Visit Diagnosis: Unsteadiness on feet (R26.81);Other abnormalities of gait and mobility (R26.89);Repeated falls (R29.6);Muscle weakness (generalized) (M62.81)     Time: [id number] PT Time Calculation (min) (ACUTE ONLY): 24 min  Charges:  $Gait Training: 8-22 mins $Therapeutic Activity: 8-22 mins                     Kittie Plater, PT, DPT Acute Rehabilitation Services Pager #: [phone] Office #: [phone]    Berline Lopes 03/09/2019, 2:25 PM

## 2019-03-10 ENCOUNTER — Emergency Department (HOSPITAL_COMMUNITY): Payer: Medicare Other

## 2019-03-10 ENCOUNTER — Emergency Department (HOSPITAL_COMMUNITY)
Admission: EM | Admit: 2019-03-10 | Discharge: 2019-03-10 | Disposition: A | Discharge status: Home / Self Care | Payer: Medicare Other | Attending: Emergency Medicine | Admitting: Emergency Medicine

## 2019-03-10 ENCOUNTER — Other Ambulatory Visit: Payer: Self-pay

## 2019-03-10 ENCOUNTER — Encounter (HOSPITAL_COMMUNITY): Payer: Self-pay

## 2019-03-10 DIAGNOSIS — N3000 Acute cystitis without hematuria: Secondary | ICD-10-CM | POA: Insufficient documentation

## 2019-03-10 DIAGNOSIS — I951 Orthostatic hypotension: Secondary | ICD-10-CM | POA: Insufficient documentation

## 2019-03-10 DIAGNOSIS — F0390 Unspecified dementia without behavioral disturbance: Secondary | ICD-10-CM | POA: Diagnosis not present

## 2019-03-10 DIAGNOSIS — I10 Essential (primary) hypertension: Secondary | ICD-10-CM | POA: Insufficient documentation

## 2019-03-10 DIAGNOSIS — U071 COVID-19: Secondary | ICD-10-CM | POA: Insufficient documentation

## 2019-03-10 DIAGNOSIS — J189 Pneumonia, unspecified organism: Secondary | ICD-10-CM | POA: Diagnosis not present

## 2019-03-10 DIAGNOSIS — R55 Syncope and collapse: Secondary | ICD-10-CM | POA: Diagnosis present

## 2019-03-10 DIAGNOSIS — Z79899 Other long term (current) drug therapy: Secondary | ICD-10-CM | POA: Insufficient documentation

## 2019-03-10 LAB — URINALYSIS, ROUTINE W REFLEX MICROSCOPIC
Bilirubin Urine: NEGATIVE
Glucose, UA: NEGATIVE mg/dL
Ketones, ur: NEGATIVE mg/dL
Nitrite: POSITIVE — AB
Protein, ur: NEGATIVE mg/dL
RBC / HPF: >50 RBC/hpf — ABNORMAL HIGH (ref 0–5)
Specific Gravity, Urine: 1.011 (ref 1.005–1.030)
WBC, UA: >50 WBC/hpf — ABNORMAL HIGH (ref 0–5)
pH: 7 (ref 5.0–8.0)

## 2019-03-10 LAB — CULTURE, BLOOD (ROUTINE X 2)
Culture: NO GROWTH
Culture: NO GROWTH
Special Requests: ADEQUATE
Special Requests: ADEQUATE

## 2019-03-10 LAB — COMPREHENSIVE METABOLIC PANEL
ALT: 27 U/L (ref 0–44)
AST: 27 U/L (ref 15–41)
Albumin: 2.1 g/dL — ABNORMAL LOW (ref 3.5–5.0)
Alkaline Phosphatase: 44 U/L (ref 38–126)
Anion gap: 8 (ref 5–15)
BUN: 15 mg/dL (ref 8–23)
CO2: 24 mmol/L (ref 22–32)
Calcium: 7.9 mg/dL — ABNORMAL LOW (ref 8.9–10.3)
Chloride: 102 mmol/L (ref 98–111)
Creatinine, Ser: 0.93 mg/dL (ref 0.61–1.24)
GFR calc Af Amer: >60 mL/min (ref >60)
GFR calc non Af Amer: >60 mL/min (ref >60)
Glucose, Bld: 130 mg/dL — ABNORMAL HIGH (ref 70–99)
Potassium: 4.8 mmol/L (ref 3.5–5.1)
Sodium: 134 mmol/L — ABNORMAL LOW (ref 135–145)
Total Bilirubin: 0.5 mg/dL (ref 0.3–1.2)
Total Protein: 6.4 g/dL — ABNORMAL LOW (ref 6.5–8.1)

## 2019-03-10 LAB — INSULIN, RANDOM: Insulin: 30 u[IU]/mL — ABNORMAL HIGH (ref 2.6–24.9)

## 2019-03-10 LAB — CBC WITH DIFFERENTIAL/PLATELET
Abs Immature Granulocytes: 0.16 10*3/uL — ABNORMAL HIGH (ref 0.00–0.07)
Basophils Absolute: 0 10*3/uL (ref 0.0–0.1)
Basophils Relative: 0 %
Eosinophils Absolute: 0 10*3/uL (ref 0.0–0.5)
Eosinophils Relative: 0 %
HCT: 36.4 % — ABNORMAL LOW (ref 39.0–52.0)
Hemoglobin: 11.2 g/dL — ABNORMAL LOW (ref 13.0–17.0)
Immature Granulocytes: 1 %
Lymphocytes Relative: 22 %
Lymphs Abs: 3 10*3/uL (ref 0.7–4.0)
MCH: 28 pg (ref 26.0–34.0)
MCHC: 30.8 g/dL (ref 30.0–36.0)
MCV: 91 fL (ref 80.0–100.0)
Monocytes Absolute: 0.6 10*3/uL (ref 0.1–1.0)
Monocytes Relative: 4 %
Neutro Abs: 9.7 10*3/uL — ABNORMAL HIGH (ref 1.7–7.7)
Neutrophils Relative %: 73 %
Platelets: 290 10*3/uL (ref 150–400)
RBC: 4 MIL/uL — ABNORMAL LOW (ref 4.22–5.81)
RDW: 13.8 % (ref 11.5–15.5)
WBC: 13.5 10*3/uL — ABNORMAL HIGH (ref 4.0–10.5)
nRBC: 0 % (ref 0.0–0.2)

## 2019-03-10 LAB — TRIGLYCERIDES: Triglycerides: 68 mg/dL (ref <150)

## 2019-03-10 LAB — LACTATE DEHYDROGENASE: LDH: 198 U/L — ABNORMAL HIGH (ref 98–192)

## 2019-03-10 LAB — FIBRINOGEN: Fibrinogen: 658 mg/dL — ABNORMAL HIGH (ref 210–475)

## 2019-03-10 LAB — C-REACTIVE PROTEIN: CRP: 5.2 mg/dL — ABNORMAL HIGH (ref <1.0)

## 2019-03-10 LAB — D-DIMER, QUANTITATIVE: D-Dimer, Quant: 1.31 ug/mL-FEU — ABNORMAL HIGH (ref 0.00–0.50)

## 2019-03-10 LAB — PROCALCITONIN: Procalcitonin: <0.1 ng/mL

## 2019-03-10 LAB — LACTIC ACID, PLASMA: Lactic Acid, Venous: 1.7 mmol/L (ref 0.5–1.9)

## 2019-03-10 LAB — FERRITIN: Ferritin: 362 ng/mL — ABNORMAL HIGH (ref 24–336)

## 2019-03-10 LAB — C-PEPTIDE: C-Peptide: 9.1 ng/mL — ABNORMAL HIGH (ref 1.1–4.4)

## 2019-03-10 MED ORDER — SODIUM CHLORIDE 0.9 % IV SOLN
1000.0000 mL | INTRAVENOUS | Status: DC
  Administered 2019-03-10: 1000 mL via INTRAVENOUS

## 2019-03-10 MED ORDER — CEFDINIR 300 MG PO CAPS: 300.0000 mg | ORAL_CAPSULE | Freq: Two times a day (BID) | ORAL | 0 refills | Status: DC

## 2019-03-10 MED ORDER — SODIUM CHLORIDE 0.9 % IV SOLN
1.0000 g | Freq: Once | INTRAVENOUS | Status: AC
  Administered 2019-03-10: 1 g via INTRAVENOUS
  Filled 2019-03-10: qty 10

## 2019-03-10 NOTE — ED Notes (Signed)
All appropriate discharge materials reviewed at length with patient. Time for questions provided. Pt has no other questions at this time and verbalizes understanding of all provided materials.  All materials and questions discussed with daughter.

## 2019-03-10 NOTE — Discharge Instructions (Addendum)
Compression hose to both legs 

## 2019-03-10 NOTE — ED Triage Notes (Signed)
Pt bib ems, discharged yesterday from a 5 day hospital stay due to covid. ems reports the pt being unresponsive for 1 min. 02 sats 98% on 10L nonrebreather and would drop into the 70's. Pt has hxt of dementia but fairly active at home.   154/80 60 hr  40rr] 98.1*f cbg 102

## 2019-03-10 NOTE — ED Provider Notes (Signed)
[facility] EMERGENCY DEPARTMENT Provider Note   CSN: [id number] Arrival date & time: 03/10/19  1719     History Chief Complaint  Patient presents with  . Near Syncope    Jeffrey Huerta is a 74 y.o. male.  Pt presents to the ED today with a near-syncopal event.  Pt was admitted for hypoxia due to Covid from 1/15-1/19.  I called the pt's family to see what happened.  His daughter said he did ok yesterday.  He had not wanted to eat or drink much today.  He urinated on himself, so she got him up to clean him and he became less responsive.  He started off into space.  She got him down and he came around.  Pt has a hx of orthostatic hypotension and does not currently wear any compression hose.  Pt said he feels fine now.  He has dementia, so does not remember the incident.        Past Medical History:  Diagnosis Date  . Aneurysm ([place])   . AVM (arteriovenous malformation)   . Dementia ([place])   . 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 Hypertension     Patient Active Problem List   Diagnosis Date Noted  . Hypoglycemia   . Acute respiratory disease due to COVID-19 virus 03/05/2019  . Essential hypertension 03/05/2019  . Dehydration 02/09/2017  . Dementia ([place]) 02/09/2017  . 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 Rhabdomyolysis 02/09/2017  . Memory loss 06/21/2015  . AVM (arteriovenous malformation) 06/21/2015    Past Surgical History:  Procedure Laterality Date  . VENTRICULOPERITONEAL SHUNT     prior to 2008       Family History  Problem Relation Age of Onset  . Stroke Mother   . Hypertension Mother     Social History   Tobacco Use  . Smoking status: Never Smoker  . Smokeless tobacco: Never Used  Substance Use Topics  . Alcohol use: No    Alcohol/week: 0.0 standard drinks  . Drug use: No    Home Medications Prior to Admission medications   Medication Sig Start Date End Date Taking? Authorizing Provider  acetaminophen (TYLENOL) 500 MG tablet Take  1,000 mg by mouth every 6 (six) hours as needed for mild pain or fever.    [provider]  albuterol (VENTOLIN HFA) 108 (90 Base) MCG/ACT inhaler Inhale 2 puffs into the lungs every 2 (two) hours as needed for wheezing or shortness of breath. 03/09/19   Hosie Poisson, MD  Ascorbic Acid (VITAMIN C) 250 MG CHEW Chew 500 mg by mouth daily.    [provider]  cefdinir (OMNICEF) 300 MG capsule Take 1 capsule (300 mg total) by mouth 2 (two) times daily. 03/10/19   Isla Pence, MD  dexamethasone (DECADRON) 6 MG tablet Take 1 tablet (6 mg total) by mouth daily. 03/09/19   Hosie Poisson, MD  feeding supplement, ENSURE ENLIVE, (ENSURE ENLIVE) LIQD Take 237 mLs by mouth 3 (three) times daily between meals. 03/09/19   Hosie Poisson, MD  guaiFENesin-dextromethorphan (ROBITUSSIN DM) 100-10 MG/5ML syrup Take 10 mLs by mouth every 4 (four) hours as needed for cough. 03/09/19   Hosie Poisson, MD    Allergies    Patient has no known allergies.  Review of Systems   Review of Systems  Unable to perform ROS: Dementia    Physical Exam Updated Vital Signs BP (!) 125/101 (BP Location: Right Arm)   Pulse (!) 50  Temp 99.7 F (37.6 C) (Oral)   Resp 16   Ht 5\' 5"  (1.651 m)   Wt 74.8 kg   SpO2 100%   BMI 27.46 kg/m   Physical Exam Vitals and nursing note reviewed.  Constitutional:      Appearance: Normal appearance.  HENT:     Head: Normocephalic and atraumatic.     Right Ear: External ear normal.     Left Ear: External ear normal.     Nose: Nose normal.     Mouth/Throat:     Mouth: Mucous membranes are moist.     Pharynx: Oropharynx is clear.  Eyes:     Extraocular Movements: Extraocular movements intact.     Conjunctiva/sclera: Conjunctivae normal.     Pupils: Pupils are equal, round, and reactive to light.  Cardiovascular:     Rate and Rhythm: Normal rate and regular rhythm.     Pulses: Normal pulses.     Heart sounds: Normal heart sounds.  Pulmonary:     Effort:  Pulmonary effort is normal.     Breath sounds: Normal breath sounds.  Abdominal:     General: Abdomen is flat. Bowel sounds are normal.     Palpations: Abdomen is soft.  Musculoskeletal:        General: Normal range of motion.     Cervical back: Normal range of motion and neck supple.  Skin:    General: Skin is warm.     Capillary Refill: Capillary refill takes less than 2 seconds.  Neurological:     General: No focal deficit present.     Mental Status: He is alert and oriented to person, place, and time.  Psychiatric:        Mood and Affect: Mood normal.        Behavior: Behavior normal.        Thought Content: Thought content normal.        Judgment: Judgment normal.     ED Results / Procedures / Treatments   Labs (all labs ordered are listed, but only abnormal results are displayed) Labs Reviewed  CBC WITH DIFFERENTIAL/PLATELET - Abnormal; Notable for the following components:      Result Value   WBC 13.5 (*)    RBC 4.00 (*)    Hemoglobin 11.2 (*)    HCT 36.4 (*)    Neutro Abs 9.7 (*)    Abs Immature Granulocytes 0.16 (*)    All other components within normal limits  COMPREHENSIVE METABOLIC PANEL - Abnormal; Notable for the following components:   Sodium 134 (*)    Glucose, Bld 130 (*)    Calcium 7.9 (*)    Total Protein 6.4 (*)    Albumin 2.1 (*)    All other components within normal limits  D-DIMER, QUANTITATIVE (NOT AT [facility]) - Abnormal; Notable for the following components:   D-Dimer, Quant 1.31 (*)    All other components within normal limits  LACTATE DEHYDROGENASE - Abnormal; Notable for the following components:   LDH 198 (*)    All other components within normal limits  FERRITIN - Abnormal; Notable for the following components:   Ferritin 362 (*)    All other components within normal limits  FIBRINOGEN - Abnormal; Notable for the following components:   Fibrinogen 658 (*)    All other components within normal limits  C-REACTIVE PROTEIN - Abnormal;  Notable for the following components:   CRP 5.2 (*)    All other components within normal limits  URINALYSIS,  ROUTINE W REFLEX MICROSCOPIC - Abnormal; Notable for the following components:   APPearance HAZY (*)    Hgb urine dipstick MODERATE (*)    Nitrite POSITIVE (*)    Leukocytes,Ua LARGE (*)    RBC / HPF >50 (*)    WBC, UA >50 (*)    Bacteria, UA MANY (*)    All other components within normal limits  CULTURE, BLOOD (ROUTINE X 2)  CULTURE, BLOOD (ROUTINE X 2)  URINE CULTURE  LACTIC ACID, PLASMA  PROCALCITONIN  TRIGLYCERIDES  LACTIC ACID, PLASMA    EKG EKG Interpretation  Date/Time:  Wednesday March 10 2019 19:38:38 EST Ventricular Rate:  56 PR Interval:    QRS Duration: 90 QT Interval:  473 QTC Calculation: 457 R Axis:   57 Text Interpretation: Sinus rhythm Borderline T abnormalities, inferior leads No significant change since last tracing Confirmed by Isla Pence [phone]) on 03/10/2019 7:46:06 PM   Radiology CT Head Wo Contrast  Result Date: 03/10/2019 CLINICAL DATA:  Focal neurologic deficit. EXAM: CT HEAD WITHOUT CONTRAST TECHNIQUE: Contiguous axial images were obtained from the base of the skull through the vertex without intravenous contrast. COMPARISON:  November 07, 2018 FINDINGS: Brain: There is mild cerebral atrophy with widening of the extra-axial spaces and ventricular dilatation. There are areas of decreased attenuation within the white matter tracts of the supratentorial brain, consistent with microvascular disease changes. An area of cortical encephalomalacia, with adjacent chronic white matter low attenuation is seen within the medial aspect of the cerebellum on the left. This is present on the prior study. Stable right-sided ventriculostomy catheter positioning is seen. Vascular: No hyperdense vessel or unexpected calcification. Skull: A sub occipital craniotomy defect is again seen. Sinuses/Orbits: No acute finding. Other: None. IMPRESSION: 1. Generalized  cerebral atrophy. 2. No acute intracranial abnormality. 3. Stable postoperative and chronic ischemic changes, as described above. 4. Stable right-sided ventriculostomy catheter positioning when compared to the prior study dated November 07, 2018. Electronically Signed   By: Virgina Norfolk M.D.   On: 03/10/2019 21:45   DG Chest Port 1 View  Result Date: 03/10/2019 CLINICAL DATA:  Shortness of breath EXAM: PORTABLE CHEST 1 VIEW COMPARISON:  03/05/2019 FINDINGS: A right-sided VP shunt is again noted. The shunt appears stable. Peripheral airspace opacities are noted primarily in the right lung zone. There is no pneumothorax. No large pleural effusion. There may be left basilar airspace opacities. The heart size is stable from prior study. Aortic calcifications are noted. IMPRESSION: Findings concerning for right-sided pneumonia in the appropriate clinical setting. This may be viral or bacterial in etiology. Electronically Signed   By: Constance Holster M.D.   On: 03/10/2019 19:18    Procedures Procedures (including critical care time)  Medications Ordered in ED Medications  0.9 %  sodium chloride infusion (1,000 mLs Intravenous New Bag/Given 03/10/19 2004)  cefTRIAXone (ROCEPHIN) 1 g in sodium chloride 0.9 % 100 mL IVPB (1 g Intravenous New Bag/Given 03/10/19 2110)    ED Course  I have reviewed the triage vital signs and the nursing notes.  Pertinent labs & imaging results that were available during my care of the patient were reviewed by me and considered in my medical decision making (see chart for details).    MDM Rules/Calculators/A&P                      Pt is not hypoxic and looks good.  He has a pna on cxr, likely from residual covid.  He does  also have a UTI.  He was given 1g of rocephin in ED.  He will be treated with omnicef at home for UTI and PNA in case it is not from Covid.  Pt discussed with his wife and daughter.  They request home health, so I put in a consult for that  service.  I told them to get some compression hose as it looks like he's had these near-syncopal events frequently with standing.  Pt is stable for d/c.  Return if worse. Final Clinical Impression(s) / ED Diagnoses Final diagnoses:  Acute cystitis without hematuria  COVID-19 virus infection  Orthostatic syncope    Rx / DC Orders ED Discharge Orders         Ordered    cefdinir (OMNICEF) 300 MG capsule  2 times daily     03/10/19 2208           Isla Pence, MD 03/10/19 2214

## 2019-03-11 ENCOUNTER — Telehealth: Payer: Self-pay | Admitting: Surgery

## 2019-03-11 NOTE — Telephone Encounter (Addendum)
This CM received a message concerning this patient and [place] arrangement. CM reviewed record patient was discharged from 2W  with [place] orders, 1/19.  Patient returned to ED and EDP placed referral for [facility] orders last night off shift.  Patient has [place], which limits choices ED CM will send referral to several different [place] agencies to find an available agency ( [facility] , [facility], [facility], [facility], [facility]) CM will contact patient and family with update.

## 2019-03-12 LAB — URINE CULTURE

## 2019-03-15 ENCOUNTER — Telehealth: Payer: Self-pay

## 2019-03-15 LAB — CULTURE, BLOOD (ROUTINE X 2): Culture: NO GROWTH

## 2019-03-15 LAB — SULFONYLUREA HYPOGLYCEMICS PANEL, SERUM
Acetohexamide: NEGATIVE ug/mL (ref 20–60)
Chlorpropamide: NEGATIVE ug/mL (ref 75–250)
Glimepiride: NEGATIVE ng/mL (ref 80–250)
Glipizide: NEGATIVE ng/mL (ref 200–1000)
Glyburide: NEGATIVE ng/mL
Nateglinide: NEGATIVE ng/mL
Repaglinide: NEGATIVE ng/mL
Tolazamide: NEGATIVE ug/mL
Tolbutamide: NEGATIVE ug/mL (ref 40–100)

## 2019-03-15 NOTE — Telephone Encounter (Signed)
Patient wife called to advise she has memory concerns for him and states patient has tested positive for covid and his quarantine will end FEB 3rd and they are unable to setup mychart. Patient wife is concerned whether it is due to covid or dementia his memory concerns.

## 2019-03-15 NOTE — Telephone Encounter (Signed)
I called wife.  He is getting over covid, and UTI.  His memory has declined.  He is not on namenda or aricept.  [facility].  Has appt in Thursday with pcp.  She will call back sooner if needed.  Made 05-17-19 1300 appt RV.

## 2019-03-16 LAB — PROINSULIN/INSULIN RATIO
Insulin: 16 u[IU]/mL
Proinsulin/Insulin Ratio: 44 %
Proinsulin: 47 pmol/L

## 2019-03-16 NOTE — Discharge Summary (Signed)
Physician Discharge Summary  Jeffrey Huerta [id number] DOB: 1945/12/30 DOA: 03/05/2019  PCP: Nolene Ebbs, MD  Admit date: 03/05/2019 Discharge date: 03/09/2019  Admitted From: Home.  Disposition:  Home.   Recommendations for Outpatient Follow-up:  1. Follow up with PCP in 1-2 weeks 2. Please obtain BMP/CBC in one week 3. Please check for Addison's disease as outpatient.   Discharge Condition:stable.  CODE STATUS:full code.  Diet recommendation: Heart Healthy   Brief/Interim Summary: 74 year-old gentleman prior history of hypertension, dementia, hydrocephalus with VP shunt presents with hypoxia and altered mental status.  On arrival to ED he was tested positive for Covid and required up to 2 L of nasal cannula oxygen to keep sats greater than 90%.  He was admitted for acute respiratory failure with mild hypoxia secondary to Covid 19 viral infection. Pt became orthostatic while working with PT and he was symptomatic, with dizziness and decreased tolerance to PT. His BP improved on laying down. Ordered compression stockings. Suspect he has a component of autonomic dysfunction. repeat orthostatics on the day of discharge were normal.   Discharge Diagnoses:  Principal Problem:   Acute respiratory disease due to COVID-19 virus Active Problems:   Dementia ([place])   Essential hypertension   Hypoglycemia  Acute respiratory failure with hypoxia secondary to COVID-19 viral infection Patient is currently on room air with good oxygen saturations. Completed 5 day sof Remdesivir and discharged on oral decadron to complete the course.  Patient remains afebrile and WBC count within normal limits..  Pro Calcitonin less than 0.1.     Essential hypertension Blood pressure parameters while laying down within normal limits.  Repeat orthostatics were normal.  Ordered compression stockings.   Hypoglycemia Unclear etiology possibly from insufficient calorie intake in the setting of COVID-19  infection. Liberalize his diet Patient's hemoglobin A1c is 5.6. In view of his hypoglycemia,  hypotension recommend outpatient work-up for possible Addison's disease.  Mild normocytic anemia Anemia panel shows low iron levels.  Hemoglobin stable around 10. Iron supplementation will be added.  Dementia Without any behavioral abnormalities   Bradycardia EKG shows sinus bradycardia and asymptomatic.   Discharge Instructions  Discharge Instructions    MyChart COVID-19 home monitoring program   Complete by: Mar 09, 2019    Is the patient willing to use the [place] for home monitoring?: Yes   Temperature monitoring   Complete by: Mar 09, 2019    After how many days would you like to receive a notification of this patient's flowsheet entries?: 1   Discharge instructions   Complete by: As directed    Please follow up with PCP in 2 to 3 weeks.     Allergies as of 03/09/2019   No Known Allergies     Medication List    STOP taking these medications   losartan 50 MG tablet Commonly known as: COZAAR     TAKE these medications   acetaminophen 500 MG tablet Commonly known as: TYLENOL Take 1,000 mg by mouth every 6 (six) hours as needed for mild pain or fever.   albuterol 108 (90 Base) MCG/ACT inhaler Commonly known as: VENTOLIN HFA Inhale 2 puffs into the lungs every 2 (two) hours as needed for wheezing or shortness of breath.   dexamethasone 6 MG tablet Commonly known as: DECADRON Take 1 tablet (6 mg total) by mouth daily.   feeding supplement (ENSURE ENLIVE) Liqd Take 237 mLs by mouth 3 (three) times daily between meals.   guaiFENesin-dextromethorphan 100-10 MG/5ML syrup Commonly  known as: ROBITUSSIN DM Take 10 mLs by mouth every 4 (four) hours as needed for cough.   Vitamin C 250 MG Chew Chew 500 mg by mouth daily.      Follow-up Information    Nolene Ebbs, MD. Schedule an appointment as soon as possible for a visit in 1 week(s).   Specialty:  Internal Medicine Contact information: [address] [place] [address] [phone]          No Known Allergies  Consultations:  None.    Procedures/Studies: CT Head Wo Contrast  Result Date: 03/10/2019 CLINICAL DATA:  Focal neurologic deficit. EXAM: CT HEAD WITHOUT CONTRAST TECHNIQUE: Contiguous axial images were obtained from the base of the skull through the vertex without intravenous contrast. COMPARISON:  November 07, 2018 FINDINGS: Brain: There is mild cerebral atrophy with widening of the extra-axial spaces and ventricular dilatation. There are areas of decreased attenuation within the white matter tracts of the supratentorial brain, consistent with microvascular disease changes. An area of cortical encephalomalacia, with adjacent chronic white matter low attenuation is seen within the medial aspect of the cerebellum on the left. This is present on the prior study. Stable right-sided ventriculostomy catheter positioning is seen. Vascular: No hyperdense vessel or unexpected calcification. Skull: A sub occipital craniotomy defect is again seen. Sinuses/Orbits: No acute finding. Other: None. IMPRESSION: 1. Generalized cerebral atrophy. 2. No acute intracranial abnormality. 3. Stable postoperative and chronic ischemic changes, as described above. 4. Stable right-sided ventriculostomy catheter positioning when compared to the prior study dated November 07, 2018. Electronically Signed   By: Virgina Norfolk M.D.   On: 03/10/2019 21:45   DG Chest Port 1 View  Result Date: 03/10/2019 CLINICAL DATA:  Shortness of breath EXAM: PORTABLE CHEST 1 VIEW COMPARISON:  03/05/2019 FINDINGS: A right-sided VP shunt is again noted. The shunt appears stable. Peripheral airspace opacities are noted primarily in the right lung zone. There is no pneumothorax. No large pleural effusion. There may be left basilar airspace opacities. The heart size is stable from prior study. Aortic calcifications are  noted. IMPRESSION: Findings concerning for right-sided pneumonia in the appropriate clinical setting. This may be viral or bacterial in etiology. Electronically Signed   By: Constance Holster M.D.   On: 03/10/2019 19:18   DG Chest Port 1 View  Result Date: 03/05/2019 CLINICAL DATA:  COVID positive EXAM: PORTABLE CHEST 1 VIEW COMPARISON:  02/09/2017 FINDINGS: Normal heart size and mild aortic tortuosity. There could be subtle infiltrate in the right mid lung. No edema, effusion, or pneumothorax. VP shunt traversing the right chest. IMPRESSION: Equivocal for early pneumonia on the right. Electronically Signed   By: Monte Fantasia M.D.   On: 03/05/2019 06:44       Subjective:  No new complaints.  Discharge Exam: Vitals:   03/09/19 1600 03/09/19 1726  BP: 129/69 (!) 144/74  Pulse: (!) 52 (!) 48  Resp:  16  Temp: 98.8 F (37.1 C) 98.1 F (36.7 C)  SpO2: 100% 96%   Vitals:   03/09/19 0325 03/09/19 0824 03/09/19 1600 03/09/19 1726  BP:  (!) 151/65 129/69 (!) 144/74  Pulse:  (!) 44 (!) 52 (!) 48  Resp:    16  Temp:  98.4 F (36.9 C) 98.8 F (37.1 C) 98.1 F (36.7 C)  TempSrc:  Oral Oral Oral  SpO2:  100% 100% 96%  Weight: (!) 165 kg     Height:        General: Pt is alert, awake, not in  acute distress Cardiovascular: RRR, S1/S2 +, no rubs, no gallops Respiratory: CTA bilaterally, no wheezing, no rhonchi Abdominal: Soft, NT, ND, bowel sounds + Extremities: no edema, no cyanosis    The results of significant diagnostics from this hospitalization (including imaging, microbiology, ancillary and laboratory) are listed below for reference.     Microbiology: Recent Results (from the past 240 hour(s))  Blood Culture (routine x 2)     Status: None   Collection Time: 03/10/19  5:46 PM   Specimen: BLOOD  Result Value Ref Range Status   Specimen Description BLOOD RIGHT ANTECUBITAL  Final   Special Requests   Final    BOTTLES DRAWN AEROBIC AND ANAEROBIC Blood Culture results  may not be optimal due to an excessive volume of blood received in culture bottles   Culture   Final    NO GROWTH 5 DAYS Performed at [facility], [place] [address], [place], [place] [address]    Report Status 03/15/2019 FINAL  Final  Urine culture     Status: Abnormal   Collection Time: 03/10/19  8:45 PM   Specimen: Urine, Random  Result Value Ref Range Status   Specimen Description URINE, RANDOM  Final   Special Requests   Final    NONE Performed at [facility], [place] [address] [place], [place] [address]    Culture MULTIPLE SPECIES PRESENT, SUGGEST RECOLLECTION (A)  Final   Report Status 03/12/2019 FINAL  Final     Labs: BNP (last 3 results) No results for input(s): BNP in the last 8760 hours. Basic Metabolic Panel: Recent Labs  Lab 03/10/19 1822  NA 134*  K 4.8  CL 102  CO2 24  GLUCOSE 130*  BUN 15  CREATININE 0.93  CALCIUM 7.9*   Liver Function Tests: Recent Labs  Lab 03/10/19 1822  AST 27  ALT 27  ALKPHOS 44  BILITOT 0.5  PROT 6.4*  ALBUMIN 2.1*   No results for input(s): LIPASE, AMYLASE in the last 168 hours. No results for input(s): AMMONIA in the last 168 hours. CBC: Recent Labs  Lab 03/10/19 1822  WBC 13.5*  NEUTROABS 9.7*  HGB 11.2*  HCT 36.4*  MCV 91.0  PLT 290   Cardiac Enzymes: No results for input(s): CKTOTAL, CKMB, CKMBINDEX, TROPONINI in the last 168 hours. BNP: Invalid input(s): POCBNP CBG: No results for input(s): GLUCAP in the last 168 hours. D-Dimer No results for input(s): DDIMER in the last 72 hours. Hgb A1c No results for input(s): HGBA1C in the last 72 hours. Lipid Profile No results for input(s): CHOL, HDL, LDLCALC, TRIG, CHOLHDL, LDLDIRECT in the last 72 hours. Thyroid function studies No results for input(s): TSH, T4TOTAL, T3FREE, THYROIDAB in the last 72 hours.  Invalid input(s): FREET3 Anemia work up No results for input(s): VITAMINB12, FOLATE, FERRITIN, TIBC, IRON, RETICCTPCT in the last 72  hours. Urinalysis    Component Value Date/Time   COLORURINE YELLOW 03/10/2019 1943   APPEARANCEUR HAZY (A) 03/10/2019 1943   LABSPEC 1.011 03/10/2019 1943   PHURINE 7.0 03/10/2019 1943   GLUCOSEU NEGATIVE 03/10/2019 [place] (A) 03/10/2019 [place] 03/10/2019 [place] 03/10/2019 1943   PROTEINUR NEGATIVE 03/10/2019 1943   UROBILINOGEN 0.2 02/21/2010 0534   NITRITE POSITIVE (A) 03/10/2019 1943   LEUKOCYTESUR LARGE (A) 03/10/2019 1943   Sepsis Labs Invalid input(s): PROCALCITONIN,  WBC,  LACTICIDVEN Microbiology Recent Results (from the past 240 hour(s))  Blood Culture (routine x 2)  Status: None   Collection Time: 03/10/19  5:46 PM   Specimen: BLOOD  Result Value Ref Range Status   Specimen Description BLOOD RIGHT ANTECUBITAL  Final   Special Requests   Final    BOTTLES DRAWN AEROBIC AND ANAEROBIC Blood Culture results may not be optimal due to an excessive volume of blood received in culture bottles   Culture   Final    NO GROWTH 5 DAYS Performed at [facility], [place] [address] [place], [place] [address]    Report Status 03/15/2019 FINAL  Final  Urine culture     Status: Abnormal   Collection Time: 03/10/19  8:45 PM   Specimen: Urine, Random  Result Value Ref Range Status   Specimen Description URINE, RANDOM  Final   Special Requests   Final    NONE Performed at [facility], [place] [address] [place], [place] [address]    Culture MULTIPLE SPECIES PRESENT, SUGGEST RECOLLECTION (A)  Final   Report Status 03/12/2019 FINAL  Final     Time coordinating discharge: 36 minutes  SIGNED:   Hosie Poisson, MD  [facility]

## 2019-03-17 ENCOUNTER — Ambulatory Visit: Payer: Medicare Other | Admitting: Adult Health

## 2019-03-18 ENCOUNTER — Telehealth: Payer: Self-pay | Admitting: Surgery

## 2019-03-18 NOTE — Telephone Encounter (Signed)
ED CM received message from CM office, [place] CMA. Patient's wife called stating she has not gotten a call concerning [place] as of yet.  ED CM reviewed record patient was discharged from [facility] last week with [facility] orders, noted that patient has [facility] insurance which has some limitations with finding a [place] agency to accept the referral . This CM received message last week and attempted resend orders to [facility] agencies. CM contacted spouse who reports she still have not received an initial call.  CM explained that referral was sent out to several [facility] agencies. CM contacted liaison at [facility] with referral, and they have agreed to accept the referral but cannot start service until Monday, wife is agreeable. She states, "He  is doing well has good family support but, looks forward to the Therapy" CM provided contact information for ED CM and [facility] if she does not receive a call within 48 hours. She verbalized understanding, CM will continue to follow up with patient and family

## 2019-05-17 ENCOUNTER — Other Ambulatory Visit: Payer: Self-pay

## 2019-05-17 ENCOUNTER — Ambulatory Visit: Payer: Medicare Other | Admitting: Adult Health

## 2019-05-17 ENCOUNTER — Encounter: Payer: Self-pay | Admitting: Adult Health

## 2019-05-17 VITALS — BP 136/74 | HR 69 | Temp 98.3°F | Ht 63.0 in | Wt 125.0 lb

## 2019-05-17 DIAGNOSIS — G309 Alzheimer's disease, unspecified: Secondary | ICD-10-CM

## 2019-05-17 DIAGNOSIS — F0281 Dementia in other diseases classified elsewhere with behavioral disturbance: Secondary | ICD-10-CM | POA: Diagnosis not present

## 2019-05-17 MED ORDER — DONEPEZIL HCL 10 MG PO TABS: ORAL_TABLET | ORAL | 5 refills | Status: DC

## 2019-05-17 NOTE — Progress Notes (Signed)
PATIENT: Jeffrey Huerta DOB: 1945-12-16  REASON FOR VISIT: follow up HISTORY FROM: patient  HISTORY OF PRESENT ILLNESS: Today 05/17/19: Jeffrey Huerta is a 74 year old male with a history of Alzheimer's disease.  He returns today for follow-up.  He is currently not on any medication for his memory.  At the last visit his wife reported that she was having a hard time giving him medication.  She reports that he had Covid in January and was in the hospital for 1 week.  He is now doing better.  At home he requires assistance/supervision with most ADLs.  She manages all the finances.  He no longer operates a Teacher, music.  She reports that for the most part his mood is pleasant.  He often sings old hymns  to her.  Returns today for follow-up HISTORY 07/06/18:  Jeffrey Huerta is a 74 year old male with a history of memory disturbance.  He returns today for virtual visit.  I spoke to his wife.  She reports [facility] due to [facility]-19.  She states that they are planning on reopening June 1st she states that since he has been home with her he has been staying in bed more throughout the day.  Which in turn he stays up at night.  He requires prompting and supervision with ADLs.  He does not take his medication.  He reports that he will not take any of his medications despite what she tries.  He no longer operates a Teacher, music.  She reports good appetite.  She states that in regards to his mood and behavior he gets agitated easily and will curse but he does not get physical.  I spoke to the patient he was very limited in his answers.  Reports that he "feels good."  REVIEW OF SYSTEMS: Out of a complete 14 system review of symptoms, the patient complains only of the following symptoms, and all other reviewed systems are negative.  See HPI  ALLERGIES: No Known Allergies  HOME MEDICATIONS: Outpatient Medications Prior to Visit  Medication Sig Dispense Refill  . acetaminophen  (TYLENOL) 500 MG tablet Take 1,000 mg by mouth every 6 (six) hours as needed for mild pain or fever.    Marland Kitchen albuterol (VENTOLIN HFA) 108 (90 Base) MCG/ACT inhaler Inhale 2 puffs into the lungs every 2 (two) hours as needed for wheezing or shortness of breath. 18 g 0  . Ascorbic Acid (VITAMIN C) 250 MG CHEW Chew 500 mg by mouth daily.    . feeding supplement, ENSURE ENLIVE, (ENSURE ENLIVE) LIQD Take 237 mLs by mouth 3 (three) times daily between meals. 237 mL 12  . cefdinir (OMNICEF) 300 MG capsule Take 1 capsule (300 mg total) by mouth 2 (two) times daily. 14 capsule 0  . dexamethasone (DECADRON) 6 MG tablet Take 1 tablet (6 mg total) by mouth daily. 5 tablet 0  . guaiFENesin-dextromethorphan (ROBITUSSIN DM) 100-10 MG/5ML syrup Take 10 mLs by mouth every 4 (four) hours as needed for cough. 118 mL 0   No facility-administered medications prior to visit.    PAST MEDICAL HISTORY: Past Medical History:  Diagnosis Date  . Aneurysm ([place])   . AVM (arteriovenous malformation)   . Dementia ([place])   . Hydrocephalus ([place])    VP shunt in place  . Hypertension     PAST SURGICAL HISTORY: Past Surgical History:  Procedure Laterality Date  . VENTRICULOPERITONEAL SHUNT     prior to 2008    FAMILY  HISTORY: Family History  Problem Relation Age of Onset  . Stroke Mother   . Hypertension Mother     SOCIAL HISTORY: Social History   Socioeconomic History  . Marital status: Married    Spouse name: Not on file  . Number of children: 3  . Years of education: 10th  . Highest education level: Not on file  Occupational History  . Occupation: Retired  Tobacco Use  . Smoking status: Never Smoker  . Smokeless tobacco: Never Used  Substance and Sexual Activity  . Alcohol use: No    Alcohol/week: 0.0 standard drinks  . Drug use: No  . Sexual activity: Not on file  Other Topics Concern  . Not on file  Social History Narrative   Lives at home with wife.   Right-handed.   No caffeine use.    Social Determinants of Health   Financial Resource Strain:   . Difficulty of Paying Living Expenses:   Food Insecurity:   . Worried About Charity fundraiser in the Last Year:   . Arboriculturist in the Last Year:   Transportation Needs:   . Film/video editor (Medical):   Marland Kitchen Lack of Transportation (Non-Medical):   Physical Activity:   . Days of Exercise per Week:   . Minutes of Exercise per Session:   Stress:   . Feeling of Stress :   Social Connections:   . Frequency of Communication with Friends and Family:   . Frequency of Social Gatherings with Friends and Family:   . Attends Religious Services:   . Active Member of Clubs or Organizations:   . Attends Archivist Meetings:   Marland Kitchen Marital Status:   Intimate Partner Violence:   . Fear of Current or Ex-Partner:   . Emotionally Abused:   Marland Kitchen Physically Abused:   . Sexually Abused:       PHYSICAL EXAM  Vitals:   05/17/19 1253  BP: 136/74  Pulse: 69  Temp: 98.3 F (36.8 C)  Weight: 125 lb (56.7 kg)  Height: 5\' 3"  (1.6 m)   Body mass index is 22.14 kg/m.   MMSE - Mini Mental State Exam 05/17/2019 03/13/2017 01/01/2017  Orientation to time 1 1 1   Orientation to Place 2 4 4   Registration 1 3 1   Attention/ Calculation 1 0 0  Recall 0 0 0  Language- name 2 objects 2 2 2   Language- repeat 1 1 0  Language- follow 3 step command 3 3 1   Language- follow 3 step command-comments - - folded twice, handed to RN  Language- read & follow direction 1 1 1   Write a sentence 0 1 0  Copy design 0 1 1  Total score 12 17 11      Generalized: Well developed, in no acute distress   Neurological examination  Mentation: Alert oriented to time, place, history taking. Follows all commands speech and language fluent Cranial nerve II-XII: Pupils were equal round reactive to light. Extraocular movements were full, visual field were full on confrontational test. Facial sensation and strength were normal. Uvula tongue midline.  Head turning and shoulder shrug  were normal and symmetric. Motor: The motor testing reveals 5 over 5 strength of all 4 extremities. Good symmetric motor tone is noted throughout.  Sensory: Sensory testing is intact to soft touch on all 4 extremities. No evidence of extinction is noted.  Coordination: Cerebellar testing reveals good finger-nose-finger and heel-to-shin bilaterally.  Gait and station: Patient uses a cane when  ambulating.  Tandem gait not attempted. Reflexes: Deep tendon reflexes are symmetric and normal bilaterally.   DIAGNOSTIC DATA (LABS, IMAGING, TESTING) - I reviewed patient records, labs, notes, testing and imaging myself where available.  Lab Results  Component Value Date   WBC 13.5 (H) 03/10/2019   HGB 11.2 (L) 03/10/2019   HCT 36.4 (L) 03/10/2019   MCV 91.0 03/10/2019   PLT 290 03/10/2019      Component Value Date/Time   NA 134 (L) 03/10/2019 1822   K 4.8 03/10/2019 1822   CL 102 03/10/2019 1822   CO2 24 03/10/2019 1822   GLUCOSE 130 (H) 03/10/2019 1822   BUN 15 03/10/2019 1822   CREATININE 0.93 03/10/2019 1822   CALCIUM 7.9 (L) 03/10/2019 1822   PROT 6.4 (L) 03/10/2019 1822   ALBUMIN 2.1 (L) 03/10/2019 1822   AST 27 03/10/2019 1822   ALT 27 03/10/2019 1822   ALKPHOS 44 03/10/2019 1822   BILITOT 0.5 03/10/2019 1822   GFRNONAA >60 03/10/2019 1822   GFRAA >60 03/10/2019 1822   Lab Results  Component Value Date   CHOL (H) 02/22/2010    248        ATP III CLASSIFICATION:  <200     mg/dL   Desirable  200-239  mg/dL   Borderline High  >=240    mg/dL   High          HDL 61 02/22/2010   LDLCALC (H) 02/22/2010    173        Total Cholesterol/HDL:CHD Risk Coronary Heart Disease Risk Table                     Men   Women  1/2 Average Risk   3.4   3.3  Average Risk       5.0   4.4  2 X Average Risk   9.6   7.1  3 X Average Risk  23.4   11.0        Use the calculated Patient Ratio above and the CHD Risk Table to determine the patient's CHD Risk.         ATP III CLASSIFICATION (LDL):  <100     mg/dL   Optimal  100-129  mg/dL   Near or Above                    Optimal  130-159  mg/dL   Borderline  160-189  mg/dL   High  >190     mg/dL   Very High   TRIG 68 03/10/2019   CHOLHDL 4.1 02/22/2010   Lab Results  Component Value Date   HGBA1C 5.6 03/07/2019   Lab Results  Component Value Date   [id number] 03/07/2019   No results found for: TSH    ASSESSMENT AND PLAN 74 y.o. year old male  has a past medical history of Aneurysm ([place]), AVM (arteriovenous malformation), Dementia ([place]), Hydrocephalus ([place]), and Hypertension. here with:  1.  Alzheimer's dementia  -Patient's daughter and wife would like to restart Aricept.  He will restart at 5 mg for 1 week then increase to 10 mg thereafter. -Advised to monitor for diarrhea while on Aricept. -Monitor weight -Advised if symptoms worsen or he develops new symptoms they should let us know. -Follow-up in 6 months or sooner if needed.   I spent 30 minutes of face-to-face and non-face-to-face time with patient.  This included previsit chart review, lab review, study review, order entry, electronic  health record documentation, patient education.  Ward Givens, MSN, NP-C 05/17/2019, 1:07 PM [facility] [address] [place], [place] [address] [phone]

## 2019-05-17 NOTE — Patient Instructions (Signed)
Your Plan:  Restart Aricept 10 mg: take 1/2 tablet at bedtime for 1 week then increase to 1 tablet at bedtime Monitor weight and for diarrhea If your symptoms worsen or you develop new symptoms please let us know.   Thank you for coming to see [place] at [facility]. I hope we have been able to provide you high quality care today.  You may receive a patient satisfaction survey over the next few weeks. We would appreciate your feedback and comments so that we may continue to improve ourselves and the health of our patients.

## 2019-06-04 ENCOUNTER — Ambulatory Visit: Payer: Medicare Other | Attending: Internal Medicine

## 2019-06-04 DIAGNOSIS — Z23 Encounter for immunization: Secondary | ICD-10-CM

## 2019-06-04 NOTE — Progress Notes (Signed)
   Covid-19 Vaccination Clinic  Name:  Zanden Burgers    MRN: [id number] DOB: [birth year]  06/04/2019  Mr. Wallar was observed post Covid-19 immunization for 15 minutes without incident. He was provided with Vaccine Information Sheet and instruction to access the V-Safe system.   Mr. Condit was instructed to call 911 with any severe reactions post vaccine: Marland Kitchen Difficulty breathing  . Swelling of face and throat  . A fast heartbeat  . A bad rash all over body  . Dizziness and weakness   Immunizations Administered    Name Date Dose VIS Date Route   Pfizer COVID-19 Vaccine 06/04/2019  1:33 PM 0.3 mL 01/29/2019 Intramuscular   Manufacturer: [place]   Lot: [id number]   [place]: [id number]

## 2019-06-16 ENCOUNTER — Telehealth: Payer: Self-pay | Admitting: Adult Health

## 2019-06-16 NOTE — Telephone Encounter (Signed)
Completed to MM/NP to sign.

## 2019-06-16 NOTE — Telephone Encounter (Signed)
Sierra from [facility] called me back and said that she attempted to call patient's wife, but she did not answer. I saw from an earlier note that RN mentioned a [facility]. I asked [place] about this. She gave me the number of the [facility] [phone]). I called this number and spoke with Holley Raring, who was familiar with patient and she was able to fax the paperwork for patient again. I received it and gave it to RN.

## 2019-06-16 NOTE — Telephone Encounter (Signed)
Patient's wife came in today asking about paperwork for [facility]. She states [facility] faxed it to [place] a week ago. I stated I would check with NP and RN and we would get it back over to [place].

## 2019-06-16 NOTE — Telephone Encounter (Signed)
I called [facility] and made them aware of the situation. I spoke to their insurance rep, who did not know anything about the patient. She directed me to [place] in Social work. She stated she was not familiar with the patient either, but she took patient's wife's name and number and stated she would call her about the situation and then call us back.

## 2019-06-28 ENCOUNTER — Ambulatory Visit: Payer: Medicare Other | Attending: Internal Medicine

## 2019-06-28 DIAGNOSIS — Z23 Encounter for immunization: Secondary | ICD-10-CM

## 2019-06-28 NOTE — Progress Notes (Signed)
   Covid-19 Vaccination Clinic  Name:  Jeffrey Huerta    MRN: [id number] DOB: 1946/01/07  06/28/2019  Jeffrey Huerta was observed post Covid-19 immunization for 15 minutes without incident. He was provided with Vaccine Information Sheet and instruction to access the V-Safe system.   Jeffrey Huerta was instructed to call 911 with any severe reactions post vaccine: Marland Kitchen Difficulty breathing  . Swelling of face and throat  . A fast heartbeat  . A bad rash all over body  . Dizziness and weakness   Immunizations Administered    Name Date Dose VIS Date Route   Pfizer COVID-19 Vaccine 06/28/2019  4:32 PM 0.3 mL 04/14/2018 Intramuscular   Manufacturer: [place]   Lot: [id number]   [place]: [id number]

## 2019-07-05 ENCOUNTER — Ambulatory Visit: Payer: Self-pay | Admitting: Adult Health

## 2019-11-18 ENCOUNTER — Ambulatory Visit (INDEPENDENT_AMBULATORY_CARE_PROVIDER_SITE_OTHER): Payer: Medicare Other | Admitting: Adult Health

## 2019-11-18 ENCOUNTER — Encounter: Payer: Self-pay | Admitting: Adult Health

## 2019-11-18 VITALS — BP 127/80 | HR 66 | Ht 63.0 in | Wt 128.6 lb

## 2019-11-18 DIAGNOSIS — G309 Alzheimer's disease, unspecified: Secondary | ICD-10-CM | POA: Diagnosis not present

## 2019-11-18 DIAGNOSIS — F0281 Dementia in other diseases classified elsewhere with behavioral disturbance: Secondary | ICD-10-CM | POA: Diagnosis not present

## 2019-11-18 NOTE — Progress Notes (Signed)
PATIENT: Jeffrey Huerta DOB: [birth year]  REASON FOR VISIT: follow up HISTORY FROM: patient  HISTORY OF PRESENT ILLNESS: Today 11/18/19:  Jeffrey Huerta is a 74 year old male with a history of Alzheimer's dementia.  He returns today for follow-up.  He is here today with his wife.  She states overall things have been relatively stable.  Reports that he has good days and bad days in regards to his memory.  He requires assistance with all ADLs.  Denies any trouble sleeping.  He is able to feed himself.  He does not drive.  [facility]  He is no longer taking any of his medication.  He returns today for an evaluation.  HISTORY 05/17/19: Jeffrey Huerta is a 74 year old male with a history of Alzheimer's disease.  He returns today for follow-up.  He is currently not on any medication for his memory.  At the last visit his wife reported that she was having a hard time giving him medication.  She reports that he had Covid in January and was in the hospital for 1 week.  He is now doing better.  At home he requires assistance/supervision with most ADLs.  She manages all the finances.  He no longer operates a Teacher, music.  She reports that for the most part his mood is pleasant.  He often sings old hymns  to her.  Returns today for follow-up  REVIEW OF SYSTEMS: Out of a complete 14 system review of symptoms, the patient complains only of the following symptoms, and all other reviewed systems are negative.  See HPI  ALLERGIES: No Known Allergies  HOME MEDICATIONS: Outpatient Medications Prior to Visit  Medication Sig Dispense Refill  . acetaminophen (TYLENOL) 500 MG tablet Take 1,000 mg by mouth every 6 (six) hours as needed for mild pain or fever.    Marland Kitchen albuterol (VENTOLIN HFA) 108 (90 Base) MCG/ACT inhaler Inhale 2 puffs into the lungs every 2 (two) hours as needed for wheezing or shortness of breath. 18 g 0  . Ascorbic Acid (VITAMIN C) 250 MG CHEW Chew 500 mg by mouth daily.    Marland Kitchen  donepezil (ARICEPT) 10 MG tablet Take 1/2 tablet at bedtime for 1 week then increase to 1 tablet at bedtime 30 tablet 5  . feeding supplement, ENSURE ENLIVE, (ENSURE ENLIVE) LIQD Take 237 mLs by mouth 3 (three) times daily between meals. 237 mL 12   No facility-administered medications prior to visit.    PAST MEDICAL HISTORY: Past Medical History:  Diagnosis Date  . Aneurysm ([place])   . AVM (arteriovenous malformation)   . Dementia ([place])   . Hydrocephalus ([place])    VP shunt in place  . Hypertension     PAST SURGICAL HISTORY: Past Surgical History:  Procedure Laterality Date  . VENTRICULOPERITONEAL SHUNT     prior to 2008    FAMILY HISTORY: Family History  Problem Relation Age of Onset  . Stroke Mother   . Hypertension Mother     SOCIAL HISTORY: Social History   Socioeconomic History  . Marital status: Married    Spouse name: Not on file  . Number of children: 3  . Years of education: 10th  . Highest education level: Not on file  Occupational History  . Occupation: Retired  Tobacco Use  . Smoking status: Never Smoker  . Smokeless tobacco: Never Used  Vaping Use  . Vaping Use: Never used  Substance and Sexual Activity  . Alcohol use: No  Alcohol/week: 0.0 standard drinks  . Drug use: No  . Sexual activity: Not on file  Other Topics Concern  . Not on file  Social History Narrative   Lives at home with wife.   Right-handed.   No caffeine use.   Social Determinants of Health   Financial Resource Strain:   . Difficulty of Paying Living Expenses: Not on file  Food Insecurity:   . Worried About Charity fundraiser in the Last Year: Not on file  . Ran Out of Food in the Last Year: Not on file  Transportation Needs:   . Lack of Transportation (Medical): Not on file  . Lack of Transportation (Non-Medical): Not on file  Physical Activity:   . Days of Exercise per Week: Not on file  . Minutes of Exercise per Session: Not on file  Stress:   . Feeling of  Stress : Not on file  Social Connections:   . Frequency of Communication with Friends and Family: Not on file  . Frequency of Social Gatherings with Friends and Family: Not on file  . Attends Religious Services: Not on file  . Active Member of Clubs or Organizations: Not on file  . Attends Archivist Meetings: Not on file  . Marital Status: Not on file  Intimate Partner Violence:   . Fear of Current or Ex-Partner: Not on file  . Emotionally Abused: Not on file  . Physically Abused: Not on file  . Sexually Abused: Not on file      PHYSICAL EXAM  Vitals:   11/18/19 0912  BP: 127/80  Pulse: 66  Weight: 128 lb 9.6 oz (58.3 kg)  Height: 5\' 3"  (1.6 m)   Body mass index is 22.78 kg/m.   MMSE - Mini Mental State Exam 11/18/2019 05/17/2019 03/13/2017  Orientation to time 1 1 1   Orientation to Place 3 2 4   Registration 0 1 3  Attention/ Calculation 0 1 0  Recall 0 0 0  Language- name 2 objects 0 2 2  Language- repeat 0 1 1  Language- follow 3 step command 3 3 3   Language- follow 3 step command-comments - - -  Language- read & follow direction 0 1 1  Write a sentence 0 0 1  Copy design 0 0 1  Total score 7 12 17      Generalized: Well developed, in no acute distress   Neurological examination  Mentation: Alert  to person.follows all commands speech and language fluent Cranial nerve II-XII: Pupils were equal round reactive to light. Extraocular movements were full, visual field were full on confrontational test. Facial sensation and strength were normal. Uvula tongue midline. Head turning and shoulder shrug  were normal and symmetric. Motor: The motor testing reveals 5 over 5 strength of all 4 extremities. Good symmetric motor tone is noted throughout.  Sensory: Sensory testing is intact to soft touch on all 4 extremities. No evidence of extinction is noted.  Coordination: Cerebellar testing reveals good finger-nose-finger and heel-to-shin bilaterally.  Gait and  station: Gait is normal.   DIAGNOSTIC DATA (LABS, IMAGING, TESTING) - I reviewed patient records, labs, notes, testing and imaging myself where available.  Lab Results  Component Value Date   WBC 13.5 (H) 03/10/2019   HGB 11.2 (L) 03/10/2019   HCT 36.4 (L) 03/10/2019   MCV 91.0 03/10/2019   PLT 290 03/10/2019      Component Value Date/Time   NA 134 (L) 03/10/2019 1822   K 4.8 03/10/2019 1822  CL 102 03/10/2019 1822   CO2 24 03/10/2019 1822   GLUCOSE 130 (H) 03/10/2019 1822   BUN 15 03/10/2019 1822   CREATININE 0.93 03/10/2019 1822   CALCIUM 7.9 (L) 03/10/2019 1822   PROT 6.4 (L) 03/10/2019 1822   ALBUMIN 2.1 (L) 03/10/2019 1822   AST 27 03/10/2019 1822   ALT 27 03/10/2019 1822   ALKPHOS 44 03/10/2019 1822   BILITOT 0.5 03/10/2019 1822   GFRNONAA >60 03/10/2019 1822   GFRAA >60 03/10/2019 1822   Lab Results  Component Value Date   CHOL (H) 02/22/2010    248        ATP III CLASSIFICATION:  <200     mg/dL   Desirable  200-239  mg/dL   Borderline High  >=240    mg/dL   High          HDL 61 02/22/2010   LDLCALC (H) 02/22/2010    173        Total Cholesterol/HDL:CHD Risk Coronary Heart Disease Risk Table                     Men   Women  1/2 Average Risk   3.4   3.3  Average Risk       5.0   4.4  2 X Average Risk   9.6   7.1  3 X Average Risk  23.4   11.0        Use the calculated Patient Ratio above and the CHD Risk Table to determine the patient's CHD Risk.        ATP III CLASSIFICATION (LDL):  <100     mg/dL   Optimal  100-129  mg/dL   Near or Above                    Optimal  130-159  mg/dL   Borderline  160-189  mg/dL   High  >190     mg/dL   Very High   TRIG 68 03/10/2019   CHOLHDL 4.1 02/22/2010   Lab Results  Component Value Date   HGBA1C 5.6 03/07/2019   Lab Results  Component Value Date   [id number] 332 03/07/2019   No results found for: TSH    ASSESSMENT AND PLAN 74 y.o. year old male  has a past medical history of Aneurysm ([place]),  AVM (arteriovenous malformation), Dementia ([place]), Hydrocephalus ([place]), and Hypertension. here with:  1.  Alzheimer's dementia   MMSE 7 out of 30  No longer taking any medication  Advised to continue monitoring symptoms  Follow-up in 1 year or sooner if needed   I spent 25 minutes of face-to-face and non-face-to-face time with patient.  This included previsit chart review, lab review, study review, order entry, electronic health record documentation, patient education.  Ward Givens, MSN, NP-C 11/18/2019, 10:05 AM [facility] [address], [place], [place] [address] [phone]

## 2019-11-18 NOTE — Patient Instructions (Signed)
Your Plan:  Continue to monitor symptoms If your symptoms worsen or you develop new symptoms please let us know.   Thank you for coming to see us at [facility]. I hope we have been able to provide you high quality care today.  You may receive a patient satisfaction survey over the next few weeks. We would appreciate your feedback and comments so that we may continue to improve ourselves and the health of our patients.   

## 2020-06-07 ENCOUNTER — Telehealth: Payer: Self-pay | Admitting: *Deleted

## 2020-06-07 NOTE — Telephone Encounter (Signed)
Received [facility] med exam form. Completed. To MM/NP for review and signature.

## 2020-06-07 NOTE — Telephone Encounter (Signed)
Faxed with confirmation to [facility]. [phone], [phone].

## 2020-06-07 NOTE — Telephone Encounter (Signed)
completed

## 2020-06-16 NOTE — Telephone Encounter (Signed)
addended form completed and faxed to [facility].

## 2020-07-18 ENCOUNTER — Other Ambulatory Visit: Payer: Self-pay | Admitting: Internal Medicine

## 2020-07-19 LAB — SARS-COV-2 RNA,(COVID-19) QUALITATIVE NAAT: SARS CoV2 RNA: NOT DETECTED

## 2020-09-06 ENCOUNTER — Other Ambulatory Visit: Payer: Self-pay | Admitting: Internal Medicine

## 2020-09-12 LAB — SARS-COV-2 RNA,(COVID-19) QUALITATIVE NAAT: SARS CoV2 RNA: DETECTED — AB

## 2020-11-20 ENCOUNTER — Ambulatory Visit: Payer: Medicare Other | Admitting: Adult Health

## 2020-11-20 ENCOUNTER — Encounter: Payer: Self-pay | Admitting: *Deleted

## 2020-11-20 ENCOUNTER — Encounter: Payer: Self-pay | Admitting: Adult Health

## 2020-11-20 VITALS — BP 154/74 | HR 62 | Ht 63.0 in | Wt 137.0 lb

## 2020-11-20 DIAGNOSIS — G309 Alzheimer's disease, unspecified: Secondary | ICD-10-CM

## 2020-11-20 DIAGNOSIS — F02B18 Dementia in other diseases classified elsewhere, moderate, with other behavioral disturbance: Secondary | ICD-10-CM | POA: Diagnosis not present

## 2020-11-20 NOTE — Patient Instructions (Signed)
No longer taking any medication Advised to continue monitoring symptoms Follow-up in 6 months or sooner if needed

## 2020-11-20 NOTE — Progress Notes (Signed)
PATIENT: Jeffrey Huerta DOB: Feb 27, 1945  REASON FOR VISIT: follow up HISTORY FROM: patient   HISTORY OF PRESENT ILLNESS: Today 11/20/20 Jeffrey Huerta is a 75 y.o male with a history of Alzheimer's dementia. He returns today for a follow up. He is here today with his wife. She states that overall things are going well, he has had some progression in symptoms with periodic agitation but continues to be familiar with friends and family. He is performing a majority of his ADL's on his own with minimal assistance. He does not operate a Teacher, music or cook. Wife endorses that he is not sleeping well, she sates that he wakes in the middle of the night and walks around the house. He is not wandering outside of the house at this time. He continues to eat well. Patient is still going to [employer] day program. He takes the [employer] bus in the morning which he thoroughly enjoys. Wife picks him up in the afternoon. He is not taking any medications at this time. Wife denies any falls.  HISTORY  11/18/19:  Jeffrey Huerta is a 75 year old male with a history of Alzheimer's dementia.  He returns today for follow-up.  He is here today with his wife.  She states overall things have been relatively stable.  Reports that he has good days and bad days in regards to his memory.  He requires assistance with all ADLs.  Denies any trouble sleeping.  He is able to feed himself.  He does not drive.  He does go to [facility] day program.  He is no longer taking any of his medication.  He returns today for an evaluation. 05/17/19: Jeffrey Huerta is a 75 year old male with a history of Alzheimer's disease.  He returns today for follow-up.  He is currently not on any medication for his memory.  At the last visit his wife reported that she was having a hard time giving him medication.  She reports that he had Covid in January and was in the hospital for 1 week.  He is now doing better.  At home he requires assistance/supervision with most ADLs.   She manages all the finances.  He no longer operates a Teacher, music.  She reports that for the most part his mood is pleasant.  He often sings old hymns  to her.  Returns today for follow-upToday 11/18/19: Jeffrey Huerta is a 75 year old male with a history of Alzheimer's dementia.  He returns today for follow-up.  He is here today with his wife.  She states overall things have been relatively stable.  Reports that he has good days and bad days in regards to his memory.  He requires assistance with all ADLs.  Denies any trouble sleeping.  He is able to feed himself.  He does not drive.  He does go to [facility] day program.  He is no longer taking any of his medication.  He returns today for an evaluation.   HISTORY 05/17/19: Jeffrey Huerta is a 75 year old male with a history of Alzheimer's disease.  He returns today for follow-up.  He is currently not on any medication for his memory.  At the last visit his wife reported that she was having a hard time giving him medication.  She reports that he had Covid in January and was in the hospital for 1 week.  He is now doing better.  At home he requires assistance/supervision with most ADLs.  She manages all the finances.  He no longer operates  a motor vehicle.  She reports that for the most part his mood is pleasant.  He often sings old hymns  to her.  Returns today for follow-up  REVIEW OF SYSTEMS: Out of a complete 14 system review of symptoms, the patient complains only of the following symptoms, and all other reviewed systems are negative.  ALLERGIES: No Known Allergies  HOME MEDICATIONS: No outpatient medications prior to visit.   No facility-administered medications prior to visit.    PAST MEDICAL HISTORY: Past Medical History:  Diagnosis Date   Aneurysm ([place])    AVM (arteriovenous malformation)    Dementia ([place])    Hydrocephalus ([facility])    VP shunt in place   Hypertension     PAST SURGICAL HISTORY: Past Surgical History:  Procedure Laterality  Date   VENTRICULOPERITONEAL SHUNT     prior to 2008    FAMILY HISTORY: Family History  Problem Relation Age of Onset   Stroke Mother    Hypertension Mother     SOCIAL HISTORY: Social History   Socioeconomic History   Marital status: Married    Spouse name: Not on file   Number of children: 3   Years of education: 10th   Highest education level: Not on file  Occupational History   Occupation: Retired  Tobacco Use   Smoking status: Never   Smokeless tobacco: Never  Vaping Use   Vaping Use: Never used  Substance and Sexual Activity   Alcohol use: No    Alcohol/week: 0.0 standard drinks   Drug use: No   Sexual activity: Not on file  Other Topics Concern   Not on file  Social History Narrative   Lives at home with wife.   Right-handed.   No caffeine use.   Social Determinants of Health   Financial Resource Strain: Not on file  Food Insecurity: Not on file  Transportation Needs: Not on file  Physical Activity: Not on file  Stress: Not on file  Social Connections: Not on file  Intimate Partner Violence: Not on file    PHYSICAL EXAM  Vitals:   11/20/20 0841  BP: (!) 154/74  Pulse: 62  Weight: 137 lb (62.1 kg)  Height: 5\' 3"  (1.6 m)   Body mass index is 24.27 kg/m.  Generalized: Well developed, in no acute distress   Neurological examination  Mentation: Alert oriented to person, place, Follows all commands speech and language fluent Cranial nerve II-XII: Pupils were equal round reactive to light. Extraocular movements were full, visual field were full on confrontational test. Facial sensation and strength were normal. Uvula tongue midline. Head turning and shoulder shrug  were normal and symmetric. Motor: The motor testing reveals 5 over 5 strength of all 4 extremities. Good symmetric motor tone is noted throughout.  Sensory: Sensory testing is intact to soft touch on all 4 extremities. No evidence of extinction is noted.  Coordination: Cerebellar testing  reveals good finger-nose-finger and heel-to-shin bilaterally.  Gait and station: Gait is normal. Tandem gait is normal. Romberg is negative. No drift is seen.  Reflexes: Deep tendon reflexes are symmetric and normal bilaterally.   DIAGNOSTIC DATA (LABS, IMAGING, TESTING) - I reviewed patient records, labs, notes, testing and imaging myself where available.  Lab Results  Component Value Date   WBC 13.5 (H) 03/10/2019   HGB 11.2 (L) 03/10/2019   HCT 36.4 (L) 03/10/2019   MCV 91.0 03/10/2019   PLT 290 03/10/2019      Component Value Date/Time   NA 134 (L)  03/10/2019 1822   K 4.8 03/10/2019 1822   CL 102 03/10/2019 1822   CO2 24 03/10/2019 1822   GLUCOSE 130 (H) 03/10/2019 1822   BUN 15 03/10/2019 1822   CREATININE 0.93 03/10/2019 1822   CALCIUM 7.9 (L) 03/10/2019 1822   PROT 6.4 (L) 03/10/2019 1822   ALBUMIN 2.1 (L) 03/10/2019 1822   AST 27 03/10/2019 1822   ALT 27 03/10/2019 1822   ALKPHOS 44 03/10/2019 1822   BILITOT 0.5 03/10/2019 1822   GFRNONAA >60 03/10/2019 1822   GFRAA >60 03/10/2019 1822   Lab Results  Component Value Date   CHOL (H) 02/22/2010    248        ATP III CLASSIFICATION:  <200     mg/dL   Desirable  200-239  mg/dL   Borderline High  >=240    mg/dL   High          HDL 61 02/22/2010   LDLCALC (H) 02/22/2010    173        Total Cholesterol/HDL:CHD Risk Coronary Heart Disease Risk Table                     Men   Women  1/2 Average Risk   3.4   3.3  Average Risk       5.0   4.4  2 X Average Risk   9.6   7.1  3 X Average Risk  23.4   11.0        Use the calculated Patient Ratio above and the CHD Risk Table to determine the patient's CHD Risk.        ATP III CLASSIFICATION (LDL):  <100     mg/dL   Optimal  100-129  mg/dL   Near or Above                    Optimal  130-159  mg/dL   Borderline  160-189  mg/dL   High  >190     mg/dL   Very High   TRIG 68 03/10/2019   CHOLHDL 4.1 02/22/2010   Lab Results  Component Value Date   HGBA1C 5.6  03/07/2019   Lab Results  Component Value Date   XVQMGQQP61 950 03/07/2019   No results found for: TSH    ASSESSMENT AND PLAN 75 y.o. year old male  has a past medical history of Aneurysm ([place]), AVM (arteriovenous malformation), Dementia ([place]), Hydrocephalus ([place]), and Hypertension. here with   1.  Alzheimer's dementia   No longer taking any medication Advised to continue monitoring symptoms Follow-up in 6 months or sooner if needed     Ward Givens, MSN, NP-C 11/20/2020, 8:41 AM [facility] [address] [place], [place] [address] [phone]

## 2021-03-09 ENCOUNTER — Other Ambulatory Visit: Payer: Self-pay | Admitting: Internal Medicine

## 2021-03-10 LAB — COMPLETE METABOLIC PANEL WITH GFR
AG Ratio: 0.7 (calc) — ABNORMAL LOW (ref 1.0–2.5)
ALT: 8 U/L — ABNORMAL LOW (ref 9–46)
AST: 14 U/L (ref 10–35)
Albumin: 3.5 g/dL — ABNORMAL LOW (ref 3.6–5.1)
Alkaline phosphatase (APISO): 59 U/L (ref 35–144)
BUN: 21 mg/dL (ref 7–25)
CO2: 25 mmol/L (ref 20–32)
Calcium: 8.6 mg/dL (ref 8.6–10.3)
Chloride: 105 mmol/L (ref 98–110)
Creat: 1.19 mg/dL (ref 0.70–1.28)
Globulin: 5.2 g/dL (calc) — ABNORMAL HIGH (ref 1.9–3.7)
Glucose, Bld: 126 mg/dL — ABNORMAL HIGH (ref 65–99)
Potassium: 4.2 mmol/L (ref 3.5–5.3)
Sodium: 138 mmol/L (ref 135–146)
Total Bilirubin: 0.4 mg/dL (ref 0.2–1.2)
Total Protein: 8.7 g/dL — ABNORMAL HIGH (ref 6.1–8.1)
eGFR: 63 mL/min/{1.73_m2} (ref >=60)

## 2021-03-10 LAB — CBC
HCT: 34.2 % — ABNORMAL LOW (ref 38.5–50.0)
Hemoglobin: 10.8 g/dL — ABNORMAL LOW (ref 13.2–17.1)
MCH: 29.3 pg (ref 27.0–33.0)
MCHC: 31.6 g/dL — ABNORMAL LOW (ref 32.0–36.0)
MCV: 92.7 fL (ref 80.0–100.0)
MPV: 10.9 fL (ref 7.5–12.5)
Platelets: 77 10*3/uL — ABNORMAL LOW (ref 140–400)
RBC: 3.69 10*6/uL — ABNORMAL LOW (ref 4.20–5.80)
RDW: 14.3 % (ref 11.0–15.0)
WBC: 14.1 10*3/uL — ABNORMAL HIGH (ref 3.8–10.8)

## 2021-03-10 LAB — LIPID PANEL
Cholesterol: 156 mg/dL (ref <200)
HDL: 40 mg/dL (ref >=40)
LDL Cholesterol (Calc): 98 mg/dL (calc)
Non-HDL Cholesterol (Calc): 116 mg/dL (calc) (ref <130)
Total CHOL/HDL Ratio: 3.9 (calc) (ref <5.0)
Triglycerides: 88 mg/dL (ref <150)

## 2021-03-10 LAB — TSH: TSH: 1.38 mIU/L (ref 0.40–4.50)

## 2021-06-04 ENCOUNTER — Ambulatory Visit: Payer: Medicare Other | Admitting: Adult Health

## 2021-06-04 ENCOUNTER — Encounter: Payer: Self-pay | Admitting: Adult Health

## 2021-06-04 VITALS — BP 143/81 | HR 72 | Ht 63.0 in | Wt 128.4 lb

## 2021-06-04 DIAGNOSIS — F02B18 Dementia in other diseases classified elsewhere, moderate, with other behavioral disturbance: Secondary | ICD-10-CM | POA: Diagnosis not present

## 2021-06-04 DIAGNOSIS — G309 Alzheimer's disease, unspecified: Secondary | ICD-10-CM | POA: Diagnosis not present

## 2021-06-04 NOTE — Progress Notes (Signed)
? ? ?PATIENT: Jeffrey Huerta ?DOB: 1946-02-12 ? ?REASON FOR VISIT: follow up ?HISTORY FROM: patient ? ?Chief Complaint  ?Patient presents with  ? Follow-up  ?  Rm 8,  [name] wife.  Pt goes to [facility] M-F 8-5, tired a lot, wobbles-ambulating, grunt/mumbles.  Agitated.   ? ? ? ? ?HISTORY OF PRESENT ILLNESS: ?Today 06/04/21: ? ?[name] is a 76 year old male with a history of alzhemiers dementia. Reprots today for follow up. Wife reports some decline. Reports that he hums or groans a lot. Reports that [facility] has told her that he wobbles some with ambulation. Does not use an assistive device typically. Reports that he is tired all the time. Sleeps during the day when at home. Attends [facility] during the week. Wife reports that he is engaged in the activities there. Needs some assistance with ADLs and prompting. Has cameras and locks on the doors incase he gets up at night. Not on any medication. ? ? ?11/20/20:[name] is a 76 y.o male with a history of Alzheimer's dementia. He returns today for a follow up. He is here today with his wife. She states that overall things are going well, he has had some progression in symptoms with periodic agitation but continues to be familiar with friends and family. He is performing a majority of his ADL's on his own with minimal assistance. He does not operate a Teacher, music or cook. Wife endorses that he is not sleeping well, she sates that he wakes in the middle of the night and walks around the house. He is not wandering outside of the house at this time. He continues to eat well. Patient is still going to [employer] day program. He takes the [employer] bus in the morning which he thoroughly enjoys. Wife picks him up in the afternoon. He is not taking any medications at this time. Wife denies any falls. ? ?HISTORY  ?11/18/19:  ?[name] is a 76 year old male with a history of Alzheimer's dementia.  He returns today for follow-up.  He is here today with his wife.  She states  overall things have been relatively stable.  Reports that he has good days and bad days in regards to his memory.  He requires assistance with all ADLs.  Denies any trouble sleeping.  He is able to feed himself.  He does not drive.  He does go to [facility] day program.  He is no longer taking any of his medication.  He returns today for an evaluation. ?05/17/19: ?[name] is a 76 year old male with a history of Alzheimer's disease.  He returns today for follow-up.  He is currently not on any medication for his memory.  At the last visit his wife reported that she was having a hard time giving him medication.  She reports that he had Covid in January and was in the hospital for 1 week.  He is now doing better.  At home he requires assistance/supervision with most ADLs.  She manages all the finances.  He no longer operates a Teacher, music.  She reports that for the most part his mood is pleasant.  He often sings old hymns  to her.  Returns today for follow-upToday 11/18/19: ?[name] is a 76 year old male with a history of Alzheimer's dementia.  He returns today for follow-up.  He is here today with his wife.  She states overall things have been relatively stable.  Reports that he has good days and bad days in regards to his memory.  He requires assistance with all ADLs.  Denies any trouble sleeping.  He is able to feed himself.  He does not drive.  He does go to [facility] day [facility].  He is no longer taking any of his medication.  He returns today for an evaluation. ?  ?HISTORY 05/17/19: ?[name] is a 76 year old male with a history of Alzheimer's disease.  He returns today for follow-up.  He is currently not on any medication for his memory.  At the last visit his wife reported that she was having a hard time giving him medication.  She reports that he had Covid in January and was in the hospital for 1 week.  He is now doing better.  At home he requires assistance/supervision with most ADLs.  She manages  all the finances.  He no longer operates a Teacher, music.  She reports that for the most part his mood is pleasant.  He often sings old hymns  to her.  Returns today for follow-up ? ?REVIEW OF SYSTEMS: Out of a complete 14 system review of symptoms, the patient complains only of the following symptoms, and all other reviewed systems are negative. ? ?ALLERGIES: ?No Known Allergies ? ?HOME MEDICATIONS: ?No outpatient medications prior to visit.  ? ?No facility-administered medications prior to visit.  ? ? ?PAST MEDICAL HISTORY: ?Past Medical History:  ?Diagnosis Date  ? Aneurysm ([place])   ? AVM (arteriovenous malformation)   ? Dementia ([place])   ? Hydrocephalus ([place])   ? VP shunt in place  ? Hypertension   ? ? ?PAST SURGICAL HISTORY: ?Past Surgical History:  ?Procedure Laterality Date  ? VENTRICULOPERITONEAL SHUNT    ? prior to 2008  ? ? ?FAMILY HISTORY: ?Family History  ?Problem Relation Age of Onset  ? Stroke Mother   ? Hypertension Mother   ? ? ?SOCIAL HISTORY: ?Social History  ? ?Socioeconomic History  ? Marital status: Married  ?  Spouse name: Not on file  ? Number of children: 3  ? Years of education: 10th  ? Highest education level: Not on file  ?Occupational History  ? Occupation: Retired  ?Tobacco Use  ? Smoking status: Never  ? Smokeless tobacco: Never  ?Vaping Use  ? Vaping Use: Never used  ?Substance and Sexual Activity  ? Alcohol use: No  ?  Alcohol/week: 0.0 standard drinks  ? Drug use: No  ? Sexual activity: Not on file  ?Other Topics Concern  ? Not on file  ?Social History Narrative  ? Lives at home with wife.  ? Right-handed.  ? No caffeine use.  ? ?Social Determinants of Health  ? ?Financial Resource Strain: Not on file  ?Food Insecurity: Not on file  ?Transportation Needs: Not on file  ?Physical Activity: Not on file  ?Stress: Not on file  ?Social Connections: Not on file  ?Intimate Partner Violence: Not on file  ? ? ?PHYSICAL EXAM ? ?Vitals:  ? 06/04/21 0908  ?BP: (!) 143/81  ?Pulse: 72  ?Weight: 128  lb 6.4 oz (58.2 kg)  ?Height: '5\' 3"'$  (1.6 m)  ? ?Body mass index is 22.75 kg/m?. ? ?  11/18/2019  ?  9:15 AM 05/17/2019  ?  1:02 PM 03/13/2017  ?  8:36 AM  ?MMSE - Mini Mental State Exam  ?Orientation to time '1 1 1  '$ ?Orientation to Place '3 2 4  '$ ?Registration 0 1 3  ?Attention/ Calculation 0 1 0  ?Recall 0 0 0  ?Language- name 2 objects 0 2 2  ?  Language- repeat 0 1 1  ?Language- follow 3 step command '3 3 3  '$ ?Language- read & follow direction 0 1 1  ?Write a sentence 0 0 1  ?Copy design 0 0 1  ?Total score '7 12 17  '$ ? ? ? ?Generalized: Well developed, in no acute distress  ? ?Neurological examination  ?Mentation: Alert. Follows commands  ?Cranial nerve II-XII: Pupils were equal round reactive to light. Extraocular movements were full, visual field were full on confrontational test. Facial sensation and strength were normal. Head turning and shoulder shrug  were normal and symmetric. ?Motor: The motor testing reveals 5 over 5 strength of all 4 extremities. Good symmetric motor tone is noted throughout.  ?Sensory: Sensory testing is intact to soft touch on all 4 extremities. No evidence of extinction is noted.  ?Coordination: Cerebellar testing reveals some difficulty with understanding how to preform finger-nose-finger and heel-to-shin bilaterally.  ?Gait and station: Gait is normal. Tandem gait not attempted. ? ? ?DIAGNOSTIC DATA (LABS, IMAGING, TESTING) ?- I reviewed patient records, labs, notes, testing and imaging myself where available. ? ?Lab Results  ?Component Value Date  ? WBC 14.1 (H) 03/09/2021  ? HGB 10.8 (L) 03/09/2021  ? HCT 34.2 (L) 03/09/2021  ? MCV 92.7 03/09/2021  ? PLT 77 (L) 03/09/2021  ? ?   ?Component Value Date/Time  ? NA 138 03/09/2021 0000  ? K 4.2 03/09/2021 0000  ? CL 105 03/09/2021 0000  ? CO2 25 03/09/2021 0000  ? GLUCOSE 126 (H) 03/09/2021 0000  ? BUN 21 03/09/2021 0000  ? CREATININE 1.19 03/09/2021 0000  ? CALCIUM 8.6 03/09/2021 0000  ? PROT 8.7 (H) 03/09/2021 0000  ? ALBUMIN 2.1 (L)  03/10/2019 1822  ? AST 14 03/09/2021 0000  ? ALT 8 (L) 03/09/2021 0000  ? ALKPHOS 44 03/10/2019 1822  ? BILITOT 0.4 03/09/2021 0000  ? GFRNONAA >60 03/10/2019 1822  ? GFRAA >60 03/10/2019 1822  ? ?Lab Results  ?[place]

## 2021-08-15 ENCOUNTER — Encounter (HOSPITAL_COMMUNITY): Payer: Self-pay | Admitting: Pharmacy Technician

## 2021-08-15 ENCOUNTER — Emergency Department (HOSPITAL_COMMUNITY): Payer: Medicare Other

## 2021-08-15 ENCOUNTER — Emergency Department (HOSPITAL_COMMUNITY)
Admission: EM | Admit: 2021-08-15 | Discharge: 2021-08-15 | Disposition: A | Discharge status: Home / Self Care | Payer: Medicare Other | Attending: Emergency Medicine | Admitting: Emergency Medicine

## 2021-08-15 DIAGNOSIS — G309 Alzheimer's disease, unspecified: Secondary | ICD-10-CM | POA: Diagnosis not present

## 2021-08-15 DIAGNOSIS — R5383 Other fatigue: Secondary | ICD-10-CM | POA: Diagnosis not present

## 2021-08-15 DIAGNOSIS — R404 Transient alteration of awareness: Secondary | ICD-10-CM | POA: Insufficient documentation

## 2021-08-15 DIAGNOSIS — R4182 Altered mental status, unspecified: Secondary | ICD-10-CM | POA: Diagnosis present

## 2021-08-15 DIAGNOSIS — I10 Essential (primary) hypertension: Secondary | ICD-10-CM | POA: Insufficient documentation

## 2021-08-15 LAB — CBC
HCT: 32.4 % — ABNORMAL LOW (ref 39.0–52.0)
Hemoglobin: 9.9 g/dL — ABNORMAL LOW (ref 13.0–17.0)
MCH: 30.3 pg (ref 26.0–34.0)
MCHC: 30.6 g/dL (ref 30.0–36.0)
MCV: 99.1 fL (ref 80.0–100.0)
Platelets: 119 10*3/uL — ABNORMAL LOW (ref 150–400)
RBC: 3.27 MIL/uL — ABNORMAL LOW (ref 4.22–5.81)
RDW: 14.9 % (ref 11.5–15.5)
WBC: 17.4 10*3/uL — ABNORMAL HIGH (ref 4.0–10.5)
nRBC: 0 % (ref 0.0–0.2)

## 2021-08-15 LAB — COMPREHENSIVE METABOLIC PANEL
ALT: 12 U/L (ref 0–44)
AST: 17 U/L (ref 15–41)
Albumin: 2.6 g/dL — ABNORMAL LOW (ref 3.5–5.0)
Alkaline Phosphatase: 53 U/L (ref 38–126)
Anion gap: 4 — ABNORMAL LOW (ref 5–15)
BUN: 13 mg/dL (ref 8–23)
CO2: 26 mmol/L (ref 22–32)
Calcium: 8.1 mg/dL — ABNORMAL LOW (ref 8.9–10.3)
Chloride: 104 mmol/L (ref 98–111)
Creatinine, Ser: 1.04 mg/dL (ref 0.61–1.24)
GFR, Estimated: >60 mL/min (ref >60)
Glucose, Bld: 109 mg/dL — ABNORMAL HIGH (ref 70–99)
Potassium: 4.5 mmol/L (ref 3.5–5.1)
Sodium: 134 mmol/L — ABNORMAL LOW (ref 135–145)
Total Bilirubin: 0.1 mg/dL — ABNORMAL LOW (ref 0.3–1.2)
Total Protein: 8.4 g/dL — ABNORMAL HIGH (ref 6.5–8.1)

## 2021-08-15 LAB — URINALYSIS, ROUTINE W REFLEX MICROSCOPIC
Bacteria, UA: NONE SEEN
Bilirubin Urine: NEGATIVE
Glucose, UA: NEGATIVE mg/dL
Ketones, ur: NEGATIVE mg/dL
Leukocytes,Ua: NEGATIVE
Nitrite: NEGATIVE
Protein, ur: NEGATIVE mg/dL
Specific Gravity, Urine: 1.024 (ref 1.005–1.030)
pH: 5 (ref 5.0–8.0)

## 2021-08-15 LAB — LACTIC ACID, PLASMA: Lactic Acid, Venous: 1.3 mmol/L (ref 0.5–1.9)

## 2021-08-15 LAB — TSH: TSH: 0.805 u[IU]/mL (ref 0.350–4.500)

## 2021-08-15 LAB — AMMONIA: Ammonia: 19 umol/L (ref 9–35)

## 2021-08-15 MED ORDER — NITROGLYCERIN 0.4 MG/SPRAY TL SOLN
Status: AC
  Filled 2021-08-15: qty 4.9

## 2021-08-15 MED ORDER — SODIUM CHLORIDE 0.9 % IV BOLUS
500.0000 mL | Freq: Once | INTRAVENOUS | Status: AC
  Administered 2021-08-15: 500 mL via INTRAVENOUS

## 2021-08-15 NOTE — Progress Notes (Signed)
Pt has a VP shunt and a h/o aneurysm repair.  We will need information on both of these surgeries before we are able to proceed with MRI scan.  Please call MRI [phone] with any questions or updates.

## 2021-08-15 NOTE — ED Triage Notes (Signed)
Pt bib family member who states pt has not been acting like himself since yesterday. Family unable to elaborate. Pt with hx dementia. Yesterday pt was complaining of chest pain. Family also reports pt with unsteady balance since yesterday.

## 2021-08-15 NOTE — ED Notes (Signed)
Walked patient to the bathroom patient did well 

## 2021-08-15 NOTE — Discharge Instructions (Signed)
His work-up today was overall reassuring.  The CT head did not show evidence of problem with the shunt or evidence of acute stroke.  Labs did not show evidence of urinary tract infection and x-ray did not show pneumonia.  His labs were similar to prior with no critical abnormalities seen.  As he was doing better and back to baseline and able to ambulate, we feel he is safe for discharge home.  We had a shared decision-making conversation offering MRI however due to his hardware in his head it was potentially going to take some time until that could be done.  As he is back to baseline and appearing well, we feel he is safe for discharge home to follow-up with outpatient PCP and neurology teams.  If any symptoms change or worsen acutely, please return to the nearest emergency department.

## 2021-08-15 NOTE — ED Provider Notes (Signed)
[facility] EMERGENCY DEPARTMENT Provider Note   CSN: [id number] Arrival date & time: 08/15/21  0844     History  Chief Complaint  Patient presents with   Altered Mental Status    Danial Hlavac is a 76 y.o. male.  The history is provided by the patient, a relative and medical records. No language interpreter was used.  Altered Mental Status Presenting symptoms: confusion   Presenting symptoms comment:  Fatigue Severity:  Moderate Most recent episode:  Yesterday Episode history:  Multiple Duration:  2 days Timing:  Intermittent Progression:  Waxing and waning Chronicity:  Recurrent Context: dementia   Associated symptoms: no abdominal pain, no agitation, no fever, no headaches, no light-headedness, no nausea, no palpitations, no rash, no slurred speech, no vomiting and no weakness        Home Medications Prior to Admission medications   Not on File      Allergies    Patient has no known allergies.    Review of Systems   Review of Systems  Constitutional:  Positive for fatigue. Negative for chills and fever.  HENT:  Negative for congestion.   Respiratory:  Positive for cough. Negative for chest tightness, shortness of breath and wheezing.   Cardiovascular:  Negative for chest pain and palpitations.  Gastrointestinal:  Negative for abdominal pain, constipation, diarrhea, nausea and vomiting.  Genitourinary:  Positive for decreased urine volume. Negative for dysuria.  Musculoskeletal:  Negative for back pain, neck pain and neck stiffness.  Skin:  Negative for rash and wound.  Neurological:  Negative for dizziness, syncope, speech difficulty, weakness, light-headedness and headaches.  Psychiatric/Behavioral:  Positive for confusion. Negative for agitation.   All other systems reviewed and are negative.   Physical Exam Updated Vital Signs BP 109/71 (BP Location: Right Arm)   Pulse 72   Temp 98.2 F (36.8 C) (Oral)   Resp 18   SpO2 100%   Physical Exam Vitals and nursing note reviewed.  Constitutional:      General: He is not in acute distress.    Appearance: He is well-developed. He is not ill-appearing, toxic-appearing or diaphoretic.  HENT:     Head: Normocephalic and atraumatic.     Nose: No congestion or rhinorrhea.     Mouth/Throat:     Mouth: Mucous membranes are moist.     Pharynx: No oropharyngeal exudate or posterior oropharyngeal erythema.  Eyes:     Extraocular Movements: Extraocular movements intact.     Conjunctiva/sclera: Conjunctivae normal.     Pupils: Pupils are equal, round, and reactive to light.  Cardiovascular:     Rate and Rhythm: Normal rate and regular rhythm.     Heart sounds: No murmur heard. Pulmonary:     Effort: Pulmonary effort is normal. No respiratory distress.     Breath sounds: Normal breath sounds. No wheezing, rhonchi or rales.  Chest:     Chest wall: No tenderness.  Abdominal:     Palpations: Abdomen is soft.     Tenderness: There is no abdominal tenderness. There is no guarding or rebound.  Musculoskeletal:        General: No swelling or tenderness.     Cervical back: Neck supple. No tenderness.     Right lower leg: No edema.     Left lower leg: No edema.  Skin:    General: Skin is warm and dry.     Capillary Refill: Capillary refill takes less than 2 seconds.     Findings: No  erythema or rash.  Neurological:     Mental Status: He is alert.     Sensory: No sensory deficit.     Motor: No weakness.  Psychiatric:        Mood and Affect: Mood normal.     ED Results / Procedures / Treatments   Labs (all labs ordered are listed, but only abnormal results are displayed) Labs Reviewed  COMPREHENSIVE METABOLIC PANEL - Abnormal; Notable for the following components:      Result Value   Sodium 134 (*)    Glucose, Bld 109 (*)    Calcium 8.1 (*)    Total Protein 8.4 (*)    Albumin 2.6 (*)    Total Bilirubin 0.1 (*)    Anion gap 4 (*)    All other components within  normal limits  CBC - Abnormal; Notable for the following components:   WBC 17.4 (*)    RBC 3.27 (*)    Hemoglobin 9.9 (*)    HCT 32.4 (*)    Platelets 119 (*)    All other components within normal limits  URINALYSIS, ROUTINE W REFLEX MICROSCOPIC - Abnormal; Notable for the following components:   Hgb urine dipstick SMALL (*)    All other components within normal limits  URINE CULTURE  CULTURE, BLOOD (ROUTINE X 2)  CULTURE, BLOOD (ROUTINE X 2)  SARS CORONAVIRUS 2 BY RT PCR  LACTIC ACID, PLASMA  AMMONIA  TSH  LACTIC ACID, PLASMA  CBG MONITORING, ED    EKG EKG Interpretation  Date/Time:  Wednesday August 15 2021 08:55:50 EDT Ventricular Rate:  75 PR Interval:  150 QRS Duration: 86 QT Interval:  424 QTC Calculation: 473 R Axis:   37 Text Interpretation: Normal sinus rhythm Cannot rule out Anterior infarct , age undetermined Abnormal ECG When compared with ECG of 10-Mar-2019 19:38, PREVIOUS ECG IS PRESENT when compared to prior, similar appearance. No sTEMI Confirmed by Antony Blackbird [phone]) on 08/15/2021 9:25:43 AM  Radiology CT HEAD WO CONTRAST (5MM)  Result Date: 08/15/2021 CLINICAL DATA:  Mental status change, persistent or worsening EXAM: CT HEAD WITHOUT CONTRAST TECHNIQUE: Contiguous axial images were obtained from the base of the skull through the vertex without intravenous contrast. RADIATION DOSE REDUCTION: This exam was performed according to the departmental dose-optimization program which includes automated exposure control, adjustment of the mA and/or kV according to patient size and/or use of iterative reconstruction technique. COMPARISON:  Head CT 03/10/2019 FINDINGS: Brain: Stable position of right approach ventriculostomy catheter terminating in the right lateral ventricle. The ventricular system is unchanged in size. Unchanged hypoattenuation along the catheter tract without evidence of tract hemorrhage. Unchanged right frontal and left cerebellar  encephalomalacia.Unchanged mild prominence of the extra-axial spaces proportional to degree of cerebral atrophy. No acute extra-axial collection.No new loss of gray-white matter differentiation. Vascular: Vascular calcifications.  No hyperdense vessel. Skull: Prior suboccipital craniotomy. Negative for acute skull fracture. Sinuses/Orbits: Opacification of the left frontal sinus and left anterior ethmoid air cells. Frothy material in the maxillary sinuses and left sphenoid sinus. Right mastoid effusion. The sinus disease has progressed since the prior exam. Orbits are unremarkable. Other: None. IMPRESSION: No acute intracranial abnormality. Stable postoperative and chronic ischemic changes. Stable right-sided ventriculostomy catheter and size of the ventricular system. Electronically Signed   By: Maurine Simmering M.D.   On: 08/15/2021 11:09   DG Cervical Spine 1 View  Result Date: 08/15/2021 CLINICAL DATA:  Altered mental status.  Assess VP shunt EXAM: SHUNT SERIES (AP & LAT  SKULL, AP NECK, AP CHEST, AP ABDOMEN) COMPARISON:  CT 03/10/2019 FINDINGS: Right parietal approach ventriculostomy catheter is again seen with distal tip terminating just to the right of midline. Catheter descends the right neck and right chest wall and coils within the pelvis before terminating in the lower right abdomen. No catheter kinking or discontinuity. Postsurgical changes noted from prior suboccipital craniotomy. Stable heart size. Lungs are clear. No pneumothorax. Bowel gas pattern is nonobstructive. Moderate volume of stool within the colon and rectum. IMPRESSION: Right parietal approach ventriculostomy catheter terminates in the lower right abdomen. No catheter kinking or discontinuity. Electronically Signed   By: Davina Poke D.O.   On: 08/15/2021 11:01   DG Chest 1 View  Result Date: 08/15/2021 CLINICAL DATA:  Altered mental status.  Assess VP shunt EXAM: SHUNT SERIES (AP & LAT SKULL, AP NECK, AP CHEST, AP ABDOMEN)  COMPARISON:  CT 03/10/2019 FINDINGS: Right parietal approach ventriculostomy catheter is again seen with distal tip terminating just to the right of midline. Catheter descends the right neck and right chest wall and coils within the pelvis before terminating in the lower right abdomen. No catheter kinking or discontinuity. Postsurgical changes noted from prior suboccipital craniotomy. Stable heart size. Lungs are clear. No pneumothorax. Bowel gas pattern is nonobstructive. Moderate volume of stool within the colon and rectum. IMPRESSION: Right parietal approach ventriculostomy catheter terminates in the lower right abdomen. No catheter kinking or discontinuity. Electronically Signed   By: Davina Poke D.O.   On: 08/15/2021 11:01   DG Abd 1 View  Result Date: 08/15/2021 CLINICAL DATA:  Altered mental status.  Assess VP shunt EXAM: SHUNT SERIES (AP & LAT SKULL, AP NECK, AP CHEST, AP ABDOMEN) COMPARISON:  CT 03/10/2019 FINDINGS: Right parietal approach ventriculostomy catheter is again seen with distal tip terminating just to the right of midline. Catheter descends the right neck and right chest wall and coils within the pelvis before terminating in the lower right abdomen. No catheter kinking or discontinuity. Postsurgical changes noted from prior suboccipital craniotomy. Stable heart size. Lungs are clear. No pneumothorax. Bowel gas pattern is nonobstructive. Moderate volume of stool within the colon and rectum. IMPRESSION: Right parietal approach ventriculostomy catheter terminates in the lower right abdomen. No catheter kinking or discontinuity. Electronically Signed   By: Davina Poke D.O.   On: 08/15/2021 11:01   DG Skull 1-3 Views  Result Date: 08/15/2021 CLINICAL DATA:  Altered mental status.  Assess VP shunt EXAM: SHUNT SERIES (AP & LAT SKULL, AP NECK, AP CHEST, AP ABDOMEN) COMPARISON:  CT 03/10/2019 FINDINGS: Right parietal approach ventriculostomy catheter is again seen with distal tip  terminating just to the right of midline. Catheter descends the right neck and right chest wall and coils within the pelvis before terminating in the lower right abdomen. No catheter kinking or discontinuity. Postsurgical changes noted from prior suboccipital craniotomy. Stable heart size. Lungs are clear. No pneumothorax. Bowel gas pattern is nonobstructive. Moderate volume of stool within the colon and rectum. IMPRESSION: Right parietal approach ventriculostomy catheter terminates in the lower right abdomen. No catheter kinking or discontinuity. Electronically Signed   By: Davina Poke D.O.   On: 08/15/2021 11:01    Procedures Procedures    Medications Ordered in ED Medications  sodium chloride 0.9 % bolus 500 mL (500 mLs Intravenous Bolus 08/15/21 1329)    ED Course/ Medical Decision Making/ A&P  Medical Decision Making Amount and/or Complexity of Data Reviewed Labs: ordered. Radiology: ordered.    Mccormick Macon is a 76 y.o. male with a past medical history significant for hydrocephalus status post VP shunt, Alzheimer's dementia, and hypertension who presents with family for altered mental status.  According to family, since yesterday, has patient has not been acting right.  She reports he has had intermittent difficulty with ambulation with unsteadiness and is also had some worsened fatigue and somnolence.  She reports that he has had some cough the last few days but has not complained of any fevers or chills.  He has had decreased urination the normal which is different.  She reports he has had no falls or injuries but when they took him to his memory care today, they felt that something was off and he needed to come get evaluated.  She says that this morning he was eating and drinking and was intermittently ambulating well.  He has not had any nausea or vomiting and he is denying any pain currently.  He specifically denies any headache, neck pain, chest pain, or  abdominal pain.  He denies any shortness of breath.  He denies any focal numbness, tingling, or weakness of extremities.  He just feels tired.  Family reports no recent medication changes and no other acute symptoms.  She says that yesterday he was complaining of intermittent discomfort all over his body but that has resolved.  On exam, lungs were clear and chest was nontender.  He is not hypoxic.  Abdomen nontender.  Patient was able to move all extremities.  He was slow with finger-nose-finger testing bilaterally but was not overtly ataxic.  Pupils are symmetric and reactive with normal extraocular movements.  No tenderness on my exam.  No evidence of acute trauma.  Given his VP shunt and the somnolence and difficulty with ambulation, I do feel need to rule out recurrent hydrocephalus.  We will get CT head and a shunt series to look for discontinuity.  Also take a look at chest x-ray given the reported cough and the intermittent chills.  We will check urinalysis given his urine changes and will get other lab testing to look for causes of altered mental status.  Anticipate reassessment after work-up to determine disposition.  CT head and VP shunt series did not show acute abnormalities.  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  Patient was doing better in the room when I went back in to assess him but family still concerned about how transiently he was having the difficulties.  His other work-up did not show evidence of UTI or pneumonia and his labs are similar to prior.  We agreed together to give him some fluids due to evidence of dehydration with dry mouth on exam and they did want an MRI to rule out acute stroke.  If MRI is reassuring and he is feeling better, anticipate discharge and family agrees.  Anticipate discharge after MRI.  Family did not want to wait for the MRI as patient was back to baseline and able to ambulate well.  He will follow-up with his PCP.  Family agrees with plan of care and  patient discharged in good condition with resolution of confusion and otherwise reassuring work-up in the emergency department.         Final Clinical Impression(s) / ED Diagnoses Final diagnoses:  Transient alteration of awareness    Rx / DC Orders ED Discharge Orders     None  Clinical Impression: 1. Transient alteration of awareness     Disposition: Discharge  Condition: Good  I have discussed the results, Dx and Tx plan with the pt(& family if present). He/she/they expressed understanding and agree(s) with the plan. Discharge instructions discussed at great length. Strict return precautions discussed and pt &/or family have verbalized understanding of the instructions. No further questions at time of discharge.    New Prescriptions   No medications on file    Follow Up: Nolene Ebbs, MD [place] [address] [place] [address] [id number] mc [place]        Lyndzee Kliebert, Gwenyth Allegra, MD 08/15/21 1620

## 2021-08-16 LAB — URINE CULTURE

## 2021-08-20 LAB — CULTURE, BLOOD (ROUTINE X 2)
Culture: NO GROWTH
Culture: NO GROWTH
Special Requests: ADEQUATE
Special Requests: ADEQUATE

## 2021-08-29 ENCOUNTER — Encounter (HOSPITAL_COMMUNITY): Payer: Self-pay

## 2021-08-29 ENCOUNTER — Observation Stay (HOSPITAL_COMMUNITY)
Admission: EM | Admit: 2021-08-29 | Discharge: 2021-09-02 | Disposition: A | Discharge status: Home / Self Care | Payer: Medicare Other | Attending: Internal Medicine | Admitting: Internal Medicine

## 2021-08-29 ENCOUNTER — Emergency Department (HOSPITAL_COMMUNITY): Payer: Medicare Other

## 2021-08-29 ENCOUNTER — Other Ambulatory Visit: Payer: Self-pay

## 2021-08-29 DIAGNOSIS — R55 Syncope and collapse: Principal | ICD-10-CM | POA: Insufficient documentation

## 2021-08-29 DIAGNOSIS — R590 Localized enlarged lymph nodes: Secondary | ICD-10-CM | POA: Diagnosis not present

## 2021-08-29 DIAGNOSIS — F02B18 Dementia in other diseases classified elsewhere, moderate, with other behavioral disturbance: Secondary | ICD-10-CM

## 2021-08-29 DIAGNOSIS — R2681 Unsteadiness on feet: Secondary | ICD-10-CM | POA: Diagnosis not present

## 2021-08-29 DIAGNOSIS — R634 Abnormal weight loss: Secondary | ICD-10-CM | POA: Insufficient documentation

## 2021-08-29 DIAGNOSIS — F02B0 Dementia in other diseases classified elsewhere, moderate, without behavioral disturbance, psychotic disturbance, mood disturbance, and anxiety: Secondary | ICD-10-CM | POA: Diagnosis not present

## 2021-08-29 DIAGNOSIS — R001 Bradycardia, unspecified: Secondary | ICD-10-CM | POA: Insufficient documentation

## 2021-08-29 DIAGNOSIS — G309 Alzheimer's disease, unspecified: Secondary | ICD-10-CM | POA: Diagnosis not present

## 2021-08-29 DIAGNOSIS — I10 Essential (primary) hypertension: Secondary | ICD-10-CM | POA: Insufficient documentation

## 2021-08-29 DIAGNOSIS — E875 Hyperkalemia: Secondary | ICD-10-CM | POA: Diagnosis not present

## 2021-08-29 DIAGNOSIS — R6510 Systemic inflammatory response syndrome (SIRS) of non-infectious origin without acute organ dysfunction: Secondary | ICD-10-CM | POA: Diagnosis not present

## 2021-08-29 DIAGNOSIS — F0390 Unspecified dementia without behavioral disturbance: Secondary | ICD-10-CM | POA: Diagnosis present

## 2021-08-29 DIAGNOSIS — R4182 Altered mental status, unspecified: Secondary | ICD-10-CM | POA: Diagnosis present

## 2021-08-29 DIAGNOSIS — D72829 Elevated white blood cell count, unspecified: Secondary | ICD-10-CM | POA: Insufficient documentation

## 2021-08-29 DIAGNOSIS — Z982 Presence of cerebrospinal fluid drainage device: Secondary | ICD-10-CM

## 2021-08-29 LAB — URINALYSIS, ROUTINE W REFLEX MICROSCOPIC
Bilirubin Urine: NEGATIVE
Glucose, UA: NEGATIVE mg/dL
Hgb urine dipstick: NEGATIVE
Ketones, ur: NEGATIVE mg/dL
Leukocytes,Ua: NEGATIVE
Nitrite: NEGATIVE
Protein, ur: NEGATIVE mg/dL
Specific Gravity, Urine: 1.02 (ref 1.005–1.030)
pH: 5 (ref 5.0–8.0)

## 2021-08-29 LAB — CBC WITH DIFFERENTIAL/PLATELET
Abs Immature Granulocytes: 0.03 10*3/uL (ref 0.00–0.07)
Basophils Absolute: 0 10*3/uL (ref 0.0–0.1)
Basophils Relative: 0 %
Eosinophils Absolute: 0 10*3/uL (ref 0.0–0.5)
Eosinophils Relative: 0 %
HCT: 37.1 % — ABNORMAL LOW (ref 39.0–52.0)
Hemoglobin: 10.9 g/dL — ABNORMAL LOW (ref 13.0–17.0)
Immature Granulocytes: 0 %
Lymphocytes Relative: 80 %
Lymphs Abs: 13.8 10*3/uL — ABNORMAL HIGH (ref 0.7–4.0)
MCH: 30.3 pg (ref 26.0–34.0)
MCHC: 29.4 g/dL — ABNORMAL LOW (ref 30.0–36.0)
MCV: 103.1 fL — ABNORMAL HIGH (ref 80.0–100.0)
Monocytes Absolute: 1.4 10*3/uL — ABNORMAL HIGH (ref 0.1–1.0)
Monocytes Relative: 8 %
Neutro Abs: 2 10*3/uL (ref 1.7–7.7)
Neutrophils Relative %: 12 %
Platelets: 117 10*3/uL — ABNORMAL LOW (ref 150–400)
RBC: 3.6 MIL/uL — ABNORMAL LOW (ref 4.22–5.81)
RDW: 15.9 % — ABNORMAL HIGH (ref 11.5–15.5)
WBC: 17.3 10*3/uL — ABNORMAL HIGH (ref 4.0–10.5)
nRBC: 0 % (ref 0.0–0.2)

## 2021-08-29 LAB — BASIC METABOLIC PANEL
Anion gap: 9 (ref 5–15)
BUN: 14 mg/dL (ref 8–23)
CO2: 21 mmol/L — ABNORMAL LOW (ref 22–32)
Calcium: 8.5 mg/dL — ABNORMAL LOW (ref 8.9–10.3)
Chloride: 109 mmol/L (ref 98–111)
Creatinine, Ser: 1.06 mg/dL (ref 0.61–1.24)
GFR, Estimated: >60 mL/min (ref >60)
Glucose, Bld: 93 mg/dL (ref 70–99)
Potassium: 5.3 mmol/L — ABNORMAL HIGH (ref 3.5–5.1)
Sodium: 139 mmol/L (ref 135–145)

## 2021-08-29 LAB — LACTIC ACID, PLASMA: Lactic Acid, Venous: 1.4 mmol/L (ref 0.5–1.9)

## 2021-08-29 LAB — TROPONIN I (HIGH SENSITIVITY)
Troponin I (High Sensitivity): 3 ng/L (ref <18)
Troponin I (High Sensitivity): 2 ng/L (ref <18)

## 2021-08-29 LAB — D-DIMER, QUANTITATIVE: D-Dimer, Quant: 0.92 ug/mL-FEU — ABNORMAL HIGH (ref 0.00–0.50)

## 2021-08-29 MED ORDER — SODIUM CHLORIDE 0.9% FLUSH
3.0000 mL | Freq: Two times a day (BID) | INTRAVENOUS | Status: DC
  Administered 2021-08-30 – 2021-09-01 (×5): 3 mL via INTRAVENOUS

## 2021-08-29 MED ORDER — ENOXAPARIN SODIUM 40 MG/0.4ML IJ SOSY
40.0000 mg | PREFILLED_SYRINGE | Freq: Every day | INTRAMUSCULAR | Status: DC
  Administered 2021-08-30 – 2021-09-01 (×3): 40 mg via SUBCUTANEOUS
  Filled 2021-08-29 (×3): qty 0.4

## 2021-08-29 MED ORDER — ACETAMINOPHEN 325 MG PO TABS
650.0000 mg | ORAL_TABLET | Freq: Four times a day (QID) | ORAL | Status: DC | PRN
  Filled 2021-08-29: qty 2

## 2021-08-29 MED ORDER — ACETAMINOPHEN 650 MG RE SUPP: 650.0000 mg | Freq: Four times a day (QID) | RECTAL | Status: DC | PRN

## 2021-08-29 MED ORDER — ALBUTEROL SULFATE (2.5 MG/3ML) 0.083% IN NEBU: 2.5000 mg | INHALATION_SOLUTION | Freq: Four times a day (QID) | RESPIRATORY_TRACT | Status: DC | PRN

## 2021-08-29 MED ORDER — SODIUM CHLORIDE 0.9 % IV SOLN: INTRAVENOUS | Status: DC

## 2021-08-29 NOTE — H&P (Addendum)
History and Physical    Patient: Jeffrey Huerta EGB:[id number] DOB: September 06, 1945 DOA: 08/29/2021 DOS: the patient was seen and examined on 08/29/2021 PCP: Fleet Contras, MD  Patient coming from: Home  Chief Complaint:  Chief Complaint  Patient presents with   Altered Mental Status   HPI: Khilyn Huerta is a 76 y.o. male with medical history significant of hypertension, dementia, SAH 2006, AVM resection 2007, hydrocephalus s/p VP shunt who presents after being found unresponsive while at [facility].  History is obtained from review of records and family present at bedside as he has significant dementia and unable to.  He was sitting in a chair lost consciousness and was placed on the floor for beginning to wake up.  It was reported that the patient was breathing, but radial pulses were noted to be difficult to palpate.  Upon EMS arrival blood pressure 106/60 with heart rate 48.    The patient had been seen in the ED on 6/28 for having a transient episode of altered awareness.  Work-up included urinalysis which showed no signs of infection, TSH within normal limits, CT scan of the brain and imaging of the VP shunt which showed no acute abnormality.  Patient was given 500 mL of IV fluids and discharged home.  Family notes that the patient is only been complaining of tiredness recently and lost on 10 to 15 pounds over the last 6 months.  Denies any other recent changes.   Admission in the emergency department patient was noted to be afebrile with respirations 1735, pulse 53-60. Labs revealed WBC 17.3, hemoglobin 10.9, platelets 117, sodium 139, potassium 5.3, BUN 15, creatinine 1.06, and calcium 8.5.  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   Review of Systems: As mentioned in the history of present  illness. All other systems reviewed and are negative. Past Medical History:  Diagnosis Date   Aneurysm (HCC)    AVM (arteriovenous malformation)    Dementia (HCC)    Hydrocephalus (HCC)    VP shunt in place   Hypertension    Past Surgical History:  Procedure Laterality Date   VENTRICULOPERITONEAL SHUNT     prior to 2008   Social History:  reports that he has never smoked. He has never used smokeless tobacco. He reports that he does not drink alcohol and does not use drugs.  No Known Allergies  Family History  Problem Relation Age of Onset   Stroke Mother    Hypertension Mother     Prior to Admission medications   Medication Sig Start Date End Date Taking? Authorizing Provider  acetaminophen (TYLENOL) 500 MG tablet Take 500 mg by mouth daily as needed for mild pain (hand cramps).    [provider]    Physical Exam: Vitals:   08/29/21 1115 08/29/21 1206 08/29/21 1230 08/29/21 1300  BP: 121/71 119/68 129/84 115/78  Pulse:  84    Resp: 20 (!) 26 (!) 21 20  Temp:      TempSrc:      SpO2:  100%    Weight:      Height:       Exam  Constitutional: Thin frail elderly male Eyes: PERRL, lids and conjunctivae normal ENMT: Mucous membranes are dry. Posterior pharynx clear of any exudate or lesions.  Neck: normal, supple, no masses, no thyromegaly Respiratory: clear  to auscultation bilaterally, no wheezing, no crackles. Normal respiratory effort. No accessory muscle use.  Cardiovascular: Regular rate and rhythm, no murmurs / rubs / gallops. No extremity edema.   Abdomen: no tenderness, no masses palpated.  Bowel sounds positive.  Musculoskeletal: no clubbing / cyanosis. No joint deformity upper and lower extremities. Good ROM, no contractures. Normal muscle tone.  Skin: no rashes, lesions, ulcers. No induration Neurologic: CN 2-12 grossly intact.  Strength 5/5 in all 4.  Psychiatric: Alert and oriented to self, but not place or time.  Normal mood.  Data  Reviewed:  EKG noted sinus rhythm at 67 bpm with QTc 487.  Reviewed labs, imaging, and pertinent records as noted above in HPI  Assessment and Plan: Syncope and collapse Patient presents after being found to be unresponsive while at [facility] day[facility].  During that time he was noted to be hypotensive and bradycardic.  CPR was not required and patient after being laid flat.  He notes that he is sedimentary from him.  Sometimes question possibility of a PE.  He was noted to have similar episode of being altered seen in the ED 2 weeks.  Question the possibility of arrhythmia as patient was initially noted to be bradycardic vs. dehydration as family notes he does not drink a lot of water vs. the possibility of a pulmonary embolus. -Admit to a telemetry bed -Check D-dimer.  Will check CT angiogram of the chest if elevated. -Check echocardiogram -Consider checking orthostatic vital signs if no concern for pulmonary embolus -Follow-up telemetry overnight  SIRS leukocytosis Acute.  WBC elevated 17.3.  Patient was also noted to be tachypnea meeting SIRS criteria.  Urinalysis did not show any signs of infection and chest x-ray was within normal limits. -Follow-up peripheral smear -Check lactic acid and blood culture -Monitor off antibiotics at this time  Bradycardia HR initially 48. BP currently maintianed. -Message sent to Salley Hews to arrange for  possible outpatient Holter monitor  Hyperkalemia Acute.  Initial potassium 5.3. -Normal saline IV fluids 75 mL/hr -Recheck potassium in  Alzheimer's dementia -Delirium precautions  Status post VP shunt Prior history of subarachnoid hemorrhage and AVM malformation requiring VP shunt for hydrocephalus in the early 2000s.  X-ray imaging noted however functioning during ED visit at the end of June.  Weight loss Family notes that the patient has lost 10 to 15 pounds in the last 6 months but they report he has a good appetite despite not  taking in enough water.  Thyroid studies 0.805 on 6/28.   Advance Care Planning:   Code Status: Full Code    Consults: None  Family Communication: Family updated   Severity of Illness: The appropriate patient status for this patient is OBSERVATION. Observation status is judged to be reasonable and necessary in order to provide the required intensity of service to ensure the patient's safety. The patient's presenting symptoms, physical exam findings, and initial radiographic and laboratory data in the context of their medical condition is felt to place them at decreased risk for further clinical deterioration. Furthermore, it is anticipated that the patient will be medically stable for discharge from the hospital within 2 midnights of admission.   Author: Clydie Braun, MD 08/29/2021 1:38 PM  For on call review [email or web address].

## 2021-08-29 NOTE — ED Triage Notes (Signed)
Staff at the adult daycare stated he was unresponsive sitting up in a chair. Breathing they stated they had no radial pulse moved to the floor and the pt started to wake up. 106/60 weak radials at 48 for EMS CBG 117 alert self baseline

## 2021-08-29 NOTE — ED Provider Notes (Signed)
[facility] EMERGENCY DEPARTMENT Provider Note   CSN: [id number] Arrival date & time: 08/29/21  0954     History  Chief Complaint  Patient presents with   Altered Mental Status    Rhiley Solem is a 76 y.o. 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 No associated head trauma or injury. Pt was sitting when he lost consciousness. He was placed supine on the floor and began to wake up. Per EMS, pt had weak bilateral radial pulses. Limited history obtained secondary to patient's baseline mental status. Of note, pt was evaluated in the ED 6/28 for a similar episode.  Wife at bedside reports staff at facility told her that along with hypotension he had some bradycardia as well. Although patient has Advanced Alzheimer's, family wishes the patient to be FULL CODE.   The history is provided by a relative.  Altered Mental Status      Home Medications Prior to Admission medications   Medication Sig Start Date End Date Taking? Authorizing Provider  acetaminophen (TYLENOL) 500 MG tablet Take 500 mg by mouth daily as needed for mild pain (hand cramps).    [provider]      Allergies    Patient has no known allergies.    Review of Systems   Review of Systems  Physical Exam Updated Vital Signs BP 115/68   Temp (!) 97.5 F (36.4 C) (Oral)   Resp (!) 35   Ht '5\' 3"'$  (1.6 m)   Wt 58.2 kg   BMI 22.73 kg/m  Physical Exam Vitals and nursing note reviewed.  Constitutional:      General: He is not in acute distress.    Appearance: He is well-developed. He is not diaphoretic.  HENT:     Head: Normocephalic and atraumatic.  Eyes:     General: No scleral icterus.    Extraocular Movements: Extraocular movements intact.     Conjunctiva/sclera: Conjunctivae normal.     Pupils: Pupils are equal, round, and reactive to light.   Cardiovascular:     Rate and Rhythm: Normal rate and regular rhythm.     Heart sounds: Normal heart sounds.  Pulmonary:     Effort: Pulmonary effort is normal. No respiratory distress.     Breath sounds: Normal breath sounds.  Abdominal:     General: There is no distension.     Palpations: Abdomen is soft.     Tenderness: There is no abdominal tenderness.  Musculoskeletal:     Cervical back: Normal range of motion and neck supple.     Right lower leg: No edema.     Left lower leg: No edema.  Skin:    General: Skin is warm and dry.  Neurological:     Mental Status: He is alert.  Psychiatric:        Behavior: Behavior normal.     ED Results / Procedures / Treatments   Labs (all labs ordered are listed, but only abnormal results are displayed) Labs Reviewed - No data to display  EKG None  Radiology No results found.  Procedures Procedures    Medications Ordered in ED Medications - No data to display  ED Course/ Medical Decision Making/ A&P Clinical Course as of 08/29/21 1529  Wed Aug 29, 2021  1316 WBC(!): 17.3 [AH]  1316 Platelets(!): 117 [AH]  1316 Potassium(!): 5.3 Suspect hemolysis- normal renal  function  [AH]    Clinical Course User Index [AH] Margarita Mail, PA-C                           Medical Decision Making This patient presents to the ED for concern of syncope, this involves an extensive number of treatment options, and is a complaint that carries with it a high risk of complications and morbidity.  The differential diagnosis includes The differential for syncope is extensive and includes, but is not limited to: arrythmia (Vtach, SVT, SSS, sinus arrest, AV block, bradycardia) aortic stenosis, AMI, HOCM, PE, atrial myxoma, pulmonary hypertension, orthostatic hypotension, (hypovolemia, drug effect, GB syndrome, micturition, cough, swall) carotid sinus sensitivity, Seizure, TIA/CVA, hypoglycemia,  Vertigo.    Co morbidities that complicate the  patient evaluation       Dementia, history of AVM, hypertension, VP shunt   Additional history obtained:  Additional history obtained from family at bedside    Lab Tests:  I Ordered, and personally interpreted labs.  The pertinent results include:    BMP shows mildly elevated potassium likely hemolysis.  CBC shows elevated white blood cell count, macrocytic anemia, low platelet levels this all appears to be chronic.  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  I personally viewed and interpreted the cardiac monitored which showed an underlying rhythm of: Sinus rhythm      Test Considered:          Critical Interventions:          Consultations Obtained:  I requested consultation with the hospitalist service,  and discussed lab and imaging findings as well as pertinent plan - they will admit the patient:   Problem List / ED Course:       High risk syncope   Reevaluation:   Dispostion:  After consideration of the diagnostic results and the patients response to treatment, I feel that the patent would benefit from admission.    Amount and/or Complexity of Data Reviewed Labs: ordered. Decision-making details documented in ED Course. Radiology: ordered.  Risk Decision regarding hospitalization.           Final Clinical Impression(s) / ED Diagnoses Final diagnoses:  Syncope and collapse  Moderate Alzheimer's dementia, unspecified timing of dementia onset, unspecified whether behavioral, psychotic, or mood disturbance or anxiety [facility])    Rx / DC Orders ED Discharge Orders     None         Margarita Mail, PA-C 08/29/21  1537    Tegeler, Gwenyth Allegra, MD 08/30/21 [phone]

## 2021-08-29 NOTE — ED Notes (Signed)
Family states no pacemaker

## 2021-08-29 NOTE — ED Notes (Signed)
MD aware that pt family denies pt having a Pacemaker

## 2021-08-30 ENCOUNTER — Observation Stay (HOSPITAL_BASED_OUTPATIENT_CLINIC_OR_DEPARTMENT_OTHER): Payer: Medicare Other

## 2021-08-30 ENCOUNTER — Observation Stay (HOSPITAL_COMMUNITY): Payer: Medicare Other

## 2021-08-30 ENCOUNTER — Encounter (HOSPITAL_COMMUNITY): Payer: Self-pay | Admitting: Internal Medicine

## 2021-08-30 ENCOUNTER — Other Ambulatory Visit: Payer: Self-pay | Admitting: Physician Assistant

## 2021-08-30 DIAGNOSIS — R55 Syncope and collapse: Secondary | ICD-10-CM | POA: Diagnosis not present

## 2021-08-30 DIAGNOSIS — E875 Hyperkalemia: Secondary | ICD-10-CM | POA: Diagnosis present

## 2021-08-30 DIAGNOSIS — R6510 Systemic inflammatory response syndrome (SIRS) of non-infectious origin without acute organ dysfunction: Secondary | ICD-10-CM | POA: Diagnosis present

## 2021-08-30 LAB — ECHOCARDIOGRAM COMPLETE
AR max vel: 1.76 cm2
AV Area VTI: 1.98 cm2
AV Area mean vel: 1.75 cm2
AV Mean grad: 5 mmHg
AV Peak grad: 7.3 mmHg
Ao pk vel: 1.35 m/s
Area-P 1/2: 5.7 cm2
Calc EF: 57.5 %
Height: 63 in
Single Plane A2C EF: 44.6 %
Single Plane A4C EF: 62.8 %
Weight: 2052.92 oz

## 2021-08-30 LAB — BASIC METABOLIC PANEL
Anion gap: 4 — ABNORMAL LOW (ref 5–15)
BUN: 13 mg/dL (ref 8–23)
CO2: 23 mmol/L (ref 22–32)
Calcium: 8.1 mg/dL — ABNORMAL LOW (ref 8.9–10.3)
Chloride: 109 mmol/L (ref 98–111)
Creatinine, Ser: 0.96 mg/dL (ref 0.61–1.24)
GFR, Estimated: >60 mL/min (ref >60)
Glucose, Bld: 78 mg/dL (ref 70–99)
Potassium: 4.7 mmol/L (ref 3.5–5.1)
Sodium: 136 mmol/L (ref 135–145)

## 2021-08-30 LAB — CBC
HCT: 29.4 % — ABNORMAL LOW (ref 39.0–52.0)
Hemoglobin: 9 g/dL — ABNORMAL LOW (ref 13.0–17.0)
MCH: 29.6 pg (ref 26.0–34.0)
MCHC: 30.6 g/dL (ref 30.0–36.0)
MCV: 96.7 fL (ref 80.0–100.0)
Platelets: 106 10*3/uL — ABNORMAL LOW (ref 150–400)
RBC: 3.04 MIL/uL — ABNORMAL LOW (ref 4.22–5.81)
RDW: 15.9 % — ABNORMAL HIGH (ref 11.5–15.5)
WBC: 16.3 10*3/uL — ABNORMAL HIGH (ref 4.0–10.5)
nRBC: 0 % (ref 0.0–0.2)

## 2021-08-30 LAB — HEMOGLOBIN AND HEMATOCRIT, BLOOD
HCT: 29.7 % — ABNORMAL LOW (ref 39.0–52.0)
Hemoglobin: 8.8 g/dL — ABNORMAL LOW (ref 13.0–17.0)

## 2021-08-30 MED ORDER — IOHEXOL 350 MG/ML SOLN
69.0000 mL | Freq: Once | INTRAVENOUS | Status: AC | PRN
  Administered 2021-08-30: 69 mL via INTRAVENOUS

## 2021-08-30 NOTE — Progress Notes (Signed)
PROGRESS NOTE    Jeffrey Huerta  HYI:[id number]  DOB: 11/11/1945  DOA: 08/29/2021 PCP: Jeffrey Ebbs, MD Outpatient Specialists:   Hospital course:  76 y.o. male with medical history significant of hypertension, dementia, [place] 2006, AVM resection 2007, hydrocephalus s/p VP shunt was admitted for syncope/episode of unresponsiveness while at adult daycare on 08/29/2021.  Of note patient had had a similar episode of altered awareness on 08/15/2021 and was seen in the ED and discharged home.  Work-up in the ED including CT scan of the brain and imaging of the VP shunt which showed no acute abnormality.  History  Subjective:  Patient seen in conjunction with his wife at bedside and daughter over the phone.  Patient has no complaints.  Patient's wife notes that he does have decreased p.o. intake in general.  She is not sure if he has had any black or red stools.  Patient denies any chest pain.  Reviewed low heart rate and patient's wife states she is not sure if he has had low heart rates in the past or not.  Reviewed results of CT with diffuse adenopathy.  Patient does not seem to have had night sweats or fevers at home.  However he has had a significant weight loss despite normal appetite.  Objective: Vitals:   08/30/21 0005 08/30/21 0334 08/30/21 1159 08/30/21 1625  BP: 135/83 139/73 118/61 128/71  Pulse: 63 (!) 56 70 68  Resp: '18 17 18 18  '$ Temp: 98.2 F (36.8 C) 98.2 F (36.8 C) 98.9 F (37.2 C) 98.4 F (36.9 C)  TempSrc: Oral Axillary Oral Oral  SpO2: 98% 98% 99% 100%  Weight:      Height:        Intake/Output Summary (Last 24 hours) at 08/30/2021 1627 Last data filed at 08/30/2021 0600 Gross per 24 hour  Intake 732.18 ml  Output 200 ml  Net 532.18 ml   Filed Weights   08/29/21 0959  Weight: 58.2 kg     Exam:  General: Comfortable man lying in bed in NAD with attentive wife at bedside, patient has somewhat of a blank affect. Eyes: sclera anicteric, conjuctiva mild  injection bilaterally CVS: S1-S2, regular  Respiratory:  decreased air entry bilaterally secondary to decreased inspiratory effort, rales at bases  GI: NABS, soft, NT  LE: No edema.    Assessment & Plan:   Syncope Patient was apparently unresponsive while sitting in a chair at his adult daycare He had a similar episode of transient alteration of consciousness 2 weeks ago VP shunt and head CT are within normal limits Patient does have bradycardia, continue patient on telemetry--Jeffrey Huerta was contacted about possible outpatient cardiac monitoring if warranted upon discharge. Echocardiogram done below shows no valvular abnormalities and normal EF Repeat CBC today however shows a decreased hemoglobin since yesterday although patient remains hemodynamically stable. Follow H&H, guaiac stool Continue IV fluid resuscitation  Incidental adenopathy on CT Discussed with patient and family who would like to pursue further work-up IR consult placed, plan is for axillary/super clavicular biopsy on 7/17 which will need to be rescheduled as an outpatient procedure if patient is discharged before 09/03/2021. Patient PCP to follow-up on results if done as an outpatient.  Leukocytosis Very well may be related to diffuse adenopathy, biopsy plans as above No evidence of infection  Alzheimer's disease Delirium precautions  VP shunt Placed 10 years ago after [facility] and AVM   DVT prophylaxis: Lovenox Code Status: Full Family Communication: Discussion with patient's wife and  daughter Jeffrey Huerta Disposition Plan:   Patient is from: Home  Anticipated Discharge Location: Home  Barriers to Discharge: Undergoing work-up for syncope  Is patient medically stable for Discharge: Not yet   Scheduled Meds:  enoxaparin (LOVENOX) injection  40 mg Subcutaneous Daily   sodium chloride flush  3 mL Intravenous Q12H   Continuous Infusions:  sodium chloride 75 mL/hr at 08/30/21 1546    Data Reviewed:  Basic  Metabolic Panel: Recent Labs  Lab 08/29/21 1149 08/30/21 0140  NA 139 136  K 5.3* 4.7  CL 109 109  CO2 21* 23  GLUCOSE 93 78  BUN 14 13  CREATININE 1.06 0.96  CALCIUM 8.5* 8.1*    CBC: Recent Labs  Lab 08/29/21 1149 08/30/21 0140  WBC 17.3* 16.3*  NEUTROABS 2.0  --   HGB 10.9* 9.0*  HCT 37.1* 29.4*  MCV 103.1* 96.7  PLT 117* 106*    Studies: ECHOCARDIOGRAM COMPLETE  Result Date: 08/30/2021    ECHOCARDIOGRAM REPORT   Patient Name:   Jeffrey Huerta Date of Exam: 08/30/2021 Medical Rec #:  [id number]  Height:       63.0 in Accession #:    [id number] Weight:       128.3 lb Date of Birth:  Apr 23, 1945  BSA:          1.601 m Patient Age:    76 years   BP:           139/73 mmHg Patient Gender: M          HR:           82 bpm. Exam Location:  Inpatient Procedure: 2D Echo, Cardiac Doppler and Color Doppler Indications:    Syncope  History:        Patient has prior history of Echocardiogram examinations, most                 recent 02/21/2010. Risk Factors:Hypertension. Dementia.  Sonographer:    Jeffrey Huerta RCS Referring Phys: [phone] Jeffrey Huerta  Sonographer Comments: Technically challenging study due to limited acoustic windows and no parasternal window. Image acquisition challenging due to patient body habitus. IMPRESSIONS  1. Left ventricular ejection fraction, by estimation, is 60 to 65%. The left ventricle has normal function. The left ventricle has no regional wall motion abnormalities. Left ventricular diastolic parameters are consistent with Grade I diastolic dysfunction (impaired relaxation).  2. Right ventricular systolic function is normal. The right ventricular size is normal. Tricuspid regurgitation signal is inadequate for assessing PA pressure.  3. The mitral valve is normal in structure. No evidence of mitral valve regurgitation. No evidence of mitral stenosis.  4. The aortic valve is tricuspid. There is mild calcification of the aortic valve. Aortic valve regurgitation is not  visualized. No aortic stenosis is present.  5. The inferior vena cava is normal in size with greater than 50% respiratory variability, suggesting right atrial pressure of 3 mmHg. FINDINGS  Left Ventricle: Left ventricular ejection fraction, by estimation, is 60 to 65%. The left ventricle has normal function. The left ventricle has no regional wall motion abnormalities. The left ventricular internal cavity size was normal in size. There is  no left ventricular hypertrophy. Left ventricular diastolic parameters are consistent with Grade I diastolic dysfunction (impaired relaxation). Right Ventricle: The right ventricular size is normal. No increase in right ventricular wall thickness. Right ventricular systolic function is normal. Tricuspid regurgitation signal is inadequate for assessing PA pressure. Left Atrium: Left atrial size was normal in  size. Right Atrium: Right atrial size was normal in size. Pericardium: There is no evidence of pericardial effusion. Mitral Valve: The mitral valve is normal in structure. No evidence of mitral valve regurgitation. No evidence of mitral valve stenosis. Tricuspid Valve: The tricuspid valve is normal in structure. Tricuspid valve regurgitation is not demonstrated. Aortic Valve: The aortic valve is tricuspid. There is mild calcification of the aortic valve. Aortic valve regurgitation is not visualized. No aortic stenosis is present. Aortic valve mean gradient measures 5.0 mmHg. Aortic valve peak gradient measures 7.3 mmHg. Aortic valve area, by VTI measures 1.98 cm. Pulmonic Valve: The pulmonic valve was normal in structure. Pulmonic valve regurgitation is not visualized. Aorta: The aortic root and ascending aorta are structurally normal, with no evidence of dilitation. Venous: The inferior vena cava is normal in size with greater than 50% respiratory variability, suggesting right atrial pressure of 3 mmHg. IAS/Shunts: No atrial level shunt detected by color flow Doppler.  LEFT  VENTRICLE PLAX 2D LVOT diam:     1.60 cm     Diastology LV SV:         42          LV e' medial:    9.36 cm/s LV SV Index:   26          LV E/e' medial:  7.5 LVOT Area:     2.01 cm    LV e' lateral:   11.70 cm/s                            LV E/e' lateral: 6.0  LV Volumes (MOD) LV vol d, MOD A2C: 39.9 ml LV vol d, MOD A4C: 51.9 ml LV vol s, MOD A2C: 22.1 ml LV vol s, MOD A4C: 19.3 ml LV SV MOD A2C:     17.8 ml LV SV MOD A4C:     51.9 ml LV SV MOD BP:      27.9 ml RIGHT VENTRICLE             IVC RV Basal diam:  2.90 cm     IVC diam: 1.40 cm RV Mid diam:    1.80 cm RV S prime:     16.90 cm/s TAPSE (M-mode): 1.7 cm LEFT ATRIUM             Index        RIGHT ATRIUM          Index LA Vol (A2C):   13.0 ml 8.12 ml/m   RA Area:     7.74 cm LA Vol (A4C):   26.4 ml 16.49 ml/m  RA Volume:   12.00 ml 7.49 ml/m LA Biplane Vol: 19.3 ml 12.05 ml/m  AORTIC VALVE                     PULMONIC VALVE AV Area (Vmax):    1.76 cm      PV Vmax:       1.20 m/s AV Area (Vmean):   1.75 cm      PV Peak grad:  5.8 mmHg AV Area (VTI):     1.98 cm AV Vmax:           135.00 cm/s AV Vmean:          103.000 cm/s AV VTI:            0.210 m AV Peak Grad:      7.3 mmHg AV Mean  Grad:      5.0 mmHg LVOT Vmax:         118.00 cm/s LVOT Vmean:        89.600 cm/s LVOT VTI:          0.207 m LVOT/AV VTI ratio: 0.99 MITRAL VALVE MV Area (PHT): 5.70 cm    SHUNTS MV Decel Time: 133 msec    Systemic VTI:  0.21 m MV E velocity: 70.30 cm/s  Systemic Diam: 1.60 cm MV A velocity: 94.70 cm/s MV E/A ratio:  0.74 Dalton McleanMD Electronically signed by Franki Monte Signature Date/Time: 08/30/2021/3:26:27 PM    Final    CT Angio Chest Pulmonary Embolism (PE) W or WO Contrast  Result Date: 08/30/2021 CLINICAL DATA:  Pulmonary embolism (PE) suspected, positive D-dimer EXAM: CT ANGIOGRAPHY CHEST WITH CONTRAST TECHNIQUE: Multidetector CT imaging of the chest was performed using the standard protocol during bolus administration of intravenous contrast.  Multiplanar CT image reconstructions and MIPs were obtained to evaluate the vascular anatomy. RADIATION DOSE REDUCTION: This exam was performed according to the departmental dose-optimization program which includes automated exposure control, adjustment of the mA and/or kV according to patient size and/or use of iterative reconstruction technique. CONTRAST:  39m OMNIPAQUE IOHEXOL 350 MG/ML SOLN COMPARISON:  None Available. FINDINGS: Cardiovascular: No filling defects in the pulmonary arteries to suggest pulmonary emboli. Heart is normal size. Scattered coronary artery and aortic calcifications. Ascending thoracic aorta mildly dilated at 4.1 cm. Mediastinum/Nodes: Bilateral axillary adenopathy. Index right axillary lymph node has a short axis diameter of 12 mm. Index left axillary lymph node has a short axis diameter of 18 mm. Mediastinal adenopathy. AP window lymph node has a short axis diameter of 11 mm. Right paratracheal lymph node has a short axis diameter of 9 mm. Bilateral hilar adenopathy with index right hilar lymph having a short axis diameter of 16 mm. Trachea and esophagus are unremarkable. Thyroid unremarkable. Lungs/Pleura: Dependent and bibasilar atelectasis.  No effusions. Upper Abdomen: Upper abdominal adenopathy noted. Musculoskeletal: Chest wall soft tissues are unremarkable. No acute bony abnormality. Review of the MIP images confirms the above findings. IMPRESSION: No evidence of pulmonary embolus. Bilateral axillary, hilar, mediastinal and upper abdominal adenopathy concerning for lymphoma or metastatic disease. Coronary artery disease. 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 Recommend annual imaging followup by CTA or MRA. This recommendation follows 2010 ACCF/AHA/AATS/ACR/ASA/SCA/SCAI/SIR/STS/SVM Guidelines for the Diagnosis and Management of Patients with Thoracic Aortic Disease. Circulation. 2010; 121:: J825-K539 Aortic aneurysm NOS (ICD10-I71.9) Aortic Atherosclerosis (ICD10-I70.0).  Electronically Signed   By: KRolm BaptiseM.D.   On: 08/30/2021 01:44   CT HEAD WO CONTRAST  Result Date: 08/29/2021 CLINICAL DATA:  Syncope/presyncope, cerebrovascular cause suspected EXAM: CT HEAD WITHOUT CONTRAST TECHNIQUE: Contiguous axial images were obtained from the base of the skull through the vertex without intravenous contrast. RADIATION DOSE REDUCTION: This exam was performed according to the departmental dose-optimization program which includes automated exposure control, adjustment of the mA and/or kV according to patient size and/or use of iterative reconstruction technique. COMPARISON:  CT head August 15, 2021. FINDINGS: Brain: Suspected small (probably 3-4 mm) bilateral low-density subdural fluid collections, similar to the prior. Right frontal approach ventriculostomy catheter similar hypoattenuation along the ventriculostomy tract and similar position of the tip. Visualized portions of the ventriculostomy catheter appear intact. Similar size of the ventricular system without evidence of hydrocephalus. Remote left cerebellar infarct. Probable embolization coils in this region. No evidence of acute large vascular territory infarct. Areas of patchy hypoattenuation appears similar to prior  and are nonspecific but compatible with chronic microvascular ischemic disease. Vascular: Calcific intracranial atherosclerosis. Skull: No mastoid effusions Sinuses/Orbits: Left frontal, left frontoethmoidal and anterior left ethmoid air cell opacification. Frothy secretions in the left sphenoid sinus and posterior left ethmoid air cell. Left maxillary sinus mucosal thickening. Other: Large right mastoid effusion with middle ear fluid. IMPRESSION: 1. Suspected small (probably 3-4 mm) bilateral low-density subdural fluid collections, similar to the prior. 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 No significant mass effect. An MRI could confirm and further evaluate if clinically warranted. 2.  Similar size of the ventricular system without evidence of hydrocephalus. Right frontal approach ventriculostomy catheter in place. 3. 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 4. Paranasal sinus disease, described below. 5. Large right mastoid effusion with middle ear fluid. Electronically Signed   By: Margaretha Sheffield M.D.   On: 08/29/2021 11:33   DG Chest Port 1 View  Result Date: 08/29/2021 CLINICAL DATA:  loc EXAM: PORTABLE CHEST 1 VIEW COMPARISON:  None Available. FINDINGS: Again seen is a right VP shunt stable. Cardiomediastinal silhouette is upper normal. Thoracic aorta is ectatic. Lungs remain clear. The visualized skeletal structures are unremarkable. IMPRESSION: No active disease and no significant interval change. Electronically Signed   By: Frazier Richards M.D.   On: 08/29/2021 10:51    Principal Problem:   Syncope and collapse Active Problems:   SIRS (systemic inflammatory response syndrome) ([place])   Dementia (HCC)   S/P VP shunt   Hyperkalemia     Myson Levi Tublu Luke Rigsbee, [facility]  If 7PM-7AM, please contact night-coverage [email or web address]   LOS: 0 days

## 2021-08-30 NOTE — Progress Notes (Signed)
Monitor for syncope.

## 2021-08-30 NOTE — Progress Notes (Signed)
Echocardiogram 2D Echocardiogram has been performed.  Jeffrey Huerta 08/30/2021, 11:04 AM

## 2021-08-30 NOTE — Care Management Obs Status (Signed)
[place] NOTIFICATION   Patient Details  Name: Jeffrey Huerta MRN: [id number] Date of Birth: 1946-01-21   Medicare Observation Status Notification Given:  Yes    Dawayne Patricia, RN 08/30/2021, 4:13 PM

## 2021-08-30 NOTE — H&P (Addendum)
Jeffrey Complaint: Patient was seen in consultation today for axillary and mediastinal lymphadenopathy at the request of Claria Dice, MD  Referring Physician(s): Claria Dice, MD  Supervising Physician: Albin Felling  Patient Status: [facility] - In-pt  History of Present Illness: Jeffrey Huerta is a 76 y.o. male with PMH significant for HTN, dementia, [place] 2006, AVM resection 2007 and hydrocephalus s/p VP shunt. Pt was found unresponsive at his adult daycare. Pt was transported vis EMS to [facility] ED. Workup was negative for infection, CT brain negative with VP shunt normal. Family reported pt has 10-15 lbs weight loss over last 6 months. Pt CT angio for PE d/t positive d-dimer was negative for PE but resulted bilateral axillary, hilar, mediastinal and upper abdominal adenopathy concerning for lymphoma or metastatic disease. Dr. Jamse Arn referred pt to IR for lymph node biopsy. Dr. Kathlene Cote approved L axillary/supraclavicular lymph node biopsy with sedation. Procedure tentatively scheduled for 09/03/21. If pt is d/c prior, he will need to be rescheduled for OP biopsy.   Past Medical History:  Diagnosis Date   Aneurysm ([place])    AVM (arteriovenous malformation)    Dementia ([place])    Hydrocephalus ([place])    VP shunt in place   Hypertension     Past Surgical History:  Procedure Laterality Date   VENTRICULOPERITONEAL SHUNT     prior to 2008    Allergies: Patient has no known allergies.  Medications: Prior to Admission medications   Medication Sig Start Date End Date Taking? Authorizing Provider  acetaminophen (TYLENOL) 500 MG tablet Take 500 mg by mouth daily as needed for mild pain (hand cramps).   Yes [provider]     Family History  Problem Relation Age of Onset   Stroke Mother    Hypertension Mother     Social History   Socioeconomic History   Marital status: Married    Spouse name: Not on file   Number of children: 3   Years of education:  10th   Highest education level: Not on file  Occupational History   Occupation: Retired  Tobacco Use   Smoking status: Never   Smokeless tobacco: Never  Vaping Use   Vaping Use: Never used  Substance and Sexual Activity   Alcohol use: No    Alcohol/week: 0.0 standard drinks of alcohol   Drug use: No   Sexual activity: Not on file  Other Topics Concern   Not on file  Social History Narrative   Lives at home with wife.   Right-handed.   No caffeine use.   Social Determinants of Health   Financial Resource Strain: Not on file  Food Insecurity: Not on file  Transportation Needs: Not on file  Physical Activity: Not on file  Stress: Not on file  Social Connections: Not on file    Review of Systems: A 12 point ROS discussed and pertinent positives are indicated in the HPI above.  All other systems are negative.  Review of Systems  Unable to perform ROS: Dementia    Vital Signs: BP 118/61 (BP Location: Right Arm)   Pulse 70   Temp 98.9 F (37.2 C) (Oral)   Resp 18   Ht '5\' 3"'$  (1.6 m)   Wt 128 lb 4.9 oz (58.2 kg)   SpO2 99%   BMI 22.73 kg/m    Physical Exam Vitals reviewed.  Constitutional:      General: He is not in acute distress. HENT:     Head: Normocephalic and atraumatic.  Mouth/Throat:     Mouth: Mucous membranes are dry.     Pharynx: Oropharynx is clear.  Eyes:     Extraocular Movements: Extraocular movements intact.     Pupils: Pupils are equal, round, and reactive to light.  Cardiovascular:     Rate and Rhythm: Normal rate and regular rhythm.  Pulmonary:     Effort: Pulmonary effort is normal. No respiratory distress.     Breath sounds: Rhonchi present.  Abdominal:     General: Bowel sounds are normal. There is no distension.     Palpations: Abdomen is soft.     Tenderness: There is no abdominal tenderness. There is no guarding.  Musculoskeletal:     Right lower leg: No edema.     Left lower leg: No edema.  Skin:    General: Skin is warm  and dry.  Neurological:     Mental Status: He is alert. Mental status is at baseline. He is disoriented.     Imaging: CT Angio Chest Pulmonary Embolism (PE) W or WO Contrast  Result Date: 08/30/2021 CLINICAL DATA:  Pulmonary embolism (PE) suspected, positive D-dimer EXAM: CT ANGIOGRAPHY CHEST WITH CONTRAST TECHNIQUE: Multidetector CT imaging of the chest was performed using the standard protocol during bolus administration of intravenous contrast. Multiplanar CT image reconstructions and MIPs were obtained to evaluate the vascular anatomy. RADIATION DOSE REDUCTION: This exam was performed according to the departmental dose-optimization program which includes automated exposure control, adjustment of the mA and/or kV according to patient size and/or use of iterative reconstruction technique. CONTRAST:  64m OMNIPAQUE IOHEXOL 350 MG/ML SOLN COMPARISON:  None Available. FINDINGS: Cardiovascular: No filling defects in the pulmonary arteries to suggest pulmonary emboli. Heart is normal size. Scattered coronary artery and aortic calcifications. Ascending thoracic aorta mildly dilated at 4.1 cm. Mediastinum/Nodes: Bilateral axillary adenopathy. Index right axillary lymph node has a short axis diameter of 12 mm. Index left axillary lymph node has a short axis diameter of 18 mm. Mediastinal adenopathy. AP window lymph node has a short axis diameter of 11 mm. Right paratracheal lymph node has a short axis diameter of 9 mm. Bilateral hilar adenopathy with index right hilar lymph having a short axis diameter of 16 mm. Trachea and esophagus are unremarkable. Thyroid unremarkable. Lungs/Pleura: Dependent and bibasilar atelectasis.  No effusions. Upper Abdomen: Upper abdominal adenopathy noted. Musculoskeletal: Chest wall soft tissues are unremarkable. No acute bony abnormality. Review of the MIP images confirms the above findings. IMPRESSION: No evidence of pulmonary embolus. Bilateral axillary, hilar, mediastinal and  upper abdominal adenopathy concerning for lymphoma or metastatic disease. Coronary artery disease. 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 Recommend annual imaging followup by CTA or MRA. This recommendation follows 2010 ACCF/AHA/AATS/ACR/ASA/SCA/SCAI/SIR/STS/SVM Guidelines for the Diagnosis and Management of Patients with Thoracic Aortic Disease. Circulation. 2010; 121:: K440-N027 Aortic aneurysm NOS (ICD10-I71.9) Aortic Atherosclerosis (ICD10-I70.0). Electronically Signed   By: KRolm BaptiseM.D.   On: 08/30/2021 01:44   CT HEAD WO CONTRAST  Result Date: 08/29/2021 CLINICAL DATA:  Syncope/presyncope, cerebrovascular cause suspected EXAM: CT HEAD WITHOUT CONTRAST TECHNIQUE: Contiguous axial images were obtained from the base of the skull through the vertex without intravenous contrast. RADIATION DOSE REDUCTION: This exam was performed according to the departmental dose-optimization program which includes automated exposure control, adjustment of the mA and/or kV according to patient size and/or use of iterative reconstruction technique. COMPARISON:  CT head August 15, 2021. FINDINGS: Brain: Suspected small (probably 3-4 mm) bilateral low-density subdural fluid collections, similar to the prior. Right  frontal approach ventriculostomy catheter similar hypoattenuation along the ventriculostomy tract and similar position of the tip. Visualized portions of the ventriculostomy catheter appear intact. Similar size of the ventricular system without evidence of hydrocephalus. Remote left cerebellar infarct. Probable embolization coils in this region. No evidence of acute large vascular territory infarct. Areas of patchy hypoattenuation appears similar to prior and are nonspecific but compatible with chronic microvascular ischemic disease. Vascular: Calcific intracranial atherosclerosis. Skull: No mastoid effusions Sinuses/Orbits: Left frontal, left frontoethmoidal and anterior left ethmoid air cell opacification. Frothy  secretions in the left sphenoid sinus and posterior left ethmoid air cell. Left maxillary sinus mucosal thickening. Other: Large right mastoid effusion with middle ear fluid. IMPRESSION: 1. Suspected small (probably 3-4 mm) bilateral low-density subdural fluid collections, similar to the prior. 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 No significant mass effect. An MRI could confirm and further evaluate if clinically warranted. 2. Similar size of the ventricular system without evidence of hydrocephalus. Right frontal approach ventriculostomy catheter in place. 3. 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 4. Paranasal sinus disease, described below. 5. Large right mastoid effusion with middle ear fluid. Electronically Signed   By: Margaretha Sheffield M.D.   On: 08/29/2021 11:33   DG Chest Port 1 View  Result Date: 08/29/2021 CLINICAL DATA:  loc EXAM: PORTABLE CHEST 1 VIEW COMPARISON:  None Available. FINDINGS: Again seen is a right VP shunt stable. Cardiomediastinal silhouette is upper normal. Thoracic aorta is ectatic. Lungs remain clear. The visualized skeletal structures are unremarkable. IMPRESSION: No active disease and no significant interval change. Electronically Signed   By: Frazier Richards M.D.   On: 08/29/2021 10:51   CT HEAD WO CONTRAST (5MM)  Result Date: 08/15/2021 CLINICAL DATA:  Mental status change, persistent or worsening EXAM: CT HEAD WITHOUT CONTRAST TECHNIQUE: Contiguous axial images were obtained from the base of the skull through the vertex without intravenous contrast. RADIATION DOSE REDUCTION: This exam was performed according to the departmental dose-optimization program which includes automated exposure control, adjustment of the mA and/or kV according to patient size and/or use of iterative reconstruction technique. COMPARISON:  Head CT 03/10/2019 FINDINGS: Brain: Stable position of right approach ventriculostomy catheter  terminating in the right lateral ventricle. The ventricular system is unchanged in size. Unchanged hypoattenuation along the catheter tract without evidence of tract hemorrhage. Unchanged right frontal and left cerebellar encephalomalacia.Unchanged mild prominence of the extra-axial spaces proportional to degree of cerebral atrophy. No acute extra-axial collection.No new loss of gray-white matter differentiation. Vascular: Vascular calcifications.  No hyperdense vessel. Skull: Prior suboccipital craniotomy. Negative for acute skull fracture. Sinuses/Orbits: Opacification of the left frontal sinus and left anterior ethmoid air cells. Frothy material in the maxillary sinuses and left sphenoid sinus. Right mastoid effusion. The sinus disease has progressed since the prior exam. Orbits are unremarkable. Other: None. IMPRESSION: No acute intracranial abnormality. Stable postoperative and chronic ischemic changes. Stable right-sided ventriculostomy catheter and size of the ventricular system. Electronically Signed   By: Maurine Simmering M.D.   On: 08/15/2021 11:09   DG Cervical Spine 1 View  Result Date: 08/15/2021 CLINICAL DATA:  Altered mental status.  Assess VP shunt EXAM: SHUNT SERIES (AP & LAT SKULL, AP NECK, AP CHEST, AP ABDOMEN) COMPARISON:  CT 03/10/2019 FINDINGS: Right parietal approach ventriculostomy catheter is again seen with distal tip terminating just to the right of midline. Catheter descends the right neck and right chest wall and coils within the pelvis before terminating in the lower right  abdomen. No catheter kinking or discontinuity. Postsurgical changes noted from prior suboccipital craniotomy. Stable heart size. Lungs are clear. No pneumothorax. Bowel gas pattern is nonobstructive. Moderate volume of stool within the colon and rectum. IMPRESSION: Right parietal approach ventriculostomy catheter terminates in the lower right abdomen. No catheter kinking or discontinuity. Electronically Signed   By:  Davina Poke D.O.   On: 08/15/2021 11:01   DG Chest 1 View  Result Date: 08/15/2021 CLINICAL DATA:  Altered mental status.  Assess VP shunt EXAM: SHUNT SERIES (AP & LAT SKULL, AP NECK, AP CHEST, AP ABDOMEN) COMPARISON:  CT 03/10/2019 FINDINGS: Right parietal approach ventriculostomy catheter is again seen with distal tip terminating just to the right of midline. Catheter descends the right neck and right chest wall and coils within the pelvis before terminating in the lower right abdomen. No catheter kinking or discontinuity. Postsurgical changes noted from prior suboccipital craniotomy. Stable heart size. Lungs are clear. No pneumothorax. Bowel gas pattern is nonobstructive. Moderate volume of stool within the colon and rectum. IMPRESSION: Right parietal approach ventriculostomy catheter terminates in the lower right abdomen. No catheter kinking or discontinuity. Electronically Signed   By: Davina Poke D.O.   On: 08/15/2021 11:01   DG Abd 1 View  Result Date: 08/15/2021 CLINICAL DATA:  Altered mental status.  Assess VP shunt EXAM: SHUNT SERIES (AP & LAT SKULL, AP NECK, AP CHEST, AP ABDOMEN) COMPARISON:  CT 03/10/2019 FINDINGS: Right parietal approach ventriculostomy catheter is again seen with distal tip terminating just to the right of midline. Catheter descends the right neck and right chest wall and coils within the pelvis before terminating in the lower right abdomen. No catheter kinking or discontinuity. Postsurgical changes noted from prior suboccipital craniotomy. Stable heart size. Lungs are clear. No pneumothorax. Bowel gas pattern is nonobstructive. Moderate volume of stool within the colon and rectum. IMPRESSION: Right parietal approach ventriculostomy catheter terminates in the lower right abdomen. No catheter kinking or discontinuity. Electronically Signed   By: Davina Poke D.O.   On: 08/15/2021 11:01   DG Skull 1-3 Views  Result Date: 08/15/2021 CLINICAL DATA:  Altered mental  status.  Assess VP shunt EXAM: SHUNT SERIES (AP & LAT SKULL, AP NECK, AP CHEST, AP ABDOMEN) COMPARISON:  CT 03/10/2019 FINDINGS: Right parietal approach ventriculostomy catheter is again seen with distal tip terminating just to the right of midline. Catheter descends the right neck and right chest wall and coils within the pelvis before terminating in the lower right abdomen. No catheter kinking or discontinuity. Postsurgical changes noted from prior suboccipital craniotomy. Stable heart size. Lungs are clear. No pneumothorax. Bowel gas pattern is nonobstructive. Moderate volume of stool within the colon and rectum. IMPRESSION: Right parietal approach ventriculostomy catheter terminates in the lower right abdomen. No catheter kinking or discontinuity. Electronically Signed   By: Davina Poke D.O.   On: 08/15/2021 11:01    Labs:  CBC: Recent Labs    03/09/21 0000 08/15/21 0905 08/29/21 1149 08/30/21 0140  WBC 14.1* 17.4* 17.3* 16.3*  HGB 10.8* 9.9* 10.9* 9.0*  HCT 34.2* 32.4* 37.1* 29.4*  PLT 77* 119* 117* 106*    COAGS: No results for input(s): "INR", "APTT" in the last 8760 hours.  BMP: Recent Labs    03/09/21 0000 08/15/21 0905 08/29/21 1149 08/30/21 0140  NA 138 134* 139 136  K 4.2 4.5 5.3* 4.7  CL 105 104 109 109  CO2 25 26 21* 23  GLUCOSE 126* 109* 93 78  BUN 21  $'13 14 13  'q$ CALCIUM 8.6 8.1* 8.5* 8.1*  CREATININE 1.19 1.04 1.06 0.96  GFRNONAA  --  >60 >60 >60    LIVER FUNCTION TESTS: Recent Labs    03/09/21 0000 08/15/21 0905  BILITOT 0.4 0.1*  AST 14 17  ALT 8* 12  ALKPHOS  --  53  PROT 8.7* 8.4*  ALBUMIN  --  2.6*    TUMOR MARKERS: No results for input(s): "AFPTM", "CEA", "CA199", "CHROMGRNA" in the last 8760 hours.  Assessment and Plan: History of HTN, dementia, [place] 2006, AVM resection 2007 and hydrocephalus s/p VP shunt. Pt was found unresponsive at his adult daycare. Pt was transported vis EMS to [facility] ED. Workup was negative for infection, CT brain  negative with VP shunt normal. Family reported pt has 10-15 lbs weight loss over last 6 months. Pt CT angio for PE d/t positive d-dimer was negative for PE but resulted bilateral axillary, hilar, mediastinal and upper abdominal adenopathy concerning for lymphoma or metastatic disease. Dr. Jamse Arn referred pt to IR for lymph node biopsy. Dr. Kathlene Cote approved L axillary/supraclavicular lymph node biopsy with sedation. Procedure tentatively scheduled for 09/03/21. If pt is d/c prior, he will need to be rescheduled for OP biopsy.   Pt resting in bed with daughter at bedside and wife on speaker phone. He is in no distress.  Family made aware that procedure will be planned for July 17th d/t full schedule in IR. Daughter advised that pt will be NPO for procedure.    Risks and benefits of left axillary/supraclavicular lymph node biopsy with moderate sedation was discussed with the patient and/or patient's family including, but not limited to bleeding, infection, damage to adjacent structures or low yield requiring additional tests.  All of the questions were answered and there is agreement to proceed.  Consent signed and in IR.  Thank you for this interesting consult.  I greatly enjoyed meeting Jeffrey Huerta and look forward to participating in their care.  A copy of this report was sent to the requesting provider on this date.  Electronically Signed: Tyson Alias, NP 08/30/2021, 1:03 PM   I spent a total of 20 minutes in face to face in clinical consultation, greater than 50% of which was counseling/coordinating care for axillary and mediastinal lymphadenopathy.

## 2021-08-31 ENCOUNTER — Observation Stay (HOSPITAL_COMMUNITY): Payer: Medicare Other

## 2021-08-31 DIAGNOSIS — R55 Syncope and collapse: Secondary | ICD-10-CM | POA: Diagnosis not present

## 2021-08-31 DIAGNOSIS — R569 Unspecified convulsions: Secondary | ICD-10-CM

## 2021-08-31 LAB — CBC
HCT: 29.3 % — ABNORMAL LOW (ref 39.0–52.0)
Hemoglobin: 9 g/dL — ABNORMAL LOW (ref 13.0–17.0)
MCH: 29.9 pg (ref 26.0–34.0)
MCHC: 30.7 g/dL (ref 30.0–36.0)
MCV: 97.3 fL (ref 80.0–100.0)
Platelets: 101 10*3/uL — ABNORMAL LOW (ref 150–400)
RBC: 3.01 MIL/uL — ABNORMAL LOW (ref 4.22–5.81)
RDW: 15.9 % — ABNORMAL HIGH (ref 11.5–15.5)
WBC: 16 10*3/uL — ABNORMAL HIGH (ref 4.0–10.5)
nRBC: 0 % (ref 0.0–0.2)

## 2021-08-31 LAB — BASIC METABOLIC PANEL
Anion gap: 8 (ref 5–15)
BUN: 15 mg/dL (ref 8–23)
CO2: 23 mmol/L (ref 22–32)
Calcium: 7.9 mg/dL — ABNORMAL LOW (ref 8.9–10.3)
Chloride: 108 mmol/L (ref 98–111)
Creatinine, Ser: 1.15 mg/dL (ref 0.61–1.24)
GFR, Estimated: >60 mL/min (ref >60)
Glucose, Bld: 82 mg/dL (ref 70–99)
Potassium: 4.5 mmol/L (ref 3.5–5.1)
Sodium: 139 mmol/L (ref 135–145)

## 2021-08-31 LAB — PATHOLOGIST SMEAR REVIEW

## 2021-08-31 NOTE — Consult Note (Signed)
Neurology Consultation  Jeffrey Huerta MR# [id number] 08/31/2021    CC: episode of unresponsiveness at memory care program  History is obtained from: family and chart  HPI: Jeffrey Huerta is a 76 y.o. 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  Yesterday, when he was at his memory care program, he was sitting in a chair and became unresponsive. He continued to breathe at this time per program personnel, and EMS was called.  Upon EMS arrival, his blood pressure was 546/50 (systolic is normally in the 120s- 130s per family) with heart rate of 48.  Patient was recently seen for gait abnormalities and altered mental status two weeks ago, but his workup was unrevealing.  Family states that patient often complains of tiredness and that he has lost some weight in the last 6 months without changes in appetite.     ROS: Unable to obtain due to altered mental status.   Past Medical History:  Diagnosis Date   Aneurysm ([place] [facility])    AVM (arteriovenous malformation)    Dementia ([place])    Hydrocephalus ([facility])    VP shunt in place   Hypertension      Family History  Problem Relation Age of Onset   Stroke Mother    Hypertension Mother      Social History:  reports that he has never smoked. He has never used smokeless tobacco. He reports that he does not drink alcohol and does not use drugs.   Prior to Admission medications   Medication Sig Start Date End Date Taking? Authorizing Provider  acetaminophen (TYLENOL) 500 MG tablet Take 500 mg by mouth daily as needed for mild pain (hand cramps).   Yes [provider]     Exam: Current vital signs: BP 132/74 (BP Location: Right Arm)   Pulse 66   Temp 97.7 F (36.5 C) (Oral)   Resp 17   Ht '5\' 3"'$  (1.6 m)   Wt 58.2 kg   SpO2 92%   BMI 22.73 kg/m    Physical Exam  Constitutional: Appears well-developed and  well-nourished.  Psych: Affect appropriate to situation Eyes: No scleral injection HENT: No OP obstrucion Head: Normocephalic.  Cardiovascular: Normal rate and regular rhythm.  Respiratory: Effort normal, non-labored breathing GI: Soft.  No distension. There is no tenderness.  Skin: WDI  Neuro: Mental Status: Patient is awake, alert, oriented to person only, pleasantly confused and able to follow simple commands Patient is not able to give a clear and coherent history. No signs of aphasia or neglect Cranial Nerves: II: Visual Fields are full. Pupils are equal, round, and reactive to light.   III,IV, VI: EOMI without ptosis or diplopia.  V: Facial sensation is symmetric to light touch VII: Facial movement is symmetric.  VIII: hearing is intact to voice XII: tongue is midline without atrophy or fasciculations.  Motor: Tone is normal. Bulk is normal. 5/5 strength was present in all four extremities.  Sensory: Sensation is symmetric to light touch in the arms and legs. Deep Tendon Reflexes: 2+ and symmetric in the biceps and patellae.  Plantars: Toes are downgoing bilaterally.  Cerebellar: FNF are intact bilaterally   I have reviewed labs in epic and the pertinent results are:  Lab Results  Component Value Date/Time   CHOL 156 03/09/2021 12:00 AM    Results for orders placed or performed during the hospital encounter of 08/29/21 (  from the past 48 hour(s))  Urinalysis, Routine w reflex microscopic Urine, Clean Catch     Status: None   Collection Time: 08/29/21  4:13 PM  Result Value Ref Range   Color, Urine YELLOW YELLOW   APPearance CLEAR CLEAR   Specific Gravity, Urine 1.020 1.005 - 1.030   pH 5.0 5.0 - 8.0   Glucose, UA NEGATIVE NEGATIVE mg/dL   Hgb urine dipstick NEGATIVE NEGATIVE   Bilirubin Urine NEGATIVE NEGATIVE   Ketones, ur NEGATIVE NEGATIVE mg/dL   Protein, ur NEGATIVE NEGATIVE mg/dL   Nitrite NEGATIVE NEGATIVE   Leukocytes,Ua NEGATIVE NEGATIVE     Comment: Performed at [facility] [address], [place], [place] [address]  Lactic acid, plasma     Status: None   Collection Time: 08/29/21  4:16 PM  Result Value Ref Range   Lactic Acid, Venous 1.4 0.5 - 1.9 mmol/L    Comment: Performed at [facility] [address] [place], [place] [address]  Culture, blood (single) w Reflex to ID Panel     Status: None (Preliminary result)   Collection Time: 08/29/21  4:16 PM   Specimen: BLOOD  Result Value Ref Range   Specimen Description BLOOD SITE NOT SPECIFIED    Special Requests      BOTTLES DRAWN AEROBIC AND ANAEROBIC Blood Culture results may not be optimal due to an inadequate volume of blood received in culture bottles   Culture      NO GROWTH 2 DAYS Performed at [facility], [place] [address] [place], [place] [address]    Report Status PENDING   D-dimer, quantitative     Status: Abnormal   Collection Time: 08/29/21  6:20 PM  Result Value Ref Range   D-Dimer, Quant 0.92 (H) 0.00 - 0.50 ug/mL-FEU    Comment: (NOTE) At the manufacturer cut-off value of 0.5 g/mL FEU, this assay has a negative predictive value of 95-100%.This assay is intended for use in conjunction with a clinical pretest probability (PTP) assessment model to exclude pulmonary embolism (PE) and deep venous thrombosis (DVT) in outpatients suspected of PE or DVT. Results should be correlated with clinical presentation. Performed at [facility], [place] [address] [place], [place] [address]   CBC     Status: Abnormal   Collection Time: 08/30/21  1:40 AM  Result Value Ref Range   WBC 16.3 (H) 4.0 - 10.5 K/uL   RBC 3.04 (L) 4.22 - 5.81 MIL/uL   Hemoglobin 9.0 (L) 13.0 - 17.0 g/dL   HCT 29.4 (L) 39.0 - 52.0 %   MCV 96.7 80.0 - 100.0 fL   MCH 29.6 26.0 - 34.0 pg   MCHC 30.6 30.0 - 36.0 g/dL   RDW 15.9 (H) 11.5 - 15.5 %   Platelets 106 (L) 150 - 400 K/uL    Comment: Immature Platelet Fraction may be clinically indicated, consider ordering  this additional test [id number] REPEATED TO VERIFY    nRBC 0.0 0.0 - 0.2 %    Comment: Performed at [facility], [place] [address] [place],  [address]  Basic metabolic panel     Status: Abnormal   Collection Time: 08/30/21  1:40 AM  Result Value Ref Range   Sodium 136 135 - 145 mmol/L   Potassium 4.7 3.5 - 5.1 mmol/L   Chloride 109 98 - 111 mmol/L   CO2 23 22 - 32 mmol/L   Glucose, Bld 78 70 - 99 mg/dL  Comment: Glucose reference range applies only to samples taken after fasting for at least 8 hours.   BUN 13 8 - 23 mg/dL   Creatinine, Ser 0.96 0.61 - 1.24 mg/dL   Calcium 8.1 (L) 8.9 - 10.3 mg/dL   GFR, Estimated >60 >60 mL/min    Comment: (NOTE) Calculated using the CKD-EPI Creatinine Equation (2021)    Anion gap 4 (L) 5 - 15    Comment: Performed at [facility] [address], [place], [place] [address]  Hemoglobin and hematocrit, blood     Status: Abnormal   Collection Time: 08/30/21  8:17 PM  Result Value Ref Range   Hemoglobin 8.8 (L) 13.0 - 17.0 g/dL   HCT 29.7 (L) 39.0 - 52.0 %    Comment: Performed at [facility] [address], [place], [place] [address]  Basic metabolic panel     Status: Abnormal   Collection Time: 08/31/21  2:31 AM  Result Value Ref Range   Sodium 139 135 - 145 mmol/L   Potassium 4.5 3.5 - 5.1 mmol/L   Chloride 108 98 - 111 mmol/L   CO2 23 22 - 32 mmol/L   Glucose, Bld 82 70 - 99 mg/dL    Comment: Glucose reference range applies only to samples taken after fasting for at least 8 hours.   BUN 15 8 - 23 mg/dL   Creatinine, Ser 1.15 0.61 - 1.24 mg/dL   Calcium 7.9 (L) 8.9 - 10.3 mg/dL   GFR, Estimated >60 >60 mL/min    Comment: (NOTE) Calculated using the CKD-EPI Creatinine Equation (2021)    Anion gap 8 5 - 15    Comment: Performed at [facility] [address], [place], [place] [address]  CBC     Status: Abnormal   Collection Time: 08/31/21  2:31 AM  Result Value Ref Range   WBC 16.0 (H) 4.0 - 10.5 K/uL    RBC 3.01 (L) 4.22 - 5.81 MIL/uL   Hemoglobin 9.0 (L) 13.0 - 17.0 g/dL   HCT 29.3 (L) 39.0 - 52.0 %   MCV 97.3 80.0 - 100.0 fL   MCH 29.9 26.0 - 34.0 pg   MCHC 30.7 30.0 - 36.0 g/dL   RDW 15.9 (H) 11.5 - 15.5 %   Platelets 101 (L) 150 - 400 K/uL    Comment: Immature Platelet Fraction may be clinically indicated, consider ordering this additional test [id number] REPEATED TO VERIFY    nRBC 0.0 0.0 - 0.2 %    Comment: Performed at [facility], [place] [address] [place], [place] [address]    ECHOCARDIOGRAM COMPLETE  Result Date: 08/30/2021    ECHOCARDIOGRAM REPORT   Patient Name:   GRYPHON VANDERVEEN Date of Exam: 08/30/2021 Medical Rec #:  [id number]  Height:       63.0 in Accession #:    [id number] Weight:       128.3 lb Date of Birth:  10/30/1945  BSA:          1.601 m Patient Age:    72 years   BP:           139/73 mmHg Patient Gender: M          HR:           82 bpm. Exam Location:  Inpatient Procedure: 2D Echo, Cardiac Doppler and Color Doppler Indications:    Syncope  History:        Patient has prior history of Echocardiogram  examinations, most                 recent 02/21/2010. Risk Factors:Hypertension. Dementia.  Sonographer:    Joette Catching RCS Referring Phys: [phone] RONDELL A Stall  Sonographer Comments: Technically challenging study due to limited acoustic windows and no parasternal window. Image acquisition challenging due to patient body habitus. IMPRESSIONS  1. Left ventricular ejection fraction, by estimation, is 60 to 65%. The left ventricle has normal function. The left ventricle has no regional wall motion abnormalities. Left ventricular diastolic parameters are consistent with Grade I diastolic dysfunction (impaired relaxation).  2. Right ventricular systolic function is normal. The right ventricular size is normal. Tricuspid regurgitation signal is inadequate for assessing PA pressure.  3. The mitral valve is normal in structure. No evidence of mitral valve regurgitation. No  evidence of mitral stenosis.  4. The aortic valve is tricuspid. There is mild calcification of the aortic valve. Aortic valve regurgitation is not visualized. No aortic stenosis is present.  5. The inferior vena cava is normal in size with greater than 50% respiratory variability, suggesting right atrial pressure of 3 mmHg. FINDINGS  Left Ventricle: Left ventricular ejection fraction, by estimation, is 60 to 65%. The left ventricle has normal function. The left ventricle has no regional wall motion abnormalities. The left ventricular internal cavity size was normal in size. There is  no left ventricular hypertrophy. Left ventricular diastolic parameters are consistent with Grade I diastolic dysfunction (impaired relaxation). Right Ventricle: The right ventricular size is normal. No increase in right ventricular wall thickness. Right ventricular systolic function is normal. Tricuspid regurgitation signal is inadequate for assessing PA pressure. Left Atrium: Left atrial size was normal in size. Right Atrium: Right atrial size was normal in size. Pericardium: There is no evidence of pericardial effusion. Mitral Valve: The mitral valve is normal in structure. No evidence of mitral valve regurgitation. No evidence of mitral valve stenosis. Tricuspid Valve: The tricuspid valve is normal in structure. Tricuspid valve regurgitation is not demonstrated. Aortic Valve: The aortic valve is tricuspid. There is mild calcification of the aortic valve. Aortic valve regurgitation is not visualized. No aortic stenosis is present. Aortic valve mean gradient measures 5.0 mmHg. Aortic valve peak gradient measures 7.3 mmHg. Aortic valve area, by VTI measures 1.98 cm. Pulmonic Valve: The pulmonic valve was normal in structure. Pulmonic valve regurgitation is not visualized. Aorta: The aortic root and ascending aorta are structurally normal, with no evidence of dilitation. Venous: The inferior vena cava is normal in size with greater than  50% respiratory variability, suggesting right atrial pressure of 3 mmHg. IAS/Shunts: No atrial level shunt detected by color flow Doppler.  LEFT VENTRICLE PLAX 2D LVOT diam:     1.60 cm     Diastology LV SV:         42          LV e' medial:    9.36 cm/s LV SV Index:   26          LV E/e' medial:  7.5 LVOT Area:     2.01 cm    LV e' lateral:   11.70 cm/s                            LV E/e' lateral: 6.0  LV Volumes (MOD) LV vol d, MOD A2C: 39.9 ml LV vol d, MOD A4C: 51.9 ml LV vol s, MOD A2C: 22.1 ml LV vol s, MOD  A4C: 19.3 ml LV SV MOD A2C:     17.8 ml LV SV MOD A4C:     51.9 ml LV SV MOD BP:      27.9 ml RIGHT VENTRICLE             IVC RV Basal diam:  2.90 cm     IVC diam: 1.40 cm RV Mid diam:    1.80 cm RV S prime:     16.90 cm/s TAPSE (M-mode): 1.7 cm LEFT ATRIUM             Index        RIGHT ATRIUM          Index LA Vol (A2C):   13.0 ml 8.12 ml/m   RA Area:     7.74 cm LA Vol (A4C):   26.4 ml 16.49 ml/m  RA Volume:   12.00 ml 7.49 ml/m LA Biplane Vol: 19.3 ml 12.05 ml/m  AORTIC VALVE                     PULMONIC VALVE AV Area (Vmax):    1.76 cm      PV Vmax:       1.20 m/s AV Area (Vmean):   1.75 cm      PV Peak grad:  5.8 mmHg AV Area (VTI):     1.98 cm AV Vmax:           135.00 cm/s AV Vmean:          103.000 cm/s AV VTI:            0.210 m AV Peak Grad:      7.3 mmHg AV Mean Grad:      5.0 mmHg LVOT Vmax:         118.00 cm/s LVOT Vmean:        89.600 cm/s LVOT VTI:          0.207 m LVOT/AV VTI ratio: 0.99 MITRAL VALVE MV Area (PHT): 5.70 cm    SHUNTS MV Decel Time: 133 msec    Systemic VTI:  0.21 m MV E velocity: 70.30 cm/s  Systemic Diam: 1.60 cm MV A velocity: 94.70 cm/s MV E/A ratio:  0.74 Dalton McleanMD Electronically signed by Franki Monte Signature Date/Time: 08/30/2021/3:26:27 PM    Final    CT Angio Chest Pulmonary Embolism (PE) W or WO Contrast  Result Date: 08/30/2021 CLINICAL DATA:  Pulmonary embolism (PE) suspected, positive D-dimer EXAM: CT ANGIOGRAPHY CHEST WITH CONTRAST  TECHNIQUE: Multidetector CT imaging of the chest was performed using the standard protocol during bolus administration of intravenous contrast. Multiplanar CT image reconstructions and MIPs were obtained to evaluate the vascular anatomy. RADIATION DOSE REDUCTION: This exam was performed according to the departmental dose-optimization program which includes automated exposure control, adjustment of the mA and/or kV according to patient size and/or use of iterative reconstruction technique. CONTRAST:  70m OMNIPAQUE IOHEXOL 350 MG/ML SOLN COMPARISON:  None Available. FINDINGS: Cardiovascular: No filling defects in the pulmonary arteries to suggest pulmonary emboli. Heart is normal size. Scattered coronary artery and aortic calcifications. Ascending thoracic aorta mildly dilated at 4.1 cm. Mediastinum/Nodes: Bilateral axillary adenopathy. Index right axillary lymph node has a short axis diameter of 12 mm. Index left axillary lymph node has a short axis diameter of 18 mm. Mediastinal adenopathy. AP window lymph node has a short axis diameter of 11 mm. Right paratracheal lymph node has a short axis diameter of 9 mm. Bilateral hilar adenopathy with  index right hilar lymph having a short axis diameter of 16 mm. Trachea and esophagus are unremarkable. Thyroid unremarkable. Lungs/Pleura: Dependent and bibasilar atelectasis.  No effusions. Upper Abdomen: Upper abdominal adenopathy noted. Musculoskeletal: Chest wall soft tissues are unremarkable. No acute bony abnormality. Review of the MIP images confirms the above findings. IMPRESSION: No evidence of pulmonary embolus. Bilateral axillary, hilar, mediastinal and upper abdominal adenopathy concerning for lymphoma or metastatic disease. Coronary artery disease. 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 Recommend annual imaging followup by CTA or MRA. This recommendation follows 2010 ACCF/AHA/AATS/ACR/ASA/SCA/SCAI/SIR/STS/SVM Guidelines for the Diagnosis and Management of  Patients with Thoracic Aortic Disease. Circulation. 2010; 121: D428-J681. Aortic aneurysm NOS (ICD10-I71.9) Aortic Atherosclerosis (ICD10-I70.0). Electronically Signed   By: Rolm Baptise M.D.   On: 08/30/2021 01:44     I have reviewed the images obtained: CT head: Small subdural fluid collections, likely representing subdural hematomas or hygromas, ventriculostomy catheter in place, chronic microvascular ischemic disease  CT Angio chest: bilateral lymphadenopathy concerning for lymphoma or metastatic disease, 4.1 cm ascending thoracic aortic aneurysm  Assessment:  76 year old patient with history of Alzheimer's disease became transiently unresponsive while sitting in a chair at his memory care program.  Exam is nonfocal, and patient has been hemodynamically stable since admission.  Will check EEG to detect seizure activity as a possible cause of his unresponsiveness.  Patient's recent statements of tiredness, confusion and gait disturbances may well be part of the course of his dementia, and his episode of unresponsiveness may be a behavioral manifestation of his progressive dementia.  Impression: Seizure activity vs. Syncopal episode vs. Behavioral disturbance associated with dementia  Recommendations: 1) EEG to rule out seizure activity 2) Continue cardiac monitoring 3) Consider outpatient cardiac monitoring to detect dysrhythmia  4) CT head does not show acute changes or concern for shunt malfunction - do not strongly feel that he needs MRI as he has no focal findings.  Will follow EEG with you.  [place] , MSN, AGACNP-BC [facility] See Amion for schedule and pager information 08/31/2021 3:37 PM    Attending Neurohospitalist Addendum Patient seen and examined with APP/Resident. Agree with the history and physical as documented above. Agree with the plan as documented, which I helped formulate. I have independently reviewed the chart, obtained history, review  of systems and examined the patient.I have personally reviewed pertinent head/neck/spine imaging (CT/MRI). Please feel free to call with any questions.  -- Amie [place], MD Neurologist [facility] Pager: [phone]

## 2021-08-31 NOTE — Procedures (Signed)
Patient Name: Jeffrey Huerta  MRN: [id number]  Epilepsy Attending: Lora Havens  Referring Physician/Provider: Vanna [place], MD Date: 08/31/2021 Duration: 25.15 mins  Patient history: 76 year old patient with history of Alzheimer's disease became transiently unresponsive while sitting in a chair at his memory care program. EEG to evaluate for seizure  Level of alertness: Awake  AEDs during EEG study: None  Technical aspects: This EEG study was done with scalp electrodes positioned according to the 10-20 International system of electrode placement. Electrical activity was acquired at a sampling rate of '500Hz'$  and reviewed with a high frequency filter of '70Hz'$  and a low frequency filter of '1Hz'$ . EEG data were recorded continuously and digitally stored.   Description: The posterior dominant rhythm consists of 8 Hz activity of moderate voltage (25-35 uV) seen predominantly in posterior head regions, symmetric and reactive to eye opening and eye closing. EEG showed continuous 3 to 6 Hz theta-delta slowing in right fronto-central region. Photic driving was not seen during photic stimulation. Hyperventilation was not performed.     ABNORMALITY - Continuous slow, right fronto-central region.   IMPRESSION: This study is suggestive of cortical dysfunction arising from right fronto-central region, likely secondary to underlying structural abnormality. No seizures or epileptiform discharges were seen throughout the recording.  Rosealee Recinos Barbra Sarks

## 2021-08-31 NOTE — Progress Notes (Addendum)
PROGRESS NOTE    Jeffrey Huerta  UMP:[id number]  DOB: Apr 20, 1945  DOA: 08/29/2021 PCP: Nolene Ebbs, MD Outpatient Specialists:   Hospital course:  76 y.o. male with medical history significant of hypertension, dementia, [place] 2006, AVM resection 2007, hydrocephalus s/p VP shunt was admitted for syncope/episode of unresponsiveness while at adult daycare on 08/29/2021.  Of note patient had had a similar episode of altered awareness on 08/15/2021 and was seen in the ED and discharged home.  Work-up in the ED including CT scan of the brain and imaging of the VP shunt which showed no acute abnormality.  History  7/14 Workup thus far unrevealing, discussed possibility of seizures given neuro history, d/w Neurology, appreciate their input and guidance.   Subjective: Daughter on phone wife at bedside. Family somewhat frustrated that workup somewhat unrevealing and unable to attribute for his syncopal episodes. Bedside RN present throughout AM rounds.   Objective: Vitals:   08/30/21 2112 08/31/21 0056 08/31/21 0550 08/31/21 0855  BP: 127/73 (!) 142/76 138/73 132/74  Pulse: 65 62 (!) 58 66  Resp: '18 18 17 17  '$ Temp: 98.5 F (36.9 C) 98.6 F (37 C) 98.3 F (36.8 C) 97.7 F (36.5 C)  TempSrc: Oral Oral Oral Oral  SpO2: 100% 99% 98% 92%  Weight:      Height:        Intake/Output Summary (Last 24 hours) at 08/31/2021 1332 Last data filed at 08/31/2021 0857 Gross per 24 hour  Intake 1934.98 ml  Output 225 ml  Net 1709.98 ml    Filed Weights   08/29/21 0959  Weight: 58.2 kg     Exam:  General: Comfortable man lying in bed in NAD with attentive wife at bedside, patient has somewhat of a blank affect. Eyes: sclera anicteric, conjuctiva mild injection bilaterally CVS: S1-S2, regular  Respiratory:  decreased air entry bilaterally secondary to decreased inspiratory effort, rales at bases  GI: NABS, soft, NT  LE: No edema.    Assessment & Plan:   Syncope Patient was apparently  unresponsive while sitting in a chair at his adult daycare He had a similar episode of transient alteration of consciousness 2 weeks ago VP shunt and head CT are within normal limits Patient does have bradycardia, continue patient on telemetry--Patricia Abagail Kitchens was contacted about possible outpatient cardiac monitoring if warranted upon discharge. Echocardiogram done below shows no valvular abnormalities and normal EF Repeat CBC today however shows a decreased hemoglobin since yesterday although patient remains hemodynamically stable. H&H--stable, guaiac stool pending Neurology evaluation given possible seizure or seizure like episodes  Incidental adenopathy on CT Discussed with patient and family who would like to pursue further work-up IR consult placed, plan is for axillary/super clavicular biopsy on 7/17 which will need to be rescheduled as an outpatient procedure if patient is discharged before 09/03/2021. Patient PCP to follow-up on results if done as an outpatient. D/w family utility of pursuing biopsy if they choose to, they tell me they would like to continue seeking biopsy and further information even though if proven cancer they would not want chemotherapy.   Leukocytosis Very well may be related to diffuse adenopathy, biopsy plans as above No evidence of infection  Alzheimer's disease Delirium precautions  VP shunt Placed 10 years ago after [facility] and AVM   DVT prophylaxis: Lovenox Code Status: Full Family Communication: Discussion with patient's wife and daughter Tammy Disposition Plan:   Patient is from: Home  Anticipated Discharge Location: Home  Barriers to Discharge: Undergoing work-up for  syncope  Is patient medically stable for Discharge: Not yet   Scheduled Meds:  enoxaparin (LOVENOX) injection  40 mg Subcutaneous Daily   sodium chloride flush  3 mL Intravenous Q12H   Continuous Infusions:  sodium chloride 75 mL/hr at 08/31/21 0404    Data Reviewed:  Basic  Metabolic Panel: Recent Labs  Lab 08/29/21 1149 08/30/21 0140 08/31/21 0231  NA 139 136 139  K 5.3* 4.7 4.5  CL 109 109 108  CO2 21* 23 23  GLUCOSE 93 78 82  BUN '14 13 15  '$ CREATININE 1.06 0.96 1.15  CALCIUM 8.5* 8.1* 7.9*     CBC: Recent Labs  Lab 08/29/21 1149 08/30/21 0140 08/30/21 2017 08/31/21 0231  WBC 17.3* 16.3*  --  16.0*  NEUTROABS 2.0  --   --   --   HGB 10.9* 9.0* 8.8* 9.0*  HCT 37.1* 29.4* 29.7* 29.3*  MCV 103.1* 96.7  --  97.3  PLT 117* 106*  --  101*     Studies: ECHOCARDIOGRAM COMPLETE  Result Date: 08/30/2021    ECHOCARDIOGRAM REPORT   Patient Name:   Jeffrey Huerta Date of Exam: 08/30/2021 Medical Rec #:  [id number]  Height:       63.0 in Accession #:    [id number] Weight:       128.3 lb Date of Birth:  [birth year]  BSA:          1.601 m Patient Age:    67 years   BP:           139/73 mmHg Patient Gender: M          HR:           82 bpm. Exam Location:  Inpatient Procedure: 2D Echo, Cardiac Doppler and Color Doppler Indications:    Syncope  History:        Patient has prior history of Echocardiogram examinations, most                 recent 02/21/2010. Risk Factors:Hypertension. Dementia.  Sonographer:    Joette Catching RCS Referring Phys: [phone] RONDELL A Lesniewski  Sonographer Comments: Technically challenging study due to limited acoustic windows and no parasternal window. Image acquisition challenging due to patient body habitus. IMPRESSIONS  1. Left ventricular ejection fraction, by estimation, is 60 to 65%. The left ventricle has normal function. The left ventricle has no regional wall motion abnormalities. Left ventricular diastolic parameters are consistent with Grade I diastolic dysfunction (impaired relaxation).  2. Right ventricular systolic function is normal. The right ventricular size is normal. Tricuspid regurgitation signal is inadequate for assessing PA pressure.  3. The mitral valve is normal in structure. No evidence of mitral valve regurgitation. No  evidence of mitral stenosis.  4. The aortic valve is tricuspid. There is mild calcification of the aortic valve. Aortic valve regurgitation is not visualized. No aortic stenosis is present.  5. The inferior vena cava is normal in size with greater than 50% respiratory variability, suggesting right atrial pressure of 3 mmHg. FINDINGS  Left Ventricle: Left ventricular ejection fraction, by estimation, is 60 to 65%. The left ventricle has normal function. The left ventricle has no regional wall motion abnormalities. The left ventricular internal cavity size was normal in size. There is  no left ventricular hypertrophy. Left ventricular diastolic parameters are consistent with Grade I diastolic dysfunction (impaired relaxation). Right Ventricle: The right ventricular size is normal. No increase in right ventricular wall thickness. Right ventricular systolic function is normal. Tricuspid  regurgitation signal is inadequate for assessing PA pressure. Left Atrium: Left atrial size was normal in size. Right Atrium: Right atrial size was normal in size. Pericardium: There is no evidence of pericardial effusion. Mitral Valve: The mitral valve is normal in structure. No evidence of mitral valve regurgitation. No evidence of mitral valve stenosis. Tricuspid Valve: The tricuspid valve is normal in structure. Tricuspid valve regurgitation is not demonstrated. Aortic Valve: The aortic valve is tricuspid. There is mild calcification of the aortic valve. Aortic valve regurgitation is not visualized. No aortic stenosis is present. Aortic valve mean gradient measures 5.0 mmHg. Aortic valve peak gradient measures 7.3 mmHg. Aortic valve area, by VTI measures 1.98 cm. Pulmonic Valve: The pulmonic valve was normal in structure. Pulmonic valve regurgitation is not visualized. Aorta: The aortic root and ascending aorta are structurally normal, with no evidence of dilitation. Venous: The inferior vena cava is normal in size with greater than  50% respiratory variability, suggesting right atrial pressure of 3 mmHg. IAS/Shunts: No atrial level shunt detected by color flow Doppler.  LEFT VENTRICLE PLAX 2D LVOT diam:     1.60 cm     Diastology LV SV:         42          LV e' medial:    9.36 cm/s LV SV Index:   26          LV E/e' medial:  7.5 LVOT Area:     2.01 cm    LV e' lateral:   11.70 cm/s                            LV E/e' lateral: 6.0  LV Volumes (MOD) LV vol d, MOD A2C: 39.9 ml LV vol d, MOD A4C: 51.9 ml LV vol s, MOD A2C: 22.1 ml LV vol s, MOD A4C: 19.3 ml LV SV MOD A2C:     17.8 ml LV SV MOD A4C:     51.9 ml LV SV MOD BP:      27.9 ml RIGHT VENTRICLE             IVC RV Basal diam:  2.90 cm     IVC diam: 1.40 cm RV Mid diam:    1.80 cm RV S prime:     16.90 cm/s TAPSE (M-mode): 1.7 cm LEFT ATRIUM             Index        RIGHT ATRIUM          Index LA Vol (A2C):   13.0 ml 8.12 ml/m   RA Area:     7.74 cm LA Vol (A4C):   26.4 ml 16.49 ml/m  RA Volume:   12.00 ml 7.49 ml/m LA Biplane Vol: 19.3 ml 12.05 ml/m  AORTIC VALVE                     PULMONIC VALVE AV Area (Vmax):    1.76 cm      PV Vmax:       1.20 m/s AV Area (Vmean):   1.75 cm      PV Peak grad:  5.8 mmHg AV Area (VTI):     1.98 cm AV Vmax:           135.00 cm/s AV Vmean:          103.000 cm/s AV VTI:  0.210 m AV Peak Grad:      7.3 mmHg AV Mean Grad:      5.0 mmHg LVOT Vmax:         118.00 cm/s LVOT Vmean:        89.600 cm/s LVOT VTI:          0.207 m LVOT/AV VTI ratio: 0.99 MITRAL VALVE MV Area (PHT): 5.70 cm    SHUNTS MV Decel Time: 133 msec    Systemic VTI:  0.21 m MV E velocity: 70.30 cm/s  Systemic Diam: 1.60 cm MV A velocity: 94.70 cm/s MV E/A ratio:  0.74 Dalton McleanMD Electronically signed by Franki Monte Signature Date/Time: 08/30/2021/3:26:27 PM    Final    CT Angio Chest Pulmonary Embolism (PE) W or WO Contrast  Result Date: 08/30/2021 CLINICAL DATA:  Pulmonary embolism (PE) suspected, positive D-dimer EXAM: CT ANGIOGRAPHY CHEST WITH CONTRAST  TECHNIQUE: Multidetector CT imaging of the chest was performed using the standard protocol during bolus administration of intravenous contrast. Multiplanar CT image reconstructions and MIPs were obtained to evaluate the vascular anatomy. RADIATION DOSE REDUCTION: This exam was performed according to the departmental dose-optimization program which includes automated exposure control, adjustment of the mA and/or kV according to patient size and/or use of iterative reconstruction technique. CONTRAST:  6m OMNIPAQUE IOHEXOL 350 MG/ML SOLN COMPARISON:  None Available. FINDINGS: Cardiovascular: No filling defects in the pulmonary arteries to suggest pulmonary emboli. Heart is normal size. Scattered coronary artery and aortic calcifications. Ascending thoracic aorta mildly dilated at 4.1 cm. Mediastinum/Nodes: Bilateral axillary adenopathy. Index right axillary lymph node has a short axis diameter of 12 mm. Index left axillary lymph node has a short axis diameter of 18 mm. Mediastinal adenopathy. AP window lymph node has a short axis diameter of 11 mm. Right paratracheal lymph node has a short axis diameter of 9 mm. Bilateral hilar adenopathy with index right hilar lymph having a short axis diameter of 16 mm. Trachea and esophagus are unremarkable. Thyroid unremarkable. Lungs/Pleura: Dependent and bibasilar atelectasis.  No effusions. Upper Abdomen: Upper abdominal adenopathy noted. Musculoskeletal: Chest wall soft tissues are unremarkable. No acute bony abnormality. Review of the MIP images confirms the above findings. IMPRESSION: No evidence of pulmonary embolus. Bilateral axillary, hilar, mediastinal and upper abdominal adenopathy concerning for lymphoma or metastatic disease. Coronary artery disease. 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 Recommend annual imaging followup by CTA or MRA. This recommendation follows 2010 ACCF/AHA/AATS/ACR/ASA/SCA/SCAI/SIR/STS/SVM Guidelines for the Diagnosis and Management of  Patients with Thoracic Aortic Disease. Circulation. 2010; 121:: G956-O130 Aortic aneurysm NOS (ICD10-I71.9) Aortic Atherosclerosis (ICD10-I70.0). Electronically Signed   By: KRolm BaptiseM.D.   On: 08/30/2021 01:44    Principal Problem:   Syncope and collapse Active Problems:   SIRS (systemic inflammatory response syndrome) (HCC)   Dementia (HCC)   S/P VP shunt   Hyperkalemia     Jennavieve Arrick, [facility]  If 7PM-7AM, please contact night-coverage [email or web address]   LOS: 0 days

## 2021-08-31 NOTE — Progress Notes (Signed)
EEG complete - results pending 

## 2021-09-01 DIAGNOSIS — D649 Anemia, unspecified: Secondary | ICD-10-CM

## 2021-09-01 DIAGNOSIS — F0390 Unspecified dementia without behavioral disturbance: Secondary | ICD-10-CM | POA: Diagnosis not present

## 2021-09-01 DIAGNOSIS — D72820 Lymphocytosis (symptomatic): Secondary | ICD-10-CM | POA: Diagnosis not present

## 2021-09-01 DIAGNOSIS — R591 Generalized enlarged lymph nodes: Secondary | ICD-10-CM | POA: Diagnosis not present

## 2021-09-01 DIAGNOSIS — D696 Thrombocytopenia, unspecified: Secondary | ICD-10-CM

## 2021-09-01 DIAGNOSIS — R55 Syncope and collapse: Secondary | ICD-10-CM | POA: Diagnosis not present

## 2021-09-01 LAB — LACTATE DEHYDROGENASE: LDH: 269 U/L — ABNORMAL HIGH (ref 98–192)

## 2021-09-01 LAB — VITAMIN B12: Vitamin B-12: 231 pg/mL (ref 180–914)

## 2021-09-01 NOTE — Consult Note (Signed)
New Hematology/Oncology Consult   Requesting MD: Rudean Curt        Reason for Consult: Lymphocytosis, lymphadenopathy  HPI: Jeffrey Huerta presented to the emergency room 08/29/2021 via EMS after a syncope event.  A CT of the head revealed chronic subdural hematomas or hygromas.  Ventriculostomy catheter in place.  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  Large right mastoid effusion with middle ear fluid.  A CT chest revealed no evidence of pulmonary embolism.  There is bilateral axillary, hilar, mediastinal, and upper abdominal adenopathy concerning for lymphoma.  A CBC 08/29/2021 found the hemoglobin at 10.9, MCV 103.1, platelets 117,000, white count 17.3, absolute lymphocyte count 13.8, and absolute neutrophil count 2.0  He was seen in the emergency room 08/15/2021 with altered mental status.  The white count returned at 17.4, hemoglobin 9.9, and platelets 119,000.   Past Medical History:  Diagnosis Date   Aneurysm ([place])    AVM (arteriovenous malformation)    Dementia ([place])    Hydrocephalus ([place])    VP shunt in place   Hypertension   :   Past Surgical History:  Procedure Laterality Date   VENTRICULOPERITONEAL SHUNT     prior to 2008  :   Current Facility-Administered Medications:    acetaminophen (TYLENOL) tablet 650 mg, 650 mg, Oral, Q6H PRN **OR** acetaminophen (TYLENOL) suppository 650 mg, 650 mg, Rectal, Q6H PRN, Axon, Rondell A, MD   albuterol (PROVENTIL) (2.5 MG/3ML) 0.083% nebulizer solution 2.5 mg, 2.5 mg, Nebulization, Q6H PRN, Bianchini, Rondell A, MD   enoxaparin (LOVENOX) injection 40 mg, 40 mg, Subcutaneous, Daily, Deeds, Rondell A, MD, 40 mg at 09/01/21 0915   sodium chloride flush (NS) 0.9 % injection 3 mL, 3 mL, Intravenous, Q12H, Patricelli, Rondell A, MD, 3 mL at 09/01/21 1054:   enoxaparin (LOVENOX) injection  40 mg Subcutaneous Daily   sodium chloride flush  3 mL Intravenous Q12H  :  No Known Allergies:  FH:  SOCIAL HISTORY: He  lives with his wife in [place].  He attends a memory daycare Monday through Friday.  Review of Systems: Unable to obtain from patient, per family he has no history of fever, night sweats, or anorexia.    Physical Exam:  Blood pressure 130/69, pulse 60, temperature 98.4 F (36.9 C), temperature source Oral, resp. rate 17, height '5\' 3"'$  (1.6 m), weight 124 lb 8 oz (56.5 kg), SpO2 100 %.  HEENT: Oral cavity without visible mass, no thrush Lungs: Clear bilaterally Cardiac: Regular rate and rhythm Abdomen: No hepatosplenomegaly  Vascular: No leg edema Lymph nodes: 1/2-2 cm soft mobile bilateral cervical, axillary, inguinal, and femoral nodes Neurologic: Alert, moves extremities to command, not oriented to place, year, or month Skin: No rash Musculoskeletal: No spine tenderness  LABS:   Recent Labs    08/30/21 0140 08/30/21 2017 08/31/21 0231  WBC 16.3*  --  16.0*  HGB 9.0* 8.8* 9.0*  HCT 29.4* 29.7* 29.3*  PLT 106*  --  101*   Review of the peripheral blood smear from 08/29/2021: There are ovalocytes and teardrops.  No nucleated red cells.  The polychromasia is not increased.  The platelets appear mildly decreased in number.  No platelet clumps.  There are increased mature appearing lymphocytes with condensed chromatin and a small amount of cytoplasm.  Few larger lymphocytes with single nucleoli.  Some of the small lymphocytes appear to have villous or "hairy "projections, but these do not predominate.  No prolymphocytes.  Recent Labs    08/30/21 0140 08/31/21  0231  NA 136 139  K 4.7 4.5  CL 109 108  CO2 23 23  GLUCOSE 78 82  BUN 13 15  CREATININE 0.96 1.15  CALCIUM 8.1* 7.9*      RADIOLOGY:  EEG adult  Result Date: 08/31/2021 Jeffrey Havens, MD     08/31/2021 10:18 PM Patient Name: Jeffrey Huerta MRN: [id number] Epilepsy Attending: [name] Referring Physician/Provider: Vanna Scotland, MD Date: 08/31/2021 Duration: 25.15 mins Patient history: 76 year old patient  with history of Alzheimer's disease became transiently unresponsive while sitting in a chair at his memory care program. EEG to evaluate for seizure Level of alertness: Awake AEDs during EEG study: None Technical aspects: This EEG study was done with scalp electrodes positioned according to the 10-20 International system of electrode placement. Electrical activity was acquired at a sampling rate of '500Hz'$  and reviewed with a high frequency filter of '70Hz'$  and a low frequency filter of '1Hz'$ . EEG data were recorded continuously and digitally stored. Description: The posterior dominant rhythm consists of 8 Hz activity of moderate voltage (25-35 uV) seen predominantly in posterior head regions, symmetric and reactive to eye opening and eye closing. EEG showed continuous 3 to 6 Hz theta-delta slowing in right fronto-central region. Photic driving was not seen during photic stimulation. Hyperventilation was not performed.   ABNORMALITY - Continuous slow, right fronto-central region. IMPRESSION: This study is suggestive of cortical dysfunction arising from right fronto-central region, likely secondary to underlying structural abnormality. No seizures or epileptiform discharges were seen throughout the recording. Jeffrey Huerta   ECHOCARDIOGRAM COMPLETE  Result Date: 08/30/2021    ECHOCARDIOGRAM REPORT   Patient Name:   Jeffrey Huerta Date of Exam: 08/30/2021 Medical Rec #:  [id number]  Height:       63.0 in Accession #:    [id number] Weight:       128.3 lb Date of Birth:  Jul 24, 1945  BSA:          1.601 m Patient Age:    76 years   BP:           139/73 mmHg Patient Gender: M          HR:           82 bpm. Exam Location:  Inpatient Procedure: 2D Echo, Cardiac Doppler and Color Doppler Indications:    Syncope  History:        Patient has prior history of Echocardiogram examinations, most                 recent 02/21/2010. Risk Factors:Hypertension. Dementia.  Sonographer:    Joette Catching RCS Referring Phys: [phone] RONDELL A  Grilli  Sonographer Comments: Technically challenging study due to limited acoustic windows and no parasternal window. Image acquisition challenging due to patient body habitus. IMPRESSIONS  1. Left ventricular ejection fraction, by estimation, is 60 to 65%. The left ventricle has normal function. The left ventricle has no regional wall motion abnormalities. Left ventricular diastolic parameters are consistent with Grade I diastolic dysfunction (impaired relaxation).  2. Right ventricular systolic function is normal. The right ventricular size is normal. Tricuspid regurgitation signal is inadequate for assessing PA pressure.  3. The mitral valve is normal in structure. No evidence of mitral valve regurgitation. No evidence of mitral stenosis.  4. The aortic valve is tricuspid. There is mild calcification of the aortic valve. Aortic valve regurgitation is not visualized. No aortic stenosis is present.  5. The inferior vena cava is normal in size  with greater than 50% respiratory variability, suggesting right atrial pressure of 3 mmHg. FINDINGS  Left Ventricle: Left ventricular ejection fraction, by estimation, is 60 to 65%. The left ventricle has normal function. The left ventricle has no regional wall motion abnormalities. The left ventricular internal cavity size was normal in size. There is  no left ventricular hypertrophy. Left ventricular diastolic parameters are consistent with Grade I diastolic dysfunction (impaired relaxation). Right Ventricle: The right ventricular size is normal. No increase in right ventricular wall thickness. Right ventricular systolic function is normal. Tricuspid regurgitation signal is inadequate for assessing PA pressure. Left Atrium: Left atrial size was normal in size. Right Atrium: Right atrial size was normal in size. Pericardium: There is no evidence of pericardial effusion. Mitral Valve: The mitral valve is normal in structure. No evidence of mitral valve regurgitation. No  evidence of mitral valve stenosis. Tricuspid Valve: The tricuspid valve is normal in structure. Tricuspid valve regurgitation is not demonstrated. Aortic Valve: The aortic valve is tricuspid. There is mild calcification of the aortic valve. Aortic valve regurgitation is not visualized. No aortic stenosis is present. Aortic valve mean gradient measures 5.0 mmHg. Aortic valve peak gradient measures 7.3 mmHg. Aortic valve area, by VTI measures 1.98 cm. Pulmonic Valve: The pulmonic valve was normal in structure. Pulmonic valve regurgitation is not visualized. Aorta: The aortic root and ascending aorta are structurally normal, with no evidence of dilitation. Venous: The inferior vena cava is normal in size with greater than 50% respiratory variability, suggesting right atrial pressure of 3 mmHg. IAS/Shunts: No atrial level shunt detected by color flow Doppler.  LEFT VENTRICLE PLAX 2D LVOT diam:     1.60 cm     Diastology LV SV:         42          LV e' medial:    9.36 cm/s LV SV Index:   26          LV E/e' medial:  7.5 LVOT Area:     2.01 cm    LV e' lateral:   11.70 cm/s                            LV E/e' lateral: 6.0  LV Volumes (MOD) LV vol d, MOD A2C: 39.9 ml LV vol d, MOD A4C: 51.9 ml LV vol s, MOD A2C: 22.1 ml LV vol s, MOD A4C: 19.3 ml LV SV MOD A2C:     17.8 ml LV SV MOD A4C:     51.9 ml LV SV MOD BP:      27.9 ml RIGHT VENTRICLE             IVC RV Basal diam:  2.90 cm     IVC diam: 1.40 cm RV Mid diam:    1.80 cm RV S prime:     16.90 cm/s TAPSE (M-mode): 1.7 cm LEFT ATRIUM             Index        RIGHT ATRIUM          Index LA Vol (A2C):   13.0 ml 8.12 ml/m   RA Area:     7.74 cm LA Vol (A4C):   26.4 ml 16.49 ml/m  RA Volume:   12.00 ml 7.49 ml/m LA Biplane Vol: 19.3 ml 12.05 ml/m  AORTIC VALVE  PULMONIC VALVE AV Area (Vmax):    1.76 cm      PV Vmax:       1.20 m/s AV Area (Vmean):   1.75 cm      PV Peak grad:  5.8 mmHg AV Area (VTI):     1.98 cm AV Vmax:           135.00 cm/s AV  Vmean:          103.000 cm/s AV VTI:            0.210 m AV Peak Grad:      7.3 mmHg AV Mean Grad:      5.0 mmHg LVOT Vmax:         118.00 cm/s LVOT Vmean:        89.600 cm/s LVOT VTI:          0.207 m LVOT/AV VTI ratio: 0.99 MITRAL VALVE MV Area (PHT): 5.70 cm    SHUNTS MV Decel Time: 133 msec    Systemic VTI:  0.21 m MV E velocity: 70.30 cm/s  Systemic Diam: 1.60 cm MV A velocity: 94.70 cm/s MV E/A ratio:  0.74 Dalton McleanMD Electronically signed by Franki Monte Signature Date/Time: 08/30/2021/3:26:27 PM    Final    CT Angio Chest Pulmonary Embolism (PE) W or WO Contrast  Result Date: 08/30/2021 CLINICAL DATA:  Pulmonary embolism (PE) suspected, positive D-dimer EXAM: CT ANGIOGRAPHY CHEST WITH CONTRAST TECHNIQUE: Multidetector CT imaging of the chest was performed using the standard protocol during bolus administration of intravenous contrast. Multiplanar CT image reconstructions and MIPs were obtained to evaluate the vascular anatomy. RADIATION DOSE REDUCTION: This exam was performed according to the departmental dose-optimization program which includes automated exposure control, adjustment of the mA and/or kV according to patient size and/or use of iterative reconstruction technique. CONTRAST:  28m OMNIPAQUE IOHEXOL 350 MG/ML SOLN COMPARISON:  None Available. FINDINGS: Cardiovascular: No filling defects in the pulmonary arteries to suggest pulmonary emboli. Heart is normal size. Scattered coronary artery and aortic calcifications. Ascending thoracic aorta mildly dilated at 4.1 cm. Mediastinum/Nodes: Bilateral axillary adenopathy. Index right axillary lymph node has a short axis diameter of 12 mm. Index left axillary lymph node has a short axis diameter of 18 mm. Mediastinal adenopathy. AP window lymph node has a short axis diameter of 11 mm. Right paratracheal lymph node has a short axis diameter of 9 mm. Bilateral hilar adenopathy with index right hilar lymph having a short axis diameter of 16 mm.  Trachea and esophagus are unremarkable. Thyroid unremarkable. Lungs/Pleura: Dependent and bibasilar atelectasis.  No effusions. Upper Abdomen: Upper abdominal adenopathy noted. Musculoskeletal: Chest wall soft tissues are unremarkable. No acute bony abnormality. Review of the MIP images confirms the above findings. IMPRESSION: No evidence of pulmonary embolus. Bilateral axillary, hilar, mediastinal and upper abdominal adenopathy concerning for lymphoma or metastatic disease. Coronary artery disease. 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 Recommend annual imaging followup by CTA or MRA. This recommendation follows 2010 ACCF/AHA/AATS/ACR/ASA/SCA/SCAI/SIR/STS/SVM Guidelines for the Diagnosis and Management of Patients with Thoracic Aortic Disease. Circulation. 2010; 121:: W295-A213 Aortic aneurysm NOS (ICD10-I71.9) Aortic Atherosclerosis (ICD10-I70.0). Electronically Signed   By: KRolm BaptiseM.D.   On: 08/30/2021 01:44   CT HEAD WO CONTRAST  Result Date: 08/29/2021 CLINICAL DATA:  Syncope/presyncope, cerebrovascular cause suspected EXAM: CT HEAD WITHOUT CONTRAST TECHNIQUE: Contiguous axial images were obtained from the base of the skull through the vertex without intravenous contrast. RADIATION DOSE REDUCTION: This exam was performed according to the departmental dose-optimization  program which includes automated exposure control, adjustment of the mA and/or kV according to patient size and/or use of iterative reconstruction technique. COMPARISON:  CT head August 15, 2021. FINDINGS: Brain: Suspected small (probably 3-4 mm) bilateral low-density subdural fluid collections, similar to the prior. Right frontal approach ventriculostomy catheter similar hypoattenuation along the ventriculostomy tract and similar position of the tip. Visualized portions of the ventriculostomy catheter appear intact. Similar size of the ventricular system without evidence of hydrocephalus. Remote left cerebellar infarct. Probable  embolization coils in this region. No evidence of acute large vascular territory infarct. Areas of patchy hypoattenuation appears similar to prior and are nonspecific but compatible with chronic microvascular ischemic disease. Vascular: Calcific intracranial atherosclerosis. Skull: No mastoid effusions Sinuses/Orbits: Left frontal, left frontoethmoidal and anterior left ethmoid air cell opacification. Frothy secretions in the left sphenoid sinus and posterior left ethmoid air cell. Left maxillary sinus mucosal thickening. Other: Large right mastoid effusion with middle ear fluid. IMPRESSION: 1. Suspected small (probably 3-4 mm) bilateral low-density subdural fluid collections, similar to the prior. 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 No significant mass effect. An MRI could confirm and further evaluate if clinically warranted. 2. Similar size of the ventricular system without evidence of hydrocephalus. Right frontal approach ventriculostomy catheter in place. 3. 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 4. Paranasal sinus disease, described below. 5. Large right mastoid effusion with middle ear fluid. Electronically Signed   By: Margaretha Sheffield M.D.   On: 08/29/2021 11:33   DG Chest Port 1 View  Result Date: 08/29/2021 CLINICAL DATA:  loc EXAM: PORTABLE CHEST 1 VIEW COMPARISON:  None Available. FINDINGS: Again seen is a right VP shunt stable. Cardiomediastinal silhouette is upper normal. Thoracic aorta is ectatic. Lungs remain clear. The visualized skeletal structures are unremarkable. IMPRESSION: No active disease and no significant interval change. Electronically Signed   By: Frazier Richards M.D.   On: 08/29/2021 10:51   CT HEAD WO CONTRAST (5MM)  Result Date: 08/15/2021 CLINICAL DATA:  Mental status change, persistent or worsening EXAM: CT HEAD WITHOUT CONTRAST TECHNIQUE: Contiguous axial images were obtained from the base of the skull through  the vertex without intravenous contrast. RADIATION DOSE REDUCTION: This exam was performed according to the departmental dose-optimization program which includes automated exposure control, adjustment of the mA and/or kV according to patient size and/or use of iterative reconstruction technique. COMPARISON:  Head CT 03/10/2019 FINDINGS: Brain: Stable position of right approach ventriculostomy catheter terminating in the right lateral ventricle. The ventricular system is unchanged in size. Unchanged hypoattenuation along the catheter tract without evidence of tract hemorrhage. Unchanged right frontal and left cerebellar encephalomalacia.Unchanged mild prominence of the extra-axial spaces proportional to degree of cerebral atrophy. No acute extra-axial collection.No new loss of gray-white matter differentiation. Vascular: Vascular calcifications.  No hyperdense vessel. Skull: Prior suboccipital craniotomy. Negative for acute skull fracture. Sinuses/Orbits: Opacification of the left frontal sinus and left anterior ethmoid air cells. Frothy material in the maxillary sinuses and left sphenoid sinus. Right mastoid effusion. The sinus disease has progressed since the prior exam. Orbits are unremarkable. Other: None. IMPRESSION: No acute intracranial abnormality. Stable postoperative and chronic ischemic changes. Stable right-sided ventriculostomy catheter and size of the ventricular system. Electronically Signed   By: Maurine Simmering M.D.   On: 08/15/2021 11:09   DG Cervical Spine 1 View  Result Date: 08/15/2021 CLINICAL DATA:  Altered mental status.  Assess VP shunt EXAM: SHUNT SERIES (AP & LAT SKULL, AP NECK, AP  CHEST, AP ABDOMEN) COMPARISON:  CT 03/10/2019 FINDINGS: Right parietal approach ventriculostomy catheter is again seen with distal tip terminating just to the right of midline. Catheter descends the right neck and right chest wall and coils within the pelvis before terminating in the lower right abdomen. No  catheter kinking or discontinuity. Postsurgical changes noted from prior suboccipital craniotomy. Stable heart size. Lungs are clear. No pneumothorax. Bowel gas pattern is nonobstructive. Moderate volume of stool within the colon and rectum. IMPRESSION: Right parietal approach ventriculostomy catheter terminates in the lower right abdomen. No catheter kinking or discontinuity. Electronically Signed   By: Davina Poke D.O.   On: 08/15/2021 11:01   DG Chest 1 View  Result Date: 08/15/2021 CLINICAL DATA:  Altered mental status.  Assess VP shunt EXAM: SHUNT SERIES (AP & LAT SKULL, AP NECK, AP CHEST, AP ABDOMEN) COMPARISON:  CT 03/10/2019 FINDINGS: Right parietal approach ventriculostomy catheter is again seen with distal tip terminating just to the right of midline. Catheter descends the right neck and right chest wall and coils within the pelvis before terminating in the lower right abdomen. No catheter kinking or discontinuity. Postsurgical changes noted from prior suboccipital craniotomy. Stable heart size. Lungs are clear. No pneumothorax. Bowel gas pattern is nonobstructive. Moderate volume of stool within the colon and rectum. IMPRESSION: Right parietal approach ventriculostomy catheter terminates in the lower right abdomen. No catheter kinking or discontinuity. Electronically Signed   By: Davina Poke D.O.   On: 08/15/2021 11:01   DG Abd 1 View  Result Date: 08/15/2021 CLINICAL DATA:  Altered mental status.  Assess VP shunt EXAM: SHUNT SERIES (AP & LAT SKULL, AP NECK, AP CHEST, AP ABDOMEN) COMPARISON:  CT 03/10/2019 FINDINGS: Right parietal approach ventriculostomy catheter is again seen with distal tip terminating just to the right of midline. Catheter descends the right neck and right chest wall and coils within the pelvis before terminating in the lower right abdomen. No catheter kinking or discontinuity. Postsurgical changes noted from prior suboccipital craniotomy. Stable heart size. Lungs  are clear. No pneumothorax. Bowel gas pattern is nonobstructive. Moderate volume of stool within the colon and rectum. IMPRESSION: Right parietal approach ventriculostomy catheter terminates in the lower right abdomen. No catheter kinking or discontinuity. Electronically Signed   By: Davina Poke D.O.   On: 08/15/2021 11:01   DG Skull 1-3 Views  Result Date: 08/15/2021 CLINICAL DATA:  Altered mental status.  Assess VP shunt EXAM: SHUNT SERIES (AP & LAT SKULL, AP NECK, AP CHEST, AP ABDOMEN) COMPARISON:  CT 03/10/2019 FINDINGS: Right parietal approach ventriculostomy catheter is again seen with distal tip terminating just to the right of midline. Catheter descends the right neck and right chest wall and coils within the pelvis before terminating in the lower right abdomen. No catheter kinking or discontinuity. Postsurgical changes noted from prior suboccipital craniotomy. Stable heart size. Lungs are clear. No pneumothorax. Bowel gas pattern is nonobstructive. Moderate volume of stool within the colon and rectum. IMPRESSION: Right parietal approach ventriculostomy catheter terminates in the lower right abdomen. No catheter kinking or discontinuity. Electronically Signed   By: Davina Poke D.O.   On: 08/15/2021 11:01    Assessment and Plan:   Lymphocytosis/lymphadenopathy CT chest 08/30/2021-bilateral axillary, hilar, mediastinal, and upper abdominal adenopathy Anemia/thrombocytopenia, likely secondary to #1 Dementia SAH in 2006 and AVM resection in 2007, hydrocephalus with placement of a VP shunt Syncope event 08/30/2021  Jeffrey Huerta has advanced dementia.  He was admitted following a syncope event.  A CBC  reveals lymphocytosis and anemia/thrombocytopenia.  He had a similar CBC when he was evaluated in the emergency room 08/07/2021, and 03/09/2021.  The clinical presentation and peripheral blood smear are consistent with a diagnosis of chronic lymphocytic leukemia (small lymphocytic lymphoma).  I  suspect the anemia and mild thrombocytopenia are related to the CLL.  We will obtain a peripheral blood flow cytometry analysis to confirm a diagnosis of CLL.  The differential diagnosis includes another chronic leukemia such as marginal zone/splenic lymphoma and hairy cell leukemia.  He appears asymptomatic and there is no indication for treatment at present.  I discussed the probable diagnosis with his daughter at the bedside.  His wife was present by telephone.  Recommendations: Check vitamin B12 level Serum immunofixation Peripheral blood flow cytometry Outpatient follow-up will be scheduled at the Cancer center  Betsy Coder, MD 09/01/2021, 4:24 PM

## 2021-09-01 NOTE — Progress Notes (Signed)
EEG reviewed-some right-sided dysfunction without any epileptogenicity or seizures.  Likely from the structural abnormality from the shunt and prior encephalomalacia. No further inpatient neurological work-up at this time. Plan relayed to Dr. Eliseo Squires  -- Amie Portland, MD Neurologist [facility] Pager: [phone]

## 2021-09-01 NOTE — Progress Notes (Addendum)
PROGRESS NOTE    Jeffrey Huerta  OAC:[id number]  DOB: 1946/02/01  DOA: 08/29/2021 PCP: Nolene Ebbs, MD Outpatient Specialists:   Hospital course: 76 y.o. male with medical history significant of hypertension, dementia, [place] 2006, AVM resection 2007, hydrocephalus s/p VP shunt was admitted for syncope/episode of unresponsiveness while at adult daycare on 08/29/2021.  Of note patient had had a similar episode of altered awareness on 08/15/2021 and was seen in the ED and discharged home.  Work-up in the ED including CT scan of the brain and imaging of the VP shunt which showed no acute abnormality.     Subjective: Family says patient has not been out of bed since arrival to hospital   Objective: Vitals:   08/31/21 2054 09/01/21 0011 09/01/21 0418 09/01/21 0816  BP: 99/66 108/68 123/68 108/70  Pulse: 60 60 (!) 54 (!) 57  Resp: '18 17 16 17  '$ Temp: 99.2 F (37.3 C) 98.5 F (36.9 C) 98.5 F (36.9 C) 98.6 F (37 C)  TempSrc: Oral Oral Oral Oral  SpO2: 100% 100% 99% 98%  Weight:      Height:        Intake/Output Summary (Last 24 hours) at 09/01/2021 0903 Last data filed at 09/01/2021 0817 Gross per 24 hour  Intake 356 ml  Output 300 ml  Net 56 ml   Filed Weights   08/29/21 0959  Weight: 58.2 kg     Exam:   General: Appearance:    Elderly male in no acute distress     Lungs:     respirations unlabored  Heart:    Bradycardic.   MS:   All extremities are intact.   Neurologic:   Awake, alert- slow to respond at times      Assessment & Plan:   Syncope -Patient was apparently unresponsive while sitting in a chair at his adult daycare -He had a similar episode of transient alteration of consciousness 2 weeks ago -VP shunt and head CT are within normal limits -Patient does have bradycardia, continue patient on telemetry--Patricia Abagail Kitchens was contacted about possible outpatient cardiac monitoring if warranted upon discharge -will need to follow up prior to  d/c -Echocardiogram: shows no valvular abnormalities and normal EF -H&H--stable -Neurology : EEG does not show seizures -does not appear orthostatic vital signs done  Incidental adenopathy on CT -patient and family who would like to pursue further work-up -IR consult placed, plan is for axillary/super clavicular biopsy on 7/17 which will need to be rescheduled as an outpatient procedure if patient is discharged before 09/03/2021. Patient PCP to follow-up on results D/w family utility of pursuing biopsy if they choose to, they tell me they would like to continue seeking biopsy and further information even though if proven cancer they would not want chemotherapy.  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 Benay Spice-- he will see -may not need biopsy  Leukocytosis Very well may be related to diffuse adenopathy with CLL No evidence of infection  Alzheimer's disease Delirium precautions  VP shunt Placed 10 years ago after [facility] and AVM   Not safe for d/c: needs PT eval and orthos as well as biopsy   DVT prophylaxis: Lovenox Code Status: Full Family Communication: Discussion with patient's wife and daughter Tammy Disposition Plan:   Patient is from: Home  Anticipated Discharge Location: Home    Scheduled Meds:  enoxaparin (LOVENOX) injection  40 mg Subcutaneous Daily   sodium chloride flush  3 mL Intravenous Q12H  Continuous Infusions:  sodium chloride 75 mL/hr at 09/01/21 0538    Data Reviewed:  Basic Metabolic Panel: Recent Labs  Lab 08/29/21 1149 08/30/21 0140 September 06, 2021 0231  NA 139 136 139  K 5.3* 4.7 4.5  CL 109 109 108  CO2 21* 23 23  GLUCOSE 93 78 82  BUN '14 13 15  '$ CREATININE 1.06 0.96 1.15  CALCIUM 8.5* 8.1* 7.9*    CBC: Recent Labs  Lab 08/29/21 1149 08/30/21 0140 08/30/21 2017 09-06-2021 0231  WBC 17.3* 16.3*  --  16.0*  NEUTROABS 2.0  --   --   --   HGB 10.9* 9.0* 8.8* 9.0*  HCT 37.1* 29.4* 29.7*  29.3*  MCV 103.1* 96.7  --  97.3  PLT 117* 106*  --  101*    Studies: EEG adult  Result Date: 09-06-21 Lora Havens, MD     09/06/2021 10:18 PM Patient Name: Jeffrey Huerta MRN: [id number] Epilepsy Attending: Lora Havens Referring Physician/Provider: Vanna Scotland, MD Date: 09-06-2021 Duration: 25.15 mins Patient history: 76 year old patient with history of Alzheimer's disease became transiently unresponsive while sitting in a chair at his memory care program. EEG to evaluate for seizure Level of alertness: Awake AEDs during EEG study: None Technical aspects: This EEG study was done with scalp electrodes positioned according to the 10-20 International system of electrode placement. Electrical activity was acquired at a sampling rate of '500Hz'$  and reviewed with a high frequency filter of '70Hz'$  and a low frequency filter of '1Hz'$ . EEG data were recorded continuously and digitally stored. Description: The posterior dominant rhythm consists of 8 Hz activity of moderate voltage (25-35 uV) seen predominantly in posterior head regions, symmetric and reactive to eye opening and eye closing. EEG showed continuous 3 to 6 Hz theta-delta slowing in right fronto-central region. Photic driving was not seen during photic stimulation. Hyperventilation was not performed.   ABNORMALITY - Continuous slow, right fronto-central region. IMPRESSION: This study is suggestive of cortical dysfunction arising from right fronto-central region, likely secondary to underlying structural abnormality. No seizures or epileptiform discharges were seen throughout the recording. Lora Havens   ECHOCARDIOGRAM COMPLETE  Result Date: 08/30/2021    ECHOCARDIOGRAM REPORT   Patient Name:   Jeffrey Huerta Date of Exam: 08/30/2021 Medical Rec #:  [id number]  Height:       63.0 in Accession #:    [id number] Weight:       128.3 lb Date of Birth:  [birth year]  BSA:          1.601 m Patient Age:    75 years   BP:           139/73 mmHg Patient Gender: M           HR:           82 bpm. Exam Location:  Inpatient Procedure: 2D Echo, Cardiac Doppler and Color Doppler Indications:    Syncope  History:        Patient has prior history of Echocardiogram examinations, most                 recent 02/21/2010. Risk Factors:Hypertension. Dementia.  Sonographer:    Joette Catching RCS Referring Phys: [phone] RONDELL A Montel  Sonographer Comments: Technically challenging study due to limited acoustic windows and no parasternal window. Image acquisition challenging due to patient body habitus. IMPRESSIONS  1. Left ventricular ejection fraction, by estimation, is 60 to 65%. The left ventricle has normal function. The left ventricle has  no regional wall motion abnormalities. Left ventricular diastolic parameters are consistent with Grade I diastolic dysfunction (impaired relaxation).  2. Right ventricular systolic function is normal. The right ventricular size is normal. Tricuspid regurgitation signal is inadequate for assessing PA pressure.  3. The mitral valve is normal in structure. No evidence of mitral valve regurgitation. No evidence of mitral stenosis.  4. The aortic valve is tricuspid. There is mild calcification of the aortic valve. Aortic valve regurgitation is not visualized. No aortic stenosis is present.  5. The inferior vena cava is normal in size with greater than 50% respiratory variability, suggesting right atrial pressure of 3 mmHg. FINDINGS  Left Ventricle: Left ventricular ejection fraction, by estimation, is 60 to 65%. The left ventricle has normal function. The left ventricle has no regional wall motion abnormalities. The left ventricular internal cavity size was normal in size. There is  no left ventricular hypertrophy. Left ventricular diastolic parameters are consistent with Grade I diastolic dysfunction (impaired relaxation). Right Ventricle: The right ventricular size is normal. No increase in right ventricular wall thickness. Right ventricular systolic  function is normal. Tricuspid regurgitation signal is inadequate for assessing PA pressure. Left Atrium: Left atrial size was normal in size. Right Atrium: Right atrial size was normal in size. Pericardium: There is no evidence of pericardial effusion. Mitral Valve: The mitral valve is normal in structure. No evidence of mitral valve regurgitation. No evidence of mitral valve stenosis. Tricuspid Valve: The tricuspid valve is normal in structure. Tricuspid valve regurgitation is not demonstrated. Aortic Valve: The aortic valve is tricuspid. There is mild calcification of the aortic valve. Aortic valve regurgitation is not visualized. No aortic stenosis is present. Aortic valve mean gradient measures 5.0 mmHg. Aortic valve peak gradient measures 7.3 mmHg. Aortic valve area, by VTI measures 1.98 cm. Pulmonic Valve: The pulmonic valve was normal in structure. Pulmonic valve regurgitation is not visualized. Aorta: The aortic root and ascending aorta are structurally normal, with no evidence of dilitation. Venous: The inferior vena cava is normal in size with greater than 50% respiratory variability, suggesting right atrial pressure of 3 mmHg. IAS/Shunts: No atrial level shunt detected by color flow Doppler.  LEFT VENTRICLE PLAX 2D LVOT diam:     1.60 cm     Diastology LV SV:         42          LV e' medial:    9.36 cm/s LV SV Index:   26          LV E/e' medial:  7.5 LVOT Area:     2.01 cm    LV e' lateral:   11.70 cm/s                            LV E/e' lateral: 6.0  LV Volumes (MOD) LV vol d, MOD A2C: 39.9 ml LV vol d, MOD A4C: 51.9 ml LV vol s, MOD A2C: 22.1 ml LV vol s, MOD A4C: 19.3 ml LV SV MOD A2C:     17.8 ml LV SV MOD A4C:     51.9 ml LV SV MOD BP:      27.9 ml RIGHT VENTRICLE             IVC RV Basal diam:  2.90 cm     IVC diam: 1.40 cm RV Mid diam:    1.80 cm RV S prime:     16.90 cm/s TAPSE (M-mode): 1.7 cm  LEFT ATRIUM             Index        RIGHT ATRIUM          Index LA Vol (A2C):   13.0 ml 8.12 ml/m    RA Area:     7.74 cm LA Vol (A4C):   26.4 ml 16.49 ml/m  RA Volume:   12.00 ml 7.49 ml/m LA Biplane Vol: 19.3 ml 12.05 ml/m  AORTIC VALVE                     PULMONIC VALVE AV Area (Vmax):    1.76 cm      PV Vmax:       1.20 m/s AV Area (Vmean):   1.75 cm      PV Peak grad:  5.8 mmHg AV Area (VTI):     1.98 cm AV Vmax:           135.00 cm/s AV Vmean:          103.000 cm/s AV VTI:            0.210 m AV Peak Grad:      7.3 mmHg AV Mean Grad:      5.0 mmHg LVOT Vmax:         118.00 cm/s LVOT Vmean:        89.600 cm/s LVOT VTI:          0.207 m LVOT/AV VTI ratio: 0.99 MITRAL VALVE MV Area (PHT): 5.70 cm    SHUNTS MV Decel Time: 133 msec    Systemic VTI:  0.21 m MV E velocity: 70.30 cm/s  Systemic Diam: 1.60 cm MV A velocity: 94.70 cm/s MV E/A ratio:  0.74 Dalton McleanMD Electronically signed by Franki Monte Signature Date/Time: 08/30/2021/3:26:27 PM    Final     Principal Problem:   Syncope and collapse Active Problems:   SIRS (systemic inflammatory response syndrome) (HCC)   Dementia (HCC)   S/P VP shunt   Hyperkalemia     Geradine Girt, DO [facility]  If 7PM-7AM, please contact night-coverage [email or web address]   LOS: 0 days

## 2021-09-01 NOTE — Evaluation (Signed)
Physical Therapy Evaluation Patient Details Name: Jeffrey Huerta MRN: [id number] DOB: 1946/01/02 Today's Date: 09/01/2021  History of Present Illness  76 y.o. male was admitted for syncope/episode of unresponsiveness while at adult daycare on 08/29/2021  Found to be bradycardic and hypotensive. Admitted for observation. [id number], dementia, [place] 2006, AVM resection 2007, hydrocephalus s/p VP shunt  Clinical Impression  Patient evaluated by Physical Therapy with no further acute PT needs identified. All education has been completed and the patient/family has no further questions. Pt has no follow-up Physical Therapy or equipment needs. PT is signing off. Thank you for this referral.        Recommendations for follow up therapy are one component of a multi-disciplinary discharge planning process, led by the attending physician.  Recommendations may be updated based on patient status, additional functional criteria and insurance authorization.  Follow Up Recommendations No PT follow up      Assistance Recommended at Discharge Frequent or constant Supervision/Assistance  Patient can return home with the following  Assistance with cooking/housework;Direct supervision/assist for medications management;Direct supervision/assist for financial management;Assist for transportation;Help with stairs or ramp for entrance;A little help with bathing/dressing/bathroom;A little help with walking and/or transfers    Equipment Recommendations None recommended by PT  Recommendations for Other Services       Functional Status Assessment Patient has not had a recent decline in their functional status     Precautions / Restrictions Precautions Precautions: Fall Precaution Comments: syncope Restrictions Weight Bearing Restrictions: No      Mobility  Bed Mobility               General bed mobility comments: sitting in recliner    Transfers Overall transfer level: Needs assistance Equipment  used: None Transfers: Sit to/from Stand Sit to Stand: Supervision           General transfer comment: supervision for safety, good power up and self steady    Ambulation/Gait Ambulation/Gait assistance: Min guard Gait Distance (Feet): 450 Feet Assistive device: None Gait Pattern/deviations: Step-through pattern, Drifts right/left, Shuffle Gait velocity: too fast for conditions Gait velocity interpretation: >2.62 ft/sec, indicative of community ambulatory   General Gait Details: min guard for gait, preferring to wear slides that did not stay on well, drifts in hallway, no overt LoB      Balance Overall balance assessment: Mild deficits observed, not formally tested                                           Pertinent Vitals/Pain Pain Assessment Pain Assessment: No/denies pain    Home Living Family/patient expects to be discharged to:: Private residence Living Arrangements: Spouse/significant other Available Help at Discharge: Family Type of Home: House Home Access: Stairs to enter Entrance Stairs-Rails: None Technical brewer of Steps: 1   Home Layout: One level Home Equipment: None      Prior Function Prior Level of Function : Needs assist  Cognitive Assist : ADLs (cognitive)   ADLs (Cognitive): Set up cues       Mobility Comments: ambulates without AD ADLs Comments: needs set up assist for ADLs, family provides for iADLs     Hand Dominance        Extremity/Trunk Assessment   Upper Extremity Assessment Upper Extremity Assessment: Overall WFL for tasks assessed    Lower Extremity Assessment Lower Extremity Assessment: Overall WFL for tasks assessed  Communication   Communication: HOH  Cognition Arousal/Alertness: Awake/alert Behavior During Therapy: Impulsive Overall Cognitive Status: History of cognitive impairments - at baseline                                 General Comments: oriented to self,  place and situation, slightly impulsive with his mobility, requires increased cuing for safety/speed with ill fitting shoes        General Comments General comments (skin integrity, edema, etc.): VSS on RA        Assessment/Plan    PT Assessment Patient does not need any further PT services         PT Goals (Current goals can be found in the Care Plan section)  Acute Rehab PT Goals Patient Stated Goal: go home PT Goal Formulation: With patient/family     AM-PAC PT "6 Clicks" Mobility  Outcome Measure Help needed turning from your back to your side while in a flat bed without using bedrails?: None Help needed moving from lying on your back to sitting on the side of a flat bed without using bedrails?: None Help needed moving to and from a bed to a chair (including a wheelchair)?: None Help needed standing up from a chair using your arms (e.g., wheelchair or bedside chair)?: None Help needed to walk in hospital room?: None Help needed climbing 3-5 steps with a railing? : None 6 Click Score: 24    End of Session Equipment Utilized During Treatment: Gait belt Activity Tolerance: Patient tolerated treatment well Patient left: in chair;with family/visitor present Nurse Communication: Mobility status;Other (comment) (no chair alarm available) PT Visit Diagnosis: Unsteadiness on feet (R26.81)    Time: 1540-0867: 6195-0932 PT Time Calculation (min) (ACUTE ONLY): 40 min   Charges:   PT Evaluation $PT Eval Moderate Complexity: 1 Mod PT Treatments $Therapeutic Exercise: 23-37 mins        Adlee Paar B. Migdalia Dk PT, DPT Acute Rehabilitation Services Please use secure chat or  Call Office [phone]   [place] 09/01/2021, 4:55 PM

## 2021-09-02 DIAGNOSIS — R55 Syncope and collapse: Secondary | ICD-10-CM | POA: Diagnosis not present

## 2021-09-02 LAB — CBC
HCT: 29.6 % — ABNORMAL LOW (ref 39.0–52.0)
Hemoglobin: 9 g/dL — ABNORMAL LOW (ref 13.0–17.0)
MCH: 29.8 pg (ref 26.0–34.0)
MCHC: 30.4 g/dL (ref 30.0–36.0)
MCV: 98 fL (ref 80.0–100.0)
Platelets: 86 10*3/uL — ABNORMAL LOW (ref 150–400)
RBC: 3.02 MIL/uL — ABNORMAL LOW (ref 4.22–5.81)
RDW: 15.9 % — ABNORMAL HIGH (ref 11.5–15.5)
WBC: 14 10*3/uL — ABNORMAL HIGH (ref 4.0–10.5)
nRBC: 0 % (ref 0.0–0.2)

## 2021-09-02 LAB — BASIC METABOLIC PANEL
Anion gap: 7 (ref 5–15)
BUN: 17 mg/dL (ref 8–23)
CO2: 24 mmol/L (ref 22–32)
Calcium: 8.2 mg/dL — ABNORMAL LOW (ref 8.9–10.3)
Chloride: 111 mmol/L (ref 98–111)
Creatinine, Ser: 0.99 mg/dL (ref 0.61–1.24)
GFR, Estimated: >60 mL/min (ref >60)
Glucose, Bld: 82 mg/dL (ref 70–99)
Potassium: 4 mmol/L (ref 3.5–5.1)
Sodium: 142 mmol/L (ref 135–145)

## 2021-09-02 MED ORDER — CYANOCOBALAMIN 500 MCG PO TABS: 500.0000 ug | ORAL_TABLET | Freq: Every day | ORAL | Status: DC

## 2021-09-02 NOTE — Progress Notes (Signed)
IR received request to cancel lymph node biopsy as there is no indication for treatment. He will follow up outpatient at the [facility].   Order cancelled.  Soyla Dryer, [place] [phone] 09/02/2021, 8:09 AM

## 2021-09-02 NOTE — Discharge Summary (Signed)
Physician Discharge Summary  Heliodoro Domagalski ZWC:[id number] DOB: Sep 09, 1945 DOA: 08/29/2021  PCP: Nolene Ebbs, MD  Admit date: 08/29/2021 Discharge date: 09/02/2021  Admitted From: home Discharge disposition: home   Recommendations for Outpatient Follow-Up:   Cardiac event monitor to be mailed to patient Consider palliative care referral    Discharge Diagnosis:   Principal Problem:   Syncope and collapse Active Problems:   SIRS (systemic inflammatory response syndrome) ([place])   Dementia ([place])   S/P VP shunt   Hyperkalemia    Discharge Condition: Improved.  Diet recommendation: Low sodium, heart healthy.  Wound care: None.  Code status: Full.   History of Present Illness:   Ormand Senn is a 76 y.o. male with medical history significant of hypertension, dementia, [place] 2006, AVM resection 2007, hydrocephalus s/p VP shunt who presents after being found unresponsive while at [employer] [facility].  History is obtained from review of records and family present at bedside as he has significant dementia and unable to.  He was sitting in a chair lost consciousness and was placed on the floor for beginning to wake up.  It was reported that the patient was breathing, but radial pulses were noted to be difficult to palpate.  Upon EMS arrival blood pressure 106/60 with heart rate 48.     The patient had been seen in the ED on 6/28 for having a transient episode of altered awareness.  Work-up included urinalysis which showed no signs of infection, TSH within normal limits, CT scan of the brain and imaging of the VP shunt which showed no acute abnormality.  Patient was given 500 mL of IV fluids and discharged home.  Family notes that the patient is only been complaining of tiredness recently and lost on 10 to 15 pounds over the last 6 months.  Denies any other recent changes.     Admission in the emergency department patient was noted to be afebrile with respirations 1735, pulse  53-60. Labs revealed WBC 17.3, hemoglobin 10.9, platelets 117, sodium 139, potassium 5.3, BUN 15, creatinine 1.06, and calcium 8.5.  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 Benay Spice: The clinical presentation and peripheral blood smear are consistent with a diagnosis of chronic lymphocytic leukemia (small lymphocytic lymphoma).  I suspect the anemia and mild thrombocytopenia are related to the CLL.  We will obtain a peripheral blood flow cytometry analysis to confirm a diagnosis of CLL.  The differential diagnosis includes another chronic leukemia such as marginal zone/splenic lymphoma and hairy cell leukemia.  He appears asymptomatic and there is no indication for treatment at present.  I discussed the probable diagnosis with his daughter at the bedside.  His wife was present by telephone.   Recommendations: Check vitamin B12 level Serum immunofixation Peripheral blood flow cytometry Outpatient follow-up  will be scheduled at the Cancer center    Leukocytosis Very well may be related to diffuse adenopathy with CLL No evidence of infection   Alzheimer's disease Delirium precautions   VP shunt Placed 10 years  ago after [facility] and AVM      Medical Consultants:    Neurology oncology  Discharge Exam:   Vitals:   09/02/21 0010 09/02/21 0436  BP: 126/89 (!) 140/91  Pulse: (!) 56 60  Resp: 18 17  Temp: 98.1 F (36.7 C) 98.6 F (37 C)  SpO2: 100% 100%   Vitals:   09/01/21 1527 09/01/21 1932 09/02/21 0010 09/02/21 0436  BP: 130/69 130/70 126/89 (!) 140/91  Pulse: 60 74 (!) 56 60  Resp: '17 18 18 17  '$ Temp: 98.4 F (36.9 C) 98.9 F (37.2 C) 98.1 F (36.7 C) 98.6 F (37 C)  TempSrc: Oral Oral Oral Oral  SpO2: 100% 100% 100% 100%  Weight:      Height:        General exam: Appears calm and comfortable.  The results of significant diagnostics from this hospitalization (including imaging, microbiology, ancillary and laboratory) are listed below for reference.     Procedures and Diagnostic Studies:   ECHOCARDIOGRAM COMPLETE  Result Date: 08/30/2021    ECHOCARDIOGRAM REPORT   Patient Name:   SIRIS HOOS Date of Exam: 08/30/2021 Medical Rec #:  [id number]  Height:       63.0 in Accession #:    [id number] Weight:       128.3 lb Date of Birth:  1945/08/17  BSA:          1.601 m Patient Age:    52 years   BP:           139/73 mmHg Patient Gender: M          HR:           82 bpm. Exam Location:  Inpatient Procedure: 2D Echo, Cardiac Doppler and Color Doppler Indications:    Syncope  History:        Patient has prior history of Echocardiogram examinations, most                 recent 02/21/2010. Risk Factors:Hypertension. Dementia.  Sonographer:    Joette Catching RCS Referring Phys: [phone] RONDELL A Pomales  Sonographer Comments: Technically challenging study due to limited acoustic windows and no parasternal window. Image acquisition challenging due to patient body habitus. IMPRESSIONS  1. Left ventricular ejection fraction, by estimation, is 60 to 65%. The left ventricle has normal function. The left ventricle has no regional wall motion abnormalities. Left ventricular diastolic parameters are  consistent with Grade I diastolic dysfunction (impaired relaxation).  2. Right ventricular systolic function is normal. The right ventricular size is normal. Tricuspid regurgitation signal is inadequate for assessing PA pressure.  3. The mitral valve is normal in structure. No evidence of mitral valve regurgitation. No evidence of mitral stenosis.  4. The aortic valve is tricuspid. There is mild calcification of the aortic valve. Aortic valve regurgitation is not visualized. No aortic stenosis is present.  5. The inferior vena cava is normal in size with greater than 50% respiratory variability, suggesting right atrial pressure of 3 mmHg. FINDINGS  Left Ventricle: Left ventricular ejection fraction, by estimation, is 60 to 65%. The left ventricle has normal function. The left ventricle has no regional wall motion abnormalities. The left ventricular internal cavity size was normal in size. There is  no left ventricular hypertrophy.  Left ventricular diastolic parameters are consistent with Grade I diastolic dysfunction (impaired relaxation). Right Ventricle: The right ventricular size is normal. No increase in right ventricular wall thickness. Right ventricular systolic function is normal. Tricuspid regurgitation signal is inadequate for assessing PA pressure. Left Atrium: Left atrial size was normal in size. Right Atrium: Right atrial size was normal in size. Pericardium: There is no evidence of pericardial effusion. Mitral Valve: The mitral valve is normal in structure. No evidence of mitral valve regurgitation. No evidence of mitral valve stenosis. Tricuspid Valve: The tricuspid valve is normal in structure. Tricuspid valve regurgitation is not demonstrated. Aortic Valve: The aortic valve is tricuspid. There is mild calcification of the aortic valve. Aortic valve regurgitation is not visualized. No aortic stenosis is present. Aortic valve mean gradient measures 5.0 mmHg. Aortic valve peak gradient measures 7.3  mmHg. Aortic valve area, by VTI measures 1.98 cm. Pulmonic Valve: The pulmonic valve was normal in structure. Pulmonic valve regurgitation is not visualized. Aorta: The aortic root and ascending aorta are structurally normal, with no evidence of dilitation. Venous: The inferior vena cava is normal in size with greater than 50% respiratory variability, suggesting right atrial pressure of 3 mmHg. IAS/Shunts: No atrial level shunt detected by color flow Doppler.  LEFT VENTRICLE PLAX 2D LVOT diam:     1.60 cm     Diastology LV SV:         42          LV e' medial:    9.36 cm/s LV SV Index:   26          LV E/e' medial:  7.5 LVOT Area:     2.01 cm    LV e' lateral:   11.70 cm/s                            LV E/e' lateral: 6.0  LV Volumes (MOD) LV vol d, MOD A2C: 39.9 ml LV vol d, MOD A4C: 51.9 ml LV vol s, MOD A2C: 22.1 ml LV vol s, MOD A4C: 19.3 ml LV SV MOD A2C:     17.8 ml LV SV MOD A4C:     51.9 ml LV SV MOD BP:      27.9 ml RIGHT VENTRICLE             IVC RV Basal diam:  2.90 cm     IVC diam: 1.40 cm RV Mid diam:    1.80 cm RV S prime:     16.90 cm/s TAPSE (M-mode): 1.7 cm LEFT ATRIUM             Index        RIGHT ATRIUM          Index LA Vol (A2C):   13.0 ml 8.12 ml/m   RA Area:     7.74 cm LA Vol (A4C):   26.4 ml 16.49 ml/m  RA Volume:   12.00 ml 7.49 ml/m LA Biplane Vol: 19.3 ml 12.05 ml/m  AORTIC VALVE                     PULMONIC VALVE AV Area (Vmax):    1.76 cm      PV Vmax:       1.20 m/s AV Area (Vmean):   1.75 cm      PV Peak grad:  5.8 mmHg AV Area (VTI):     1.98 cm AV Vmax:  135.00 cm/s AV Vmean:          103.000 cm/s AV VTI:            0.210 m AV Peak Grad:      7.3 mmHg AV Mean Grad:      5.0 mmHg LVOT Vmax:         118.00 cm/s LVOT Vmean:        89.600 cm/s LVOT VTI:          0.207 m LVOT/AV VTI ratio: 0.99 MITRAL VALVE MV Area (PHT): 5.70 cm    SHUNTS MV Decel Time: 133 msec    Systemic VTI:  0.21 m MV E velocity: 70.30 cm/s  Systemic Diam: 1.60 cm MV A velocity: 94.70 cm/s MV E/A  ratio:  0.74 Dalton McleanMD Electronically signed by Franki Monte Signature Date/Time: 08/30/2021/3:26:27 PM    Final    CT Angio Chest Pulmonary Embolism (PE) W or WO Contrast  Result Date: 08/30/2021 CLINICAL DATA:  Pulmonary embolism (PE) suspected, positive D-dimer EXAM: CT ANGIOGRAPHY CHEST WITH CONTRAST TECHNIQUE: Multidetector CT imaging of the chest was performed using the standard protocol during bolus administration of intravenous contrast. Multiplanar CT image reconstructions and MIPs were obtained to evaluate the vascular anatomy. RADIATION DOSE REDUCTION: This exam was performed according to the departmental dose-optimization program which includes automated exposure control, adjustment of the mA and/or kV according to patient size and/or use of iterative reconstruction technique. CONTRAST:  31m OMNIPAQUE IOHEXOL 350 MG/ML SOLN COMPARISON:  None Available. FINDINGS: Cardiovascular: No filling defects in the pulmonary arteries to suggest pulmonary emboli. Heart is normal size. Scattered coronary artery and aortic calcifications. Ascending thoracic aorta mildly dilated at 4.1 cm. Mediastinum/Nodes: Bilateral axillary adenopathy. Index right axillary lymph node has a short axis diameter of 12 mm. Index left axillary lymph node has a short axis diameter of 18 mm. Mediastinal adenopathy. AP window lymph node has a short axis diameter of 11 mm. Right paratracheal lymph node has a short axis diameter of 9 mm. Bilateral hilar adenopathy with index right hilar lymph having a short axis diameter of 16 mm. Trachea and esophagus are unremarkable. Thyroid unremarkable. Lungs/Pleura: Dependent and bibasilar atelectasis.  No effusions. Upper Abdomen: Upper abdominal adenopathy noted. Musculoskeletal: Chest wall soft tissues are unremarkable. No acute bony abnormality. Review of the MIP images confirms the above findings. IMPRESSION: No evidence of pulmonary embolus. Bilateral axillary, hilar, mediastinal and  upper abdominal adenopathy concerning for lymphoma or metastatic disease. Coronary artery disease. 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 Recommend annual imaging followup by CTA or MRA. This recommendation follows 2010 ACCF/AHA/AATS/ACR/ASA/SCA/SCAI/SIR/STS/SVM Guidelines for the Diagnosis and Management of Patients with Thoracic Aortic Disease. Circulation. 2010; 121:: J242-A834 Aortic aneurysm NOS (ICD10-I71.9) Aortic Atherosclerosis (ICD10-I70.0). Electronically Signed   By: KRolm BaptiseM.D.   On: 08/30/2021 01:44   CT HEAD WO CONTRAST  Result Date: 08/29/2021 CLINICAL DATA:  Syncope/presyncope, cerebrovascular cause suspected EXAM: CT HEAD WITHOUT CONTRAST TECHNIQUE: Contiguous axial images were obtained from the base of the skull through the vertex without intravenous contrast. RADIATION DOSE REDUCTION: This exam was performed according to the departmental dose-optimization program which includes automated exposure control, adjustment of the mA and/or kV according to patient size and/or use of iterative reconstruction technique. COMPARISON:  CT head August 15, 2021. FINDINGS: Brain: Suspected small (probably 3-4 mm) bilateral low-density subdural fluid collections, similar to the prior. Right frontal approach ventriculostomy catheter similar hypoattenuation along the ventriculostomy tract and similar position of the tip. Visualized portions  of the ventriculostomy catheter appear intact. Similar size of the ventricular system without evidence of hydrocephalus. Remote left cerebellar infarct. Probable embolization coils in this region. No evidence of acute large vascular territory infarct. Areas of patchy hypoattenuation appears similar to prior and are nonspecific but compatible with chronic microvascular ischemic disease. Vascular: Calcific intracranial atherosclerosis. Skull: No mastoid effusions Sinuses/Orbits: Left frontal, left frontoethmoidal and anterior left ethmoid air cell opacification. Frothy  secretions in the left sphenoid sinus and posterior left ethmoid air cell. Left maxillary sinus mucosal thickening. Other: Large right mastoid effusion with middle ear fluid. IMPRESSION: 1. Suspected small (probably 3-4 mm) bilateral low-density subdural fluid collections, similar to the prior. 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 No significant mass effect. An MRI could confirm and further evaluate if clinically warranted. 2. Similar size of the ventricular system without evidence of hydrocephalus. Right frontal approach ventriculostomy catheter in place. 3. 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 4. Paranasal sinus disease, described below. 5. Large right mastoid effusion with middle ear fluid. Electronically Signed   By: Margaretha Sheffield M.D.   On: 08/29/2021 11:33   DG Chest Port 1 View  Result Date: 08/29/2021 CLINICAL DATA:  loc EXAM: PORTABLE CHEST 1 VIEW COMPARISON:  None Available. FINDINGS: Again seen is a right VP shunt stable. Cardiomediastinal silhouette is upper normal. Thoracic aorta is ectatic. Lungs remain clear. The visualized skeletal structures are unremarkable. IMPRESSION: No active disease and no significant interval change. Electronically Signed   By: Frazier Richards M.D.   On: 08/29/2021 10:51     Labs:   Basic Metabolic Panel: Recent Labs  Lab 08/29/21 1149 08/30/21 0140 08/31/21 0231 09/02/21 0241  NA 139 136 139 142  K 5.3* 4.7 4.5 4.0  CL 109 109 108 111  CO2 21* '23 23 24  '$ GLUCOSE 93 78 82 82  BUN '14 13 15 17  '$ CREATININE 1.06 0.96 1.15 0.99  CALCIUM 8.5* 8.1* 7.9* 8.2*   GFR Estimated Creatinine Clearance: 50.7 mL/min (by C-G formula based on SCr of 0.99 mg/dL). Liver Function Tests: No results for input(s): "AST", "ALT", "ALKPHOS", "BILITOT", "PROT", "ALBUMIN" in the last 168 hours. No results for input(s): "LIPASE", "AMYLASE" in the last 168 hours. No results for input(s): "AMMONIA" in the last  168 hours. Coagulation profile No results for input(s): "INR", "PROTIME" in the last 168 hours.  CBC: Recent Labs  Lab 08/29/21 1149 08/30/21 0140 08/30/21 2017 08/31/21 0231 09/02/21 0241  WBC 17.3* 16.3*  --  16.0* 14.0*  NEUTROABS 2.0  --   --   --   --   HGB 10.9* 9.0* 8.8* 9.0* 9.0*  HCT 37.1* 29.4* 29.7* 29.3* 29.6*  MCV 103.1* 96.7  --  97.3 98.0  PLT 117* 106*  --  101* 86*   Cardiac Enzymes: No results for input(s): "CKTOTAL", "CKMB", "CKMBINDEX", "TROPONINI" in the last 168 hours. BNP: Invalid input(s): "POCBNP" CBG: No results for input(s): "GLUCAP" in the last 168 hours. D-Dimer No results for input(s): "DDIMER" in the last 72 hours. Hgb A1c No results for input(s): "HGBA1C" in the last 72 hours. Lipid Profile No results for input(s): "CHOL", "HDL", "LDLCALC", "TRIG", "CHOLHDL", "LDLDIRECT" in the last 72 hours. Thyroid function studies No results for input(s): "TSH", "T4TOTAL", "T3FREE", "THYROIDAB" in the last 72 hours.  Invalid input(s): "FREET3" Anemia work up Recent Labs    09/01/21 [place]   Microbiology Recent Results (from the past 240 hour(s))  Culture, blood (single)  w Reflex to ID Panel     Status: None (Preliminary result)   Collection Time: 08/29/21  4:16 PM   Specimen: BLOOD  Result Value Ref Range Status   Specimen Description BLOOD SITE NOT SPECIFIED  Final   Special Requests   Final    BOTTLES DRAWN AEROBIC AND ANAEROBIC Blood Culture results may not be optimal due to an inadequate volume of blood received in culture bottles   Culture   Final    NO GROWTH 3 DAYS Performed at [facility], [place] [address] [place], [place] [address]    Report Status PENDING  Incomplete     Discharge Instructions:   Discharge Instructions     Diet - low sodium heart healthy   Complete by: As directed    Discharge instructions   Complete by: As directed    Your heart monitor will be mailed to you with instructions of how to  place Follow up with Dr. Benay Spice for results of blood work   Increase activity slowly   Complete by: As directed       Allergies as of 09/02/2021   No Known Allergies      Medication List     TAKE these medications    acetaminophen 500 MG tablet Commonly known as: TYLENOL Take 500 mg by mouth daily as needed for mild pain (hand cramps).   cyanocobalamin 500 MCG tablet Commonly known as: CYANOCOBALAMIN Take 1 tablet (500 mcg total) by mouth daily.        Follow-up Information     Nolene Ebbs, MD Follow up in 1 week(s).   Specialty: Internal Medicine Contact information: [address] [place] [address] [phone]                  Time coordinating discharge: 45 min  Signed:  Geradine Girt DO  [facility] 09/02/2021, 7:55 AM

## 2021-09-03 ENCOUNTER — Encounter: Payer: Self-pay | Admitting: *Deleted

## 2021-09-03 ENCOUNTER — Telehealth: Payer: Self-pay | Admitting: *Deleted

## 2021-09-03 LAB — CULTURE, BLOOD (SINGLE): Culture: NO GROWTH

## 2021-09-03 NOTE — Progress Notes (Signed)
Patient ID: Jeffrey Huerta, male   DOB: 1946/01/30, 76 y.o.   MRN: [id number] Patient enrolled for [employer] to ship a 30 day cardiac event monitor to address on file.  Letter with instructions mailed to patient.  DOD to read.  Results to PCP, Dr. Nolene Ebbs.

## 2021-09-04 LAB — SURGICAL PATHOLOGY

## 2021-09-05 ENCOUNTER — Other Ambulatory Visit: Payer: Self-pay | Admitting: *Deleted

## 2021-09-05 DIAGNOSIS — D72829 Elevated white blood cell count, unspecified: Secondary | ICD-10-CM

## 2021-09-06 LAB — MULTIPLE MYELOMA PANEL, SERUM
Albumin SerPl Elph-Mcnc: 3 g/dL (ref 2.9–4.4)
Albumin/Glob SerPl: 0.7 (ref 0.7–1.7)
Alpha 1: 0.2 g/dL (ref 0.0–0.4)
Alpha2 Glob SerPl Elph-Mcnc: 0.6 g/dL (ref 0.4–1.0)
B-Globulin SerPl Elph-Mcnc: 1 g/dL (ref 0.7–1.3)
Gamma Glob SerPl Elph-Mcnc: 3.2 g/dL — ABNORMAL HIGH (ref 0.4–1.8)
Globulin, Total: 5 g/dL — ABNORMAL HIGH (ref 2.2–3.9)
IgA: 531 mg/dL — ABNORMAL HIGH (ref 61–437)
IgG (Immunoglobin G), Serum: 3215 mg/dL — ABNORMAL HIGH (ref 603–1613)
IgM (Immunoglobulin M), Srm: 313 mg/dL — ABNORMAL HIGH (ref 15–143)
Total Protein ELP: 8 g/dL (ref 6.0–8.5)

## 2021-09-10 ENCOUNTER — Ambulatory Visit: Payer: Medicare Other | Attending: Physician Assistant

## 2021-09-10 DIAGNOSIS — R55 Syncope and collapse: Secondary | ICD-10-CM | POA: Diagnosis not present

## 2021-09-27 ENCOUNTER — Inpatient Hospital Stay: Payer: Medicare Other | Admitting: Oncology

## 2021-09-27 ENCOUNTER — Inpatient Hospital Stay: Payer: Medicare Other | Attending: Oncology

## 2021-09-27 VITALS — BP 117/73 | HR 80 | Temp 98.4°F | Resp 18 | Ht 63.0 in | Wt 122.2 lb

## 2021-09-27 DIAGNOSIS — D6959 Other secondary thrombocytopenia: Secondary | ICD-10-CM | POA: Insufficient documentation

## 2021-09-27 DIAGNOSIS — D630 Anemia in neoplastic disease: Secondary | ICD-10-CM | POA: Insufficient documentation

## 2021-09-27 DIAGNOSIS — D72829 Elevated white blood cell count, unspecified: Secondary | ICD-10-CM

## 2021-09-27 DIAGNOSIS — C9110 Chronic lymphocytic leukemia of B-cell type not having achieved remission: Secondary | ICD-10-CM | POA: Insufficient documentation

## 2021-09-27 DIAGNOSIS — F0390 Unspecified dementia without behavioral disturbance: Secondary | ICD-10-CM | POA: Diagnosis not present

## 2021-09-27 LAB — CBC WITH DIFFERENTIAL (CANCER CENTER ONLY)
Abs Immature Granulocytes: 0.04 10*3/uL (ref 0.00–0.07)
Basophils Absolute: 0 10*3/uL (ref 0.0–0.1)
Basophils Relative: 0 %
Eosinophils Absolute: 0.1 10*3/uL (ref 0.0–0.5)
Eosinophils Relative: 0 %
HCT: 34 % — ABNORMAL LOW (ref 39.0–52.0)
Hemoglobin: 10.5 g/dL — ABNORMAL LOW (ref 13.0–17.0)
Immature Granulocytes: 0 %
Lymphocytes Relative: 81 %
Lymphs Abs: 14.2 10*3/uL — ABNORMAL HIGH (ref 0.7–4.0)
MCH: 30.4 pg (ref 26.0–34.0)
MCHC: 30.9 g/dL (ref 30.0–36.0)
MCV: 98.6 fL (ref 80.0–100.0)
Monocytes Absolute: 1.4 10*3/uL — ABNORMAL HIGH (ref 0.1–1.0)
Monocytes Relative: 8 %
Neutro Abs: 2 10*3/uL (ref 1.7–7.7)
Neutrophils Relative %: 11 %
Platelet Count: 107 10*3/uL — ABNORMAL LOW (ref 150–400)
RBC: 3.45 MIL/uL — ABNORMAL LOW (ref 4.22–5.81)
RDW: 16.2 % — ABNORMAL HIGH (ref 11.5–15.5)
WBC Count: 17.8 10*3/uL — ABNORMAL HIGH (ref 4.0–10.5)
nRBC: 0 % (ref 0.0–0.2)

## 2021-09-27 NOTE — Progress Notes (Signed)
  [place] [facility] OFFICE PROGRESS NOTE   Diagnosis: CLL  INTERVAL HISTORY:   I saw Jeffrey Huerta when he was hospital last month following a syncope event.  He was noted to have lymphocytosis and lymphadenopathy.  Peripheral blood was submitted for flow cytometry.  He was discharged home 09/02/2021.  He is here today with his wife and daughter.  He is living at home.  He attends an adult daycare.  They report he has a good appetite.  No fever or night sweats.  No new complaint.  Objective:  Vital signs in last 24 hours:  Blood pressure 117/73, pulse 80, temperature 98.4 F (36.9 C), temperature source Oral, resp. rate 18, height '5\' 3"'$  (1.6 m), weight 122 lb 3.2 oz (55.4 kg), SpO2 100 %.    Lymphatics: Soft mobile bilateral cervical, axillary, inguinal, and femoral nodes measuring 1-2 cm. Resp: Lungs clear bilaterally Cardio: Regular rate and rhythm GI: Nontender, no hepatosplenomegaly Vascular: No leg edema  Lab Results:  Lab Results  Component Value Date   WBC 17.8 (H) 09/27/2021   HGB 10.5 (L) 09/27/2021   HCT 34.0 (L) 09/27/2021   MCV 98.6 09/27/2021   PLT 107 (L) 09/27/2021   NEUTROABS 2.0 09/27/2021    CMP  Lab Results  Component Value Date   NA 142 09/02/2021   K 4.0 09/02/2021   CL 111 09/02/2021   CO2 24 09/02/2021   GLUCOSE 82 09/02/2021   BUN 17 09/02/2021   CREATININE 0.99 09/02/2021   CALCIUM 8.2 (L) 09/02/2021   PROT 8.4 (H) 08/15/2021   ALBUMIN 2.6 (L) 08/15/2021   AST 17 08/15/2021   ALT 12 08/15/2021   ALKPHOS 53 08/15/2021   BILITOT 0.1 (L) 08/15/2021   GFRNONAA >60 09/02/2021   GFRAA >60 03/10/2019     Medications: I have reviewed the patient's current medications.   Assessment/Plan:  CLL Peripheral blood flow cytometry 09/01/2021-B-cell population coexpressing CD5 and CD200 with lambda restriction consistent with CLL CT chest 08/30/2021-bilateral axillary, hilar, mediastinal, and upper abdominal adenopathy Elevated  immunoglobulins 09/01/2021 with no monoclonal protein identified 2.  Anemia/thrombocytopenia secondary to #1 3.  Dementia 4.  [place] in 2006 and AVM resection in 2007, hydrocephalus with placement of a VP shunt  Disposition:  Jeffrey Huerta appears stable.  He has been diagnosed with chronic lymphocytic leukemia.  He appears asymptomatic from the CLL.  I discussed the indication for treating CLL with his wife and daughter.  They will call if he develops new symptoms.  He will return for an office and lab visit in 2-3 months.  He is at increased risk for infection with the CLL.  He will seek medical attention for symptoms of an infection.  He will remain up-to-date on influenza, pneumonia, and COVID-19 vaccines. Betsy Coder, MD  09/27/2021  10:49 AM

## 2021-10-16 LAB — FLOW CYTOMETRY

## 2021-11-13 ENCOUNTER — Ambulatory Visit: Payer: Medicare Other | Admitting: Adult Health

## 2021-11-13 ENCOUNTER — Encounter: Payer: Self-pay | Admitting: Adult Health

## 2021-11-16 ENCOUNTER — Emergency Department (HOSPITAL_COMMUNITY): Payer: Medicare Other

## 2021-11-16 ENCOUNTER — Other Ambulatory Visit: Payer: Self-pay

## 2021-11-16 ENCOUNTER — Emergency Department (HOSPITAL_COMMUNITY)
Admission: EM | Admit: 2021-11-16 | Discharge: 2021-11-16 | Disposition: A | Discharge status: Home / Self Care | Payer: Medicare Other | Attending: Emergency Medicine | Admitting: Emergency Medicine

## 2021-11-16 DIAGNOSIS — F0390 Unspecified dementia without behavioral disturbance: Secondary | ICD-10-CM | POA: Diagnosis not present

## 2021-11-16 DIAGNOSIS — I10 Essential (primary) hypertension: Secondary | ICD-10-CM | POA: Insufficient documentation

## 2021-11-16 DIAGNOSIS — E860 Dehydration: Secondary | ICD-10-CM | POA: Diagnosis not present

## 2021-11-16 DIAGNOSIS — R404 Transient alteration of awareness: Secondary | ICD-10-CM | POA: Insufficient documentation

## 2021-11-16 DIAGNOSIS — R4182 Altered mental status, unspecified: Secondary | ICD-10-CM | POA: Diagnosis present

## 2021-11-16 LAB — LACTIC ACID, PLASMA
Lactic Acid, Venous: 2.7 mmol/L (ref 0.5–1.9)
Lactic Acid, Venous: 1.9 mmol/L (ref 0.5–1.9)

## 2021-11-16 LAB — CBC
HCT: 35.9 % — ABNORMAL LOW (ref 39.0–52.0)
Hemoglobin: 11 g/dL — ABNORMAL LOW (ref 13.0–17.0)
MCH: 31.1 pg (ref 26.0–34.0)
MCHC: 30.6 g/dL (ref 30.0–36.0)
MCV: 101.4 fL — ABNORMAL HIGH (ref 80.0–100.0)
Platelets: 103 10*3/uL — ABNORMAL LOW (ref 150–400)
RBC: 3.54 MIL/uL — ABNORMAL LOW (ref 4.22–5.81)
RDW: 16.6 % — ABNORMAL HIGH (ref 11.5–15.5)
WBC: 23 10*3/uL — ABNORMAL HIGH (ref 4.0–10.5)
nRBC: 0.9 % — ABNORMAL HIGH (ref 0.0–0.2)

## 2021-11-16 LAB — URINALYSIS, ROUTINE W REFLEX MICROSCOPIC
Bilirubin Urine: NEGATIVE
Glucose, UA: NEGATIVE mg/dL
Hgb urine dipstick: NEGATIVE
Ketones, ur: NEGATIVE mg/dL
Leukocytes,Ua: NEGATIVE
Nitrite: NEGATIVE
Protein, ur: 30 mg/dL — AB
Specific Gravity, Urine: 1.019 (ref 1.005–1.030)
pH: 5 (ref 5.0–8.0)

## 2021-11-16 LAB — CBG MONITORING, ED: Glucose-Capillary: 103 mg/dL — ABNORMAL HIGH (ref 70–99)

## 2021-11-16 LAB — TSH: TSH: 1.099 u[IU]/mL (ref 0.350–4.500)

## 2021-11-16 MED ORDER — SODIUM CHLORIDE 0.9 % IV BOLUS
1000.0000 mL | Freq: Once | INTRAVENOUS | Status: AC
  Administered 2021-11-16: 1000 mL via INTRAVENOUS

## 2021-11-16 MED ORDER — CEFTRIAXONE SODIUM 1 G IJ SOLR
1.0000 g | Freq: Once | INTRAMUSCULAR | Status: AC
Start: 2021-11-16 — End: 2021-11-16
  Administered 2021-11-16: 1 g via INTRAVENOUS
  Filled 2021-11-16: qty 10

## 2021-11-16 NOTE — ED Triage Notes (Signed)
Pt arrived via [facility] from adult daycare. Per EMS pt was found slumped in chair around 0915 9/29/. LKN at [place]. Diagnosed with UTI 10 days ago, on antibiotic. Baseline is "more talkative than he is right now". Hx of alzheimer .   84/50, 70HR, 96% RA, CBG 145

## 2021-11-16 NOTE — Discharge Instructions (Signed)
Jeffrey Huerta have been evaluated for his symptoms.  He appears to be dehydrated.  Please stay hydrated.  Urinalysis obtained today without obvious signs of urinary tract infection.  He may return back to his adult daycare facility.

## 2021-11-16 NOTE — ED Notes (Signed)
Family verbalizes understanding of discharge instructions. Opportunity for questioning and answers were provided. Armband removed by staff, pt discharged from ED. Pt taken to ED entrance via wheelchair and assisted into vehicle.

## 2021-11-16 NOTE — ED Provider Notes (Signed)
[facility] EMERGENCY DEPARTMENT Provider Note   CSN: [id number] Arrival date & time: 11/16/21  5400     History  No chief complaint on file.   Jeffrey Huerta is a 76 y.o. male.  The history is provided by the EMS personnel and medical records. No language interpreter was used.     76 year old male with significant history of dementia, hypertension, AV malformation, brain aneurysm, brought here via EMS with concerns of altered mental status.  Per EMS, patient obtained a adult daycare center.  When he arrived to the daycare center he was reportedly doing fine.  Sitting in chair couple hours later staff noticed that he was slumped over.  He did not fall down to the ground no injury to his head.  He was arousable.  When EMS arrived, they noted that his blood pressure was a bit soft at 88 systolic.  No IV fluids given.  Patient reportedly recently diagnosed with leukemia.  He was also diagnosed with urinary tract infection approximate 10 days prior and was on antibiotic.  At baseline patient is more conversant according to the staff.  No report of any fever, normal CBG initially.  History of obtained and was limited due to patient's baseline dementia.  Home Medications Prior to Admission medications   Medication Sig Start Date End Date Taking? Authorizing Provider  acetaminophen (TYLENOL) 500 MG tablet Take 500 mg by mouth daily as needed for mild pain (hand cramps).    [provider]  traZODone (DESYREL) 50 MG tablet Take 50 mg by mouth at bedtime. Patient not taking: Reported on 09/27/2021 09/10/21   [provider]      Allergies    Patient has no known allergies.    Review of Systems   Review of Systems  Unable to perform ROS: Mental status change    Physical Exam Updated Vital Signs BP 114/73   Pulse 69   Temp 97.8 F (36.6 C) (Oral)   Resp 19   Ht '5\' 3"'$  (1.6 m)   Wt 55.4 kg   SpO2 100%   BMI 21.64 kg/m  Physical Exam Vitals and nursing  note reviewed.  Constitutional:      General: He is not in acute distress.    Appearance: He is well-developed.     Comments: Elderly male laying in bed appears to be in no acute discomfort.  HENT:     Head: Atraumatic.     Mouth/Throat:     Comments: Mouth is full of saliva. Eyes:     Conjunctiva/sclera: Conjunctivae normal.  Neck:     Comments: No nuchal rigidity Cardiovascular:     Rate and Rhythm: Normal rate and regular rhythm.  Pulmonary:     Effort: Pulmonary effort is normal.     Breath sounds: Normal breath sounds.  Abdominal:     Palpations: Abdomen is soft.     Tenderness: There is no abdominal tenderness.  Musculoskeletal:     Cervical back: Normal range of motion and neck supple. No rigidity.     Right lower leg: No edema.     Left lower leg: No edema.  Skin:    Findings: No rash.  Neurological:     Mental Status: He is alert. He is disoriented.     GCS: GCS eye subscore is 4. GCS verbal subscore is 5. GCS motor subscore is 6.     Cranial Nerves: Cranial nerves 2-12 are intact.     Sensory: Sensation is intact.  Motor: Motor function is intact.     Comments: 5 out of 5 strength all 4 extremities with equal grip strength bilaterally.     ED Results / Procedures / Treatments   Labs (all labs ordered are listed, but only abnormal results are displayed) Labs Reviewed  CBC - Abnormal; Notable for the following components:      Result Value   WBC 23.0 (*)    RBC 3.54 (*)    Hemoglobin 11.0 (*)    HCT 35.9 (*)    MCV 101.4 (*)    RDW 16.6 (*)    Platelets 103 (*)    nRBC 0.9 (*)    All other components within normal limits  URINALYSIS, ROUTINE W REFLEX MICROSCOPIC - Abnormal; Notable for the following components:   APPearance HAZY (*)    Protein, ur 30 (*)    Bacteria, UA RARE (*)    All other components within normal limits  LACTIC ACID, PLASMA - Abnormal; Notable for the following components:   Lactic Acid, Venous 2.7 (*)    All other components  within normal limits  CBG MONITORING, ED - Abnormal; Notable for the following components:   Glucose-Capillary 103 (*)    All other components within normal limits  URINE CULTURE  TSH  LACTIC ACID, PLASMA  BASIC METABOLIC PANEL    EKG None  Date: 11/16/2021  Rate: 77  Rhythm: normal sinus rhythm  QRS Axis: normal  Intervals: normal  ST/T Wave abnormalities: normal  Conduction Disutrbances: none  Narrative Interpretation:   Old EKG Reviewed: No significant changes noted    Radiology CT HEAD WO CONTRAST  Result Date: 11/16/2021 CLINICAL DATA:  Altered mental status. EXAM: CT HEAD WITHOUT CONTRAST TECHNIQUE: Contiguous axial images were obtained from the base of the skull through the vertex without intravenous contrast. RADIATION DOSE REDUCTION: This exam was performed according to the departmental dose-optimization program which includes automated exposure control, adjustment of the mA and/or kV according to patient size and/or use of iterative reconstruction technique. COMPARISON:  August 29, 2021. FINDINGS: Brain: Distal tip of right ventriculostomy catheter is seen in the right lateral ventricle. Ventricular size is within normal limits. No mass effect or midline shift is noted. Left cerebellar encephalomalacia is again noted. Chronic ischemic white matter disease is noted. No mass effect or midline shift is noted. Ventricular size is within normal limits. There is no evidence of mass lesion, hemorrhage or acute infarction. Vascular: No hyperdense vessel or unexpected calcification. Skull: Right-sided ventriculostomy catheter is noted. No acute abnormality is seen involving the skull. Postoperative changes are seen in the right occipital region. Sinuses/Orbits: Mild left sphenoid and maxillary sinusitis is noted. Other: Fluid is noted in the right mastoid air cells. IMPRESSION: Stable chronic findings as described above. No definite acute abnormality is noted. Electronically Signed   By:  Marijo Conception M.D.   On: 11/16/2021 10:55   DG Chest 2 View  Result Date: 11/16/2021 CLINICAL DATA:  Altered mental status, chest pain, shortness of breath EXAM: CHEST - 2 VIEW COMPARISON:  08/29/2021 FINDINGS: Normal heart size, mediastinal contours, and pulmonary vascularity. Atherosclerotic calcification aorta. Lungs clear. No pulmonary infiltrate, pleural effusion, or pneumothorax. Osseous structures demineralized. VP shunt tubing traverses RIGHT hemithorax. IMPRESSION: No acute abnormalities. Aortic Atherosclerosis (ICD10-I70.0). Electronically Signed   By: Lavonia Dana M.D.   On: 11/16/2021 10:36    Procedures Procedures    Medications Ordered in ED Medications  cefTRIAXone (ROCEPHIN) 1 g in sodium chloride 0.9 %  100 mL IVPB (has no administration in time range)  sodium chloride 0.9 % bolus 1,000 mL (1,000 mLs Intravenous New Bag/Given 11/16/21 1131)    ED Course/ Medical Decision Making/ A&P                           Medical Decision Making Amount and/or Complexity of Data Reviewed Labs: ordered. Radiology: ordered.   BP 101/68 (BP Location: Right Arm)   Pulse 75   Temp 97.8 F (36.6 C) (Oral)   Resp 13   Ht '5\' 3"'$  (1.6 m)   Wt 55.4 kg   SpO2 96%   BMI 21.64 kg/m   10:20 AM This is a 76 year old male significant history of dementia, history of urinary tract infection diagnosed 10 days ago on antibiotic, recently diagnosed with leukemia, normally attending a adult daycare center who was brought here via EMS from the daycare center today when he was found slumped over on his chair.  Staff report patient was more so at his baseline and when he first arrived 2 hours ago.  They however noticed patient was slumping over of his chair but did not fall down to the ground.  He was arousable.  He was noted to have initial blood pressure of 88 systolic with normal heart rate.  Unsure if he is on any beta-blocker medication. Patient denies any headache, neck pain, chest pain, trouble  breathing, abdominal pain, back pain, dysuria, any new focal numbness or weakness.  Unsure patient's CODE STATUS  On exam this is an elderly male laying in bed appears to be in no acute discomfort.  He is able to answer his name but mouth is full of saliva.  He is oriented and alert x1.  He is able to move all 4 extremities and follows simple command.  He does not have any reproducible pain.  Work-up initiated.  Reviewed prior visit on EMR.  Patient has had history of syncope in the past last admitted in July of this year for same when he was found unresponsive while sitting in chair at his adult daycare.  We will get that time shows normal VP shunt and head CT scan within normal limit.  Patient does have bradycardia and echocardiogram did not show any valve or abnormalities.  EEG does not show any seizure.  2:41 PM Wife mentioned that patient was seen by his PCP yesterday and was diagnosed with UTI.  Prescription of antibiotic was prescribed but patient has not started on the medication yet.  Since patient has elevated lactic acid and elevated white count, but no fever no tachycardia or hypotension, I will provide IV fluid, and a dose of Rocephin was ordered.  Normal TSH, no significant anemia, normal CBG.  Head CT scan obtained independently viewed interpreted by me and agree with radiology interpretation.  CT scan did not show any acute abnormalities, chest x-ray unremarkable.  EKG without concerning arrhythmia or ischemic changes.  UA resulted and without obvious sign to suggest UTI  After discussion with patient's wife in regards to patient's previous hospitalization for similar presentation and he has had thorough work-up we felt patient can return home and she feels comfortable taking him home.  Return precaution given.  Patient may return back to daycare facility.  Suspect elevated lactic acid is likely secondary to dehydration and less likely to be infection.  2:58 PM On reassessment patient  appears more alert and conversant which is improved from prior.  I  have considered admission given patient's abnormal lab values but in the setting of a pretty thorough previous work-up for his symptoms similar to today and no obvious signs of UTI I felt patient is stable to be discharged.  Return precaution given.  This patient presents to the ED for concern of AMS, this involves an extensive number of treatment options, and is a complaint that carries with it a high risk of complications and morbidity.  The differential diagnosis includes UTI, delirium, near syncope, dehydration, dementia  Co morbidities that complicate the patient evaluation dementia Additional history obtained:  Additional history obtained from wife External records from outside source obtained and reviewed including EMR including labs and imaging  Lab Tests:  I Ordered, and personally interpreted labs.  The pertinent results include:  as above  Imaging Studies ordered:  I ordered imaging studies including CXR I independently visualized and interpreted imaging which showed no acute changes I agree with the radiologist interpretation  Cardiac Monitoring:  The patient was maintained on a cardiac monitor.  I personally viewed and interpreted the cardiac monitored which showed an underlying rhythm of: NSR  Medicines ordered and prescription drug management:  I ordered medication including IVF  for dehydration Reevaluation of the patient after these medicines showed that the patient improved I have reviewed the patients home medicines and have made adjustments as needed  Test Considered: as above  Critical Interventions: IV abx  IVF  Consultations Obtained:  I requested consultation with the attending Dr. Alvino Chapel,  and discussed lab and imaging findings as well as pertinent plan - they recommend: outpt f/u  Problem List / ED Course: near syncope  AMS  dehydration  Reevaluation:  After the  interventions noted above, I reevaluated the patient and found that they have :improved  Social Determinants of Health: none  Dispostion:  After consideration of the diagnostic results and the patients response to treatment, I feel that the patent would benefit from outpt f/u.         Final Clinical Impression(s) / ED Diagnoses Final diagnoses:  Dehydration  Transient alteration of awareness    Rx / DC Orders ED Discharge Orders     None         Domenic Moras, PA-C 11/16/21 1533    Davonna Belling, MD 11/17/21 [phone]

## 2021-11-17 LAB — URINE CULTURE

## 2021-11-19 ENCOUNTER — Ambulatory Visit: Payer: Medicare Other | Admitting: Adult Health

## 2021-11-19 ENCOUNTER — Encounter: Payer: Self-pay | Admitting: Adult Health

## 2021-11-19 VITALS — BP 138/80 | HR 68 | Ht 63.0 in | Wt 119.0 lb

## 2021-11-19 DIAGNOSIS — G309 Alzheimer's disease, unspecified: Secondary | ICD-10-CM | POA: Diagnosis not present

## 2021-11-19 DIAGNOSIS — F0280 Dementia in other diseases classified elsewhere without behavioral disturbance: Secondary | ICD-10-CM | POA: Diagnosis not present

## 2021-11-19 NOTE — Progress Notes (Signed)
PATIENT: Jeffrey Huerta DOB: [birth year]  REASON FOR VISIT: follow up HISTORY FROM: patient  Chief Complaint  Patient presents with   RM 19    Here with his wife for follow-up; she states things are not good. He still goes to [employer] He is incontinent more-so out of the home then at home and he wears pull-ups. 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 He hums a lot and sits with his head down and doesn't do much talking. A couple of times at the center he was unresponsive. The last time he went to the hospital he was given fluids. He was also treated for UTI around that time. Denies falls. Up at night alot      HISTORY OF PRESENT ILLNESS: Today 11/19/21:  Jeffrey Huerta is a 76 year old male with a history of Alzheimer's dementia.  He returns today for follow-up.  He continues to live at home with his wife.  Attends [employer] during the day.  Wife has noticed a steady decline.  He is less verbal.  More incontinence.  He is otherwise at night.  Has have assistance with all ADLs.  She is looking to get some help with hygiene needs.  Returns today for an evaluation.  06/04/21: Jeffrey Huerta is a 76 year old male with a history of alzhemiers dementia. Reprots today for follow up. Wife reports some decline. Reports that he hums or groans a lot. Reports that [facility] has told her that he wobbles some with ambulation. Does not use an assistive device typically. Reports that he is tired all the time. Sleeps during the day when at home. Attends [employer] during the week. Wife reports that he is engaged in the activities there. Needs some assistance with ADLs and prompting. Has cameras and locks on the doors incase he gets up at night. Not on any medication.   11/20/20:Jeffrey Huerta is a 76 y.o male with a history of Alzheimer's dementia. He returns today for a follow up. He is here today with his wife. She states that overall things are going well, he has had some progression  in symptoms with periodic agitation but continues to be familiar with friends and family. He is performing a majority of his ADL's on his own with minimal assistance. He does not operate a Teacher, music or cook. Wife endorses that he is not sleeping well, she sates that he wakes in the middle of the night and walks around the house. He is not wandering outside of the house at this time. He continues to eat well. Patient is still going to [employer] day program. He takes the [employer] bus in the morning which he thoroughly enjoys. Wife picks him up in the afternoon. He is not taking any medications at this time. Wife denies any falls.  HISTORY  11/18/19:  Jeffrey Huerta is a 76 year old male with a history of Alzheimer's dementia.  He returns today for follow-up.  He is here today with his wife.  She states overall things have been relatively stable.  Reports that he has good days and bad days in regards to his memory.  He requires assistance with all ADLs.  Denies any trouble sleeping.  He is able to feed himself.  He does not drive.  He does go to [facility] day program.  He is no longer taking any of his medication.  He returns today for an evaluation. 05/17/19: Jeffrey Huerta is a 76 year old male with a history  of Alzheimer's disease.  He returns today for follow-up.  He is currently not on any medication for his memory.  At the last visit his wife reported that she was having a hard time giving him medication.  She reports that he had Covid in January and was in the hospital for 1 week.  He is now doing better.  At home he requires assistance/supervision with most ADLs.  She manages all the finances.  He no longer operates a Teacher, music.  She reports that for the most part his mood is pleasant.  He often sings old hymns  to her.  Returns today for follow-upToday 11/18/19: Jeffrey Huerta is a 76 year old male with a history of Alzheimer's dementia.  He returns today for follow-up.  He is here today with his wife.  She  states overall things have been relatively stable.  Reports that he has good days and bad days in regards to his memory.  He requires assistance with all ADLs.  Denies any trouble sleeping.  He is able to feed himself.  He does not drive.  He does go to [facility] day program.  He is no longer taking any of his medication.  He returns today for an evaluation.   HISTORY 05/17/19: Jeffrey Huerta is a 76 year old male with a history of Alzheimer's disease.  He returns today for follow-up.  He is currently not on any medication for his memory.  At the last visit his wife reported that she was having a hard time giving him medication.  She reports that he had Covid in January and was in the hospital for 1 week.  He is now doing better.  At home he requires assistance/supervision with most ADLs.  She manages all the finances.  He no longer operates a Teacher, music.  She reports that for the most part his mood is pleasant.  He often sings old hymns  to her.  Returns today for follow-up  REVIEW OF SYSTEMS: Out of a complete 14 system review of symptoms, the patient complains only of the following symptoms, and all other reviewed systems are negative.  ALLERGIES: No Known Allergies  HOME MEDICATIONS: Outpatient Medications Prior to Visit  Medication Sig Dispense Refill   acetaminophen (TYLENOL) 500 MG tablet Take 500 mg by mouth daily as needed for mild pain (hand cramps).     ciprofloxacin (CIPRO) 500 MG tablet Take 1 tablet by mouth every 12 (twelve) hours.     traZODone (DESYREL) 50 MG tablet Take 50 mg by mouth at bedtime. (Patient not taking: Reported on 09/27/2021)     No facility-administered medications prior to visit.    PAST MEDICAL HISTORY: Past Medical History:  Diagnosis Date   Aneurysm ([place])    AVM (arteriovenous malformation)    Dementia ([place])    Hydrocephalus ([facility])    VP shunt in place   Hypertension     PAST SURGICAL HISTORY: Past Surgical History:  Procedure Laterality Date    VENTRICULOPERITONEAL SHUNT     prior to 2008    FAMILY HISTORY: Family History  Problem Relation Age of Onset   Stroke Mother    Hypertension Mother     SOCIAL HISTORY: Social History   Socioeconomic History   Marital status: Married    Spouse name: Not on file   Number of children: 3   Years of education: 10th   Highest education level: Not on file  Occupational History   Occupation: Retired  Tobacco Use   Smoking status: Never  Smokeless tobacco: Never  Vaping Use   Vaping Use: Never used  Substance and Sexual Activity   Alcohol use: No    Alcohol/week: 0.0 standard drinks of alcohol   Drug use: No   Sexual activity: Not on file  Other Topics Concern   Not on file  Social History Narrative   Lives at home with wife.   Right-handed.   No caffeine use.   Social Determinants of Health   Financial Resource Strain: Not on file  Food Insecurity: Not on file  Transportation Needs: Not on file  Physical Activity: Not on file  Stress: Not on file  Social Connections: Not on file  Intimate Partner Violence: Not on file    PHYSICAL EXAM  Vitals:   11/19/21 1504  BP: 138/80  Pulse: 68  Weight: 119 lb (54 kg)  Height: '5\' 3"'$  (1.6 m)   Body mass index is 21.08 kg/m.    11/18/2019    9:15 AM 05/17/2019    1:02 PM 03/13/2017    8:36 AM  MMSE - Mini Mental State Exam  Orientation to time '1 1 1  '$ Orientation to Place '3 2 4  '$ Registration 0 1 3  Attention/ Calculation 0 1 0  Recall 0 0 0  Language- name 2 objects 0 2 2  Language- repeat 0 1 1  Language- follow 3 step command '3 3 3  '$ Language- read & follow direction 0 1 1  Write a sentence 0 0 1  Copy design 0 0 1  Total score '7 12 17     '$ Generalized: Well developed, in no acute distress   Neurological examination  Mentation: Alert. Follows commands  Cranial nerve II-XII: Pupils were equal round reactive to light. Extraocular movements were full, visual field were full on confrontational test. Facial  sensation and strength were normal. Head turning and shoulder shrug  were normal and symmetric. Motor: The motor testing reveals 5 over 5 strength of all 4 extremities. Good symmetric motor tone is noted throughout.   Coordination: UTA    DIAGNOSTIC DATA (LABS, IMAGING, TESTING) - I reviewed patient records, labs, notes, testing and imaging myself where available.  Lab Results  Component Value Date   WBC 23.0 (H) 11/16/2021   HGB 11.0 (L) 11/16/2021   HCT 35.9 (L) 11/16/2021   MCV 101.4 (H) 11/16/2021   PLT 103 (L) 11/16/2021      Component Value Date/Time   NA 142 09/02/2021 0241   K 4.0 09/02/2021 0241   CL 111 09/02/2021 0241   CO2 24 09/02/2021 0241   GLUCOSE 82 09/02/2021 0241   BUN 17 09/02/2021 0241   CREATININE 0.99 09/02/2021 0241   CREATININE 1.19 03/09/2021 0000   CALCIUM 8.2 (L) 09/02/2021 0241   PROT 8.4 (H) 08/15/2021 0905   ALBUMIN 2.6 (L) 08/15/2021 0905   AST 17 08/15/2021 0905   ALT 12 08/15/2021 0905   ALKPHOS 53 08/15/2021 0905   BILITOT 0.1 (L) 08/15/2021 0905   GFRNONAA >60 09/02/2021 0241   GFRAA >60 03/10/2019 1822   Lab Results  Component Value Date   CHOL 156 03/09/2021   HDL 40 03/09/2021   LDLCALC 98 03/09/2021   TRIG 88 03/09/2021   CHOLHDL 3.9 03/09/2021   Lab Results  Component Value Date   HGBA1C 5.6 03/07/2019   Lab Results  Component Value Date   VITAMINB12 231 09/01/2021   Lab Results  Component Value Date   TSH 1.099 11/16/2021      ASSESSMENT  AND PLAN 76 y.o. year old male  has a past medical history of Aneurysm ([place]), AVM (arteriovenous malformation), Dementia ([place]), Hydrocephalus ([place]), and Hypertension. here with   1.  Alzheimer's dementia   No longer taking any medication Advised to continue monitoring symptoms Follow-up on an as needed basis.     Ward Givens, MSN, NP-C 11/19/2021, 3:17 PM [facility] [address] [place], [place] [address] [phone]

## 2021-11-20 ENCOUNTER — Telehealth: Payer: Self-pay | Admitting: Adult Health

## 2021-11-20 DIAGNOSIS — F0280 Dementia in other diseases classified elsewhere without behavioral disturbance: Secondary | ICD-10-CM

## 2021-11-20 NOTE — Telephone Encounter (Signed)
Order placed

## 2021-11-20 NOTE — Telephone Encounter (Signed)
Noted, sent [place] a message informing her that the order has been placed.

## 2021-11-20 NOTE — Telephone Encounter (Signed)
[place] sent me a message stating she can take the patient.   "However, he will need Pat/OT added to his orders as [facility] can not stand alone. Can you have md to add? "

## 2021-11-20 NOTE — Telephone Encounter (Signed)
Sent to [place] with [facility] to see if she will be able to take the patient.

## 2021-11-20 NOTE — Addendum Note (Signed)
Addended by: Trudie Buckler on: 11/20/2021 10:46 AM   Modules accepted: Orders

## 2021-11-21 NOTE — Telephone Encounter (Signed)
Please let patients wife know that we can't get approval for aide since he goes to [facility]. Tell her to check some of the resources I gave her

## 2021-11-21 NOTE — Telephone Encounter (Signed)
I called pts wife.  I relayed that since he was in [facility] he is not approved for an aide.  She stated she has been looking thru the packet of information. I stated that [facility] is a service for custodial care , is an OOP expense.  They do have grants that can apply for assistance.  I told her it does not hurt to call and speak to someone and see what they say, cost and may be give other alternatives. She appreciated callback.

## 2021-11-21 NOTE — Telephone Encounter (Signed)
[place] sent me a message stating:  "Note from PT [facility] A [facility] FROM 8 TO 4 AND MOBILITY IS GOOD, THEY ARE WANTING TO HAVE MORE OF AN AIDE TO HELP SHOWER. NOT APPROPRIATE FOR SKILLED CARE"   "Unfortunately, he goes to adult daycare therefore we cannot help. Also, sound like they are wanting an aide mostly."

## 2021-12-13 ENCOUNTER — Inpatient Hospital Stay: Payer: Medicare Other | Admitting: Oncology

## 2021-12-13 ENCOUNTER — Inpatient Hospital Stay: Payer: Medicare Other | Attending: Oncology

## 2021-12-13 VITALS — BP 119/68 | HR 66 | Temp 98.1°F | Resp 18 | Ht 63.0 in | Wt 120.0 lb

## 2021-12-13 DIAGNOSIS — D72829 Elevated white blood cell count, unspecified: Secondary | ICD-10-CM

## 2021-12-13 DIAGNOSIS — F0390 Unspecified dementia without behavioral disturbance: Secondary | ICD-10-CM | POA: Insufficient documentation

## 2021-12-13 DIAGNOSIS — C9110 Chronic lymphocytic leukemia of B-cell type not having achieved remission: Secondary | ICD-10-CM | POA: Diagnosis not present

## 2021-12-13 DIAGNOSIS — D649 Anemia, unspecified: Secondary | ICD-10-CM | POA: Diagnosis not present

## 2021-12-13 LAB — CBC WITH DIFFERENTIAL (CANCER CENTER ONLY)
Abs Immature Granulocytes: 0 10*3/uL (ref 0.00–0.07)
Basophils Absolute: 0 10*3/uL (ref 0.0–0.1)
Basophils Relative: 0 %
Eosinophils Absolute: 0 10*3/uL (ref 0.0–0.5)
Eosinophils Relative: 0 %
HCT: 32.3 % — ABNORMAL LOW (ref 39.0–52.0)
Hemoglobin: 9.7 g/dL — ABNORMAL LOW (ref 13.0–17.0)
Lymphocytes Relative: 93 %
Lymphs Abs: 20.5 10*3/uL — ABNORMAL HIGH (ref 0.7–4.0)
MCH: 30.5 pg (ref 26.0–34.0)
MCHC: 30 g/dL (ref 30.0–36.0)
MCV: 101.6 fL — ABNORMAL HIGH (ref 80.0–100.0)
Monocytes Absolute: 0 10*3/uL — ABNORMAL LOW (ref 0.1–1.0)
Monocytes Relative: 0 %
Neutro Abs: 1.5 10*3/uL — ABNORMAL LOW (ref 1.7–7.7)
Neutrophils Relative %: 7 %
Platelet Count: 112 10*3/uL — ABNORMAL LOW (ref 150–400)
RBC: 3.18 MIL/uL — ABNORMAL LOW (ref 4.22–5.81)
RDW: 16 % — ABNORMAL HIGH (ref 11.5–15.5)
WBC Count: 22 10*3/uL — ABNORMAL HIGH (ref 4.0–10.5)
nRBC: 0 % (ref 0.0–0.2)

## 2021-12-13 NOTE — Progress Notes (Signed)
  Jeffrey Huerta OFFICE PROGRESS NOTE   Diagnosis: CLL  INTERVAL HISTORY:   Jeffrey Huerta returns for a scheduled visit.  He is here with his wife.  She reports he continues to have balance difficulty.  He had another syncope event while in a store.  He goes to adult daycare.  Good appetite.  Persistent palpable lymph nodes. Jeffrey Huerta has no complaint.  Objective:  Vital signs in last 24 hours:  Blood pressure 119/68, pulse 66, temperature 98.1 F (36.7 C), temperature source Oral, resp. rate 18, height '5\' 3"'$  (1.6 m), weight 120 lb (54.4 kg), SpO2 99 %.    Lymphatics: Soft 1/2-1.5 cm bilateral low posterior cervical, inguinal, and femoral nodes.  1-2 cm bilateral axillary nodes Resp: Lungs clear bilaterally Cardio: Regular rate and rhythm GI: No hepatosplenomegaly Vascular: No leg edema Neuro: Alert, follows simple commands   Lab Results:  Lab Results  Component Value Date   WBC 22.0 (H) 12/13/2021   HGB 9.7 (L) 12/13/2021   HCT 32.3 (L) 12/13/2021   MCV 101.6 (H) 12/13/2021   PLT 112 (L) 12/13/2021   NEUTROABS PENDING 12/13/2021    CMP  Lab Results  Component Value Date   NA 142 09/02/2021   K 4.0 09/02/2021   CL 111 09/02/2021   CO2 24 09/02/2021   GLUCOSE 82 09/02/2021   BUN 17 09/02/2021   CREATININE 0.99 09/02/2021   CALCIUM 8.2 (L) 09/02/2021   PROT 8.4 (H) 08/15/2021   ALBUMIN 2.6 (L) 08/15/2021   AST 17 08/15/2021   ALT 12 08/15/2021   ALKPHOS 53 08/15/2021   BILITOT 0.1 (L) 08/15/2021   GFRNONAA >60 09/02/2021   GFRAA >60 03/10/2019    Medications: I have reviewed the patient's current medications.   Assessment/Plan: CLL Peripheral blood flow cytometry 09/01/2021-B-cell population coexpressing CD5 and CD200 with lambda restriction consistent with CLL CT chest 08/30/2021-bilateral axillary, hilar, mediastinal, and upper abdominal adenopathy Elevated immunoglobulins 09/01/2021 with no monoclonal protein identified 2.   Anemia/thrombocytopenia secondary to #1 3.  Dementia 4.  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  He appears asymptomatic from CLL.  He has mild anemia.  The hemoglobin has not changed significantly over the past several months.  He does not appear symptomatic from anemia.  His chief medical problem is severe dementia.  He also has balance difficulty.  His wife will be sure he is up-to-date on influenza, COVID-19, and pneumonia vaccines.  He will return for an office visit in 3 months.  She will contact us if he develops new symptoms.  Betsy Coder, MD  12/13/2021  10:30 AM

## 2022-01-17 ENCOUNTER — Encounter (HOSPITAL_COMMUNITY): Payer: Self-pay | Admitting: Emergency Medicine

## 2022-01-17 ENCOUNTER — Emergency Department (HOSPITAL_COMMUNITY): Payer: Medicare Other

## 2022-01-17 ENCOUNTER — Emergency Department (HOSPITAL_COMMUNITY)
Admission: EM | Admit: 2022-01-17 | Discharge: 2022-01-17 | Disposition: A | Discharge status: Home / Self Care | Payer: Medicare Other | Attending: Emergency Medicine | Admitting: Emergency Medicine

## 2022-01-17 DIAGNOSIS — I10 Essential (primary) hypertension: Secondary | ICD-10-CM | POA: Insufficient documentation

## 2022-01-17 DIAGNOSIS — R404 Transient alteration of awareness: Secondary | ICD-10-CM | POA: Diagnosis not present

## 2022-01-17 DIAGNOSIS — R4182 Altered mental status, unspecified: Secondary | ICD-10-CM | POA: Diagnosis present

## 2022-01-17 DIAGNOSIS — F0390 Unspecified dementia without behavioral disturbance: Secondary | ICD-10-CM | POA: Insufficient documentation

## 2022-01-17 DIAGNOSIS — Z856 Personal history of leukemia: Secondary | ICD-10-CM | POA: Insufficient documentation

## 2022-01-17 LAB — COMPREHENSIVE METABOLIC PANEL
ALT: 13 U/L (ref 0–44)
AST: 13 U/L — ABNORMAL LOW (ref 15–41)
Albumin: 2.2 g/dL — ABNORMAL LOW (ref 3.5–5.0)
Alkaline Phosphatase: 43 U/L (ref 38–126)
Anion gap: 7 (ref 5–15)
BUN: 16 mg/dL (ref 8–23)
CO2: 26 mmol/L (ref 22–32)
Calcium: 8.3 mg/dL — ABNORMAL LOW (ref 8.9–10.3)
Chloride: 107 mmol/L (ref 98–111)
Creatinine, Ser: 0.95 mg/dL (ref 0.61–1.24)
GFR, Estimated: >60 mL/min (ref >60)
Glucose, Bld: 117 mg/dL — ABNORMAL HIGH (ref 70–99)
Potassium: 5.1 mmol/L (ref 3.5–5.1)
Sodium: 140 mmol/L (ref 135–145)
Total Bilirubin: 0.4 mg/dL (ref 0.3–1.2)
Total Protein: 8 g/dL (ref 6.5–8.1)

## 2022-01-17 LAB — CBC WITH DIFFERENTIAL/PLATELET
Abs Immature Granulocytes: 0.03 10*3/uL (ref 0.00–0.07)
Basophils Absolute: 0 10*3/uL (ref 0.0–0.1)
Basophils Relative: 0 %
Eosinophils Absolute: 0 10*3/uL (ref 0.0–0.5)
Eosinophils Relative: 0 %
HCT: 28.4 % — ABNORMAL LOW (ref 39.0–52.0)
Hemoglobin: 8.3 g/dL — ABNORMAL LOW (ref 13.0–17.0)
Immature Granulocytes: 0 %
Lymphocytes Relative: 78 %
Lymphs Abs: 14.2 10*3/uL — ABNORMAL HIGH (ref 0.7–4.0)
MCH: 30.1 pg (ref 26.0–34.0)
MCHC: 29.2 g/dL — ABNORMAL LOW (ref 30.0–36.0)
MCV: 102.9 fL — ABNORMAL HIGH (ref 80.0–100.0)
Monocytes Absolute: 1.6 10*3/uL — ABNORMAL HIGH (ref 0.1–1.0)
Monocytes Relative: 9 %
Neutro Abs: 2.4 10*3/uL (ref 1.7–7.7)
Neutrophils Relative %: 13 %
Platelets: 124 10*3/uL — ABNORMAL LOW (ref 150–400)
RBC: 2.76 MIL/uL — ABNORMAL LOW (ref 4.22–5.81)
RDW: 15.6 % — ABNORMAL HIGH (ref 11.5–15.5)
WBC: 18.3 10*3/uL — ABNORMAL HIGH (ref 4.0–10.5)
nRBC: 0.4 % — ABNORMAL HIGH (ref 0.0–0.2)

## 2022-01-17 LAB — URINALYSIS, ROUTINE W REFLEX MICROSCOPIC
Bilirubin Urine: NEGATIVE
Glucose, UA: NEGATIVE mg/dL
Hgb urine dipstick: NEGATIVE
Ketones, ur: NEGATIVE mg/dL
Leukocytes,Ua: NEGATIVE
Nitrite: NEGATIVE
Protein, ur: NEGATIVE mg/dL
Specific Gravity, Urine: 1.023 (ref 1.005–1.030)
pH: 5 (ref 5.0–8.0)

## 2022-01-17 NOTE — ED Triage Notes (Signed)
Pt arrives via EMS from home where wife reports pt ate dinner, went to bed and then she checked on him and he is not talking. Pt has dementia. Pt has had the episodes in the past.

## 2022-01-17 NOTE — ED Provider Notes (Signed)
[facility] EMERGENCY DEPARTMENT Provider Note   CSN: [id number] Arrival date & time: 01/17/22  1850     History  Chief Complaint  Patient presents with   Altered Mental Status    Jeffrey Huerta is a 76 y.o. male.   Altered Mental Status The patient is a 76 year old male with past medical history of leukemia (not on treatment), dementia, HTN, AV malformation, brain aneurysm, and multiple emergency department visits for altered mental status presents for altered mental status presenting by EMS for altered mental status.  Per the patient's wife who is at bedside, the patient had an episode earlier this evening where he was staring off in space not answering questions.  She states that the episode lasted for approximately 20 minutes before self resolving.  The patient's daughter, who is also at bedside, states that he occasionally has these episodes and that they both feel that the patient is back to his baseline.    Home Medications Prior to Admission medications   Medication Sig Start Date End Date Taking? Authorizing Provider  acetaminophen (TYLENOL) 500 MG tablet Take 500 mg by mouth daily as needed for mild pain (hand cramps).    [provider]      Allergies    Patient has no known allergies.    Review of Systems   Review of Systems  See HPI  Physical Exam Updated Vital Signs BP 121/87 (BP Location: Right Arm)   Pulse 69   Temp 98.1 F (36.7 C) (Oral)   Resp 16   Ht '5\' 6"'$  (1.676 m)   Wt 53.3 kg   SpO2 100%   BMI 18.97 kg/m  Physical Exam Vitals and nursing note reviewed.  Constitutional:      General: He is not in acute distress.    Appearance: He is well-developed.     Comments: The patient is following basic commands but not answering questions which is consistent with his baseline per wife and daughter who are at bedside.  HENT:     Head: Normocephalic and atraumatic.  Eyes:     Conjunctiva/sclera: Conjunctivae normal.   Cardiovascular:     Rate and Rhythm: Normal rate and regular rhythm.     Heart sounds: No murmur heard. Pulmonary:     Effort: Pulmonary effort is normal. No respiratory distress.     Breath sounds: Normal breath sounds.  Abdominal:     Palpations: Abdomen is soft.     Tenderness: There is no abdominal tenderness.  Musculoskeletal:        General: No swelling.     Cervical back: Neck supple.  Skin:    General: Skin is warm and dry.     Capillary Refill: Capillary refill takes less than 2 seconds.  Neurological:     Mental Status: Mental status is at baseline.  Psychiatric:        Mood and Affect: Mood normal.     ED Results / Procedures / Treatments   Labs (all labs ordered are listed, but only abnormal results are displayed) Labs Reviewed  CBC WITH DIFFERENTIAL/PLATELET - Abnormal; Notable for the following components:      Result Value   WBC 18.3 (*)    RBC 2.76 (*)    Hemoglobin 8.3 (*)    HCT 28.4 (*)    MCV 102.9 (*)    MCHC 29.2 (*)    RDW 15.6 (*)    Platelets 124 (*)    nRBC 0.4 (*)    Lymphs  Abs 14.2 (*)    Monocytes Absolute 1.6 (*)    All other components within normal limits  COMPREHENSIVE METABOLIC PANEL - Abnormal; Notable for the following components:   Glucose, Bld 117 (*)    Calcium 8.3 (*)    Albumin 2.2 (*)    AST 13 (*)    All other components within normal limits  URINALYSIS, ROUTINE W REFLEX MICROSCOPIC - Abnormal; Notable for the following components:   Bacteria, UA RARE (*)    All other components within normal limits  I-STAT VENOUS BLOOD GAS, ED    EKG EKG Interpretation  Date/Time:  Thursday January 17 2022 18:53:26 EST Ventricular Rate:  78 PR Interval:  150 QRS Duration: 88 QT Interval:  402 QTC Calculation: 458 R Axis:   13 Text Interpretation: Normal sinus rhythm Confirmed by Lennice Sites ([id number]) on 01/17/2022 7:29:10 PM  Radiology CT Head Wo Contrast  Result Date: 01/17/2022 CLINICAL DATA:  Altered mental status  EXAM: CT HEAD WITHOUT CONTRAST TECHNIQUE: Contiguous axial images were obtained from the base of the skull through the vertex without intravenous contrast. RADIATION DOSE REDUCTION: This exam was performed according to the departmental dose-optimization program which includes automated exposure control, adjustment of the mA and/or kV according to patient size and/or use of iterative reconstruction technique. COMPARISON:  11/16/2021 FINDINGS: Brain: Chronic atrophic and ischemic changes are noted. No acute hemorrhage is identified. No infarct is seen. Right-sided ventriculostomy catheter is noted. Left cerebellar infarct is stable. Vascular: No hyperdense vessel or unexpected calcification. Skull: Postsurgical changes are noted in the occipital bone stable from the prior exam. Sinuses/Orbits: No acute finding. Other: Bilateral mastoid effusions are seen. IMPRESSION: New mastoid effusions of uncertain significance. Chronic changes intracranially without acute abnormality. Electronically Signed   By: Inez Catalina M.D.   On: 01/17/2022 21:33   DG Chest 1 View  Result Date: 01/17/2022 CLINICAL DATA:  Altered mental status. EXAM: CHEST  1 VIEW COMPARISON:  November 16, 2021 FINDINGS: The heart size and mediastinal contours are within normal limits. There is mild calcification of the aortic arch. Intact ventriculoperitoneal shunt tubing is noted. Both lungs are clear. The visualized skeletal structures are unremarkable. IMPRESSION: No active disease. Electronically Signed   By: Virgina Norfolk M.D.   On: 01/17/2022 20:03    Procedures Procedures    Medications Ordered in ED Medications - No data to display  ED Course/ Medical Decision Making/ A&P                           Medical Decision Making  The patient is a 76 year old male with past medical history of leukemia (not on treatment), dementia, HTN, AV malformation, brain aneurysm, and multiple emergency department visits for altered mental status  presents for altered mental status presenting by EMS for altered mental status.  Differential diagnosis considered includes: CVA, TIA, hypoglycemia, medication side effect, infection, electrolyte abnormality, shunt malfunction, metabolic abnormality.  On initial presentation, the patient was hemodynamically stable and afebrile.  He was at his mental status baseline per the patient's wife and daughter who are at bedside.  The patient was able to follow basic commands on physical exam and shook his head "no" when asked about pain.  The patient's diagnostic work-up included a CBC with white blood cell count of 18.3 (improved from baseline) hemoglobin of 8.3; CMP without significant abnormality; urinalysis with rare bacteria but negative for nitrites, leukocytes, urine WBCs.  The patient received a chest x-ray  which showed no signs of focal consolidation to suggest pneumonia or other cardiopulmonary abnormality.  His VP shunt tubing was noted to be well-positioned.  The patient received a CT head which showed new mastoid effusions without acute intercranial abnormality.  There was no point tenderness to palpation correlated on physical exam.  He also received an EKG which showed normal sinus rhythm without ischemic changes.  Based on the patient's history of symptoms, physical exam, and diagnostic work-up which appears consistent with the patient's established baseline, the patient's episode is favored to be consistent with his recurrent transient episodes of unresponsiveness.  I doubt CVA, electrolyte abnormality, infection, or hypoglycemia based on the patient's work-up and physical exam.  The patient was discharged with instructions to follow-up with his PCP for reevaluation in 24-48 hours and given strict return precautions to the emergency department.  The patient's wife was at bedside throughout the entire encounter and reiterated prior to discharge that she felt like he was at his baseline.  Amount  and/or Complexity of Data Reviewed Independent Historian: caregiver and spouse Labs: ordered. Decision-making details documented in ED Course. Radiology: ordered and independent interpretation performed. Decision-making details documented in ED Course. ECG/medicine tests: ordered and independent interpretation performed. Decision-making details documented in ED Course.   Patient's presentation is most consistent with acute complicated illness / injury requiring diagnostic workup.         Final Clinical Impression(s) / ED Diagnoses Final diagnoses:  Transient alteration of awareness    Rx / DC Orders ED Discharge Orders     None         Dani Gobble, MD 01/17/22 [place], [place], DO 01/17/22 2340

## 2022-01-17 NOTE — ED Provider Notes (Signed)
Supervised resident visit.  Patient with history of dementia, hypertension.  Seems he had an episode where he was nonresponsive.  This is similar to prior.  He is back to his baseline upon arrival here.  He has no complaints.  Vital signs are normal.  There is been no falls.  No fever.  Broad workup was initiated to evaluate for infectious process or electrolyte abnormality.  Had a head CT done 2.  Urinalysis per my review was negative for infection.  He has a mild leukocytosis but otherwise lab works unremarkable.  CT scan of the head is unremarkable.  Chest x-ray negative for infection.  EKG per my review and interpretation is sinus rhythm.  No ischemic changes.  Overall reassurance given.  Discharged in good condition.   This chart was dictated using voice recognition software.  Despite best efforts to proofread,  errors can occur which can change the documentation meaning.    Lennice Sites, DO 01/17/22 2155

## 2022-01-22 ENCOUNTER — Telehealth: Payer: Self-pay | Admitting: Adult Health

## 2022-01-22 ENCOUNTER — Telehealth: Payer: Self-pay

## 2022-01-22 NOTE — Telephone Encounter (Signed)
Pt wife is calling. Stated she needs to talk to nurse. Stated Pt is changing for the worse. She did not go into details she was crying and upset. Looked on current DPR and wife name isn't on it.

## 2022-01-22 NOTE — Telephone Encounter (Signed)
Pt wife called states the patient is changing for the worse. I called and the pt wife explain to me he is more incontinence and not responding to her. The pt does not have any s/s no fever, no cough, no shortness of breath. He is more incontinence and he is do not recognize her or his name. I explained to his wife he have dementia and memories can comes and goes with dementia. If could just be his dementia.

## 2022-01-23 NOTE — Telephone Encounter (Signed)
I called wife of pt, Jeffrey Huerta, she was with pt last in.  Pt was seen in hospital 01-17-2022 for unresponsiveness.  Was given IVF for pt per wife.  She said he is wanting to sleep all the time, not get up.  She stated that he has chronic leukemia, elevated WBC.  I relayed that MM/NP was out today.  I recommended that since was seen in ED recently.  I would follow up with pcp and then let us know.  We have to see pt as needed.  Appt would not be acute, recommended again to see pcp initially, or urgent care.  She appreciated call back.  DPR needs to be updated to include Jeffrey Huerta , wife , ok to speak to.

## 2022-01-28 ENCOUNTER — Other Ambulatory Visit: Payer: Self-pay

## 2022-01-31 ENCOUNTER — Telehealth: Payer: Self-pay

## 2022-01-31 NOTE — Telephone Encounter (Signed)
(  3:46 pm) PC SW received a call from patient's wife-Jewell. She report "he ain't doing nothing". She report that patient has bee in bed for the past three days, not eating, and he has lost a significant amount of weight. Patient is no longer going to [employer]. She feels it maybe time for hospice. SW explained the transition process to hospice which first required an order from his doctor. SW advised that she will contact patient's doctor to request the order. [name] endorsed this plan and she is aware that the [facility] hospice referral center will contact her to schedule a consult visit.   (3:54 pm) PC SW telephoned Dr. Santiago Bur office and spoke with Bobbye Riggs. An order for a hospice consult was requested.  Bobbye Riggs advised she will forward to the doctor for follow-up.

## 2022-03-01 ENCOUNTER — Emergency Department (HOSPITAL_COMMUNITY)
Admission: EM | Admit: 2022-03-01 | Discharge: 2022-03-02 | Disposition: A | Discharge status: Home / Self Care | Attending: Emergency Medicine | Admitting: Emergency Medicine

## 2022-03-01 ENCOUNTER — Emergency Department (HOSPITAL_COMMUNITY)

## 2022-03-01 ENCOUNTER — Other Ambulatory Visit: Payer: Self-pay

## 2022-03-01 ENCOUNTER — Encounter (HOSPITAL_COMMUNITY): Payer: Self-pay

## 2022-03-01 DIAGNOSIS — E860 Dehydration: Secondary | ICD-10-CM | POA: Insufficient documentation

## 2022-03-01 DIAGNOSIS — R4182 Altered mental status, unspecified: Secondary | ICD-10-CM | POA: Diagnosis not present

## 2022-03-01 DIAGNOSIS — F0390 Unspecified dementia without behavioral disturbance: Secondary | ICD-10-CM | POA: Diagnosis not present

## 2022-03-01 LAB — URINALYSIS, ROUTINE W REFLEX MICROSCOPIC
Bilirubin Urine: NEGATIVE
Glucose, UA: NEGATIVE mg/dL
Ketones, ur: NEGATIVE mg/dL
Leukocytes,Ua: NEGATIVE
Nitrite: NEGATIVE
Protein, ur: 30 mg/dL — AB
RBC / HPF: >50 RBC/hpf — ABNORMAL HIGH (ref 0–5)
Specific Gravity, Urine: 1.027 (ref 1.005–1.030)
pH: 5 (ref 5.0–8.0)

## 2022-03-01 LAB — BASIC METABOLIC PANEL
Anion gap: 8 (ref 5–15)
BUN: 11 mg/dL (ref 8–23)
CO2: 23 mmol/L (ref 22–32)
Calcium: 7.9 mg/dL — ABNORMAL LOW (ref 8.9–10.3)
Chloride: 106 mmol/L (ref 98–111)
Creatinine, Ser: 0.79 mg/dL (ref 0.61–1.24)
GFR, Estimated: >60 mL/min (ref >60)
Glucose, Bld: 75 mg/dL (ref 70–99)
Potassium: 4.6 mmol/L (ref 3.5–5.1)
Sodium: 137 mmol/L (ref 135–145)

## 2022-03-01 MED ORDER — LACTATED RINGERS IV BOLUS
2000.0000 mL | Freq: Once | INTRAVENOUS | Status: AC
  Administered 2022-03-01: 2000 mL via INTRAVENOUS

## 2022-03-01 NOTE — ED Notes (Signed)
Patient transported to CT 

## 2022-03-01 NOTE — ED Triage Notes (Addendum)
Reports by EMS that patient was sitting in wheelchair and he went unresponsive so wife lowered him to griund and started chest compressions.  EMS reports he has had a pulse and was breathing the hold time.  Patient cbg >500 but no hx of DM.  Patient is nonverbal at baseline. Initially HR 40s but has returned to baseline without any meds. Patient has multiple visits for transient alteration in mental statuys.

## 2022-03-01 NOTE — ED Provider Notes (Signed)
[facility] EMERGENCY DEPARTMENT Provider Note   CSN: [id number] Arrival date & time: 03/01/22  1800     History Chief Complaint  Patient presents with   Altered Mental Status    HPI Jeffrey Huerta is a 77 y.o. male presenting for episode of altered mentation.  Per his wife, he was unresponsive in his wheelchair.  He has been seen for similar complaints in the past with similar descriptions.  She pulled him onto the ground and started chest compressions.  By the time EMS arrived 5 minutes later approximately, he had a pulse.  No resuscitative efforts were needed.  Patient hemodynamically stable no acute distress.  He is nonverbal at baseline per EMS.  Back to mental status baseline at this time per wife's assessment and EMS.   Patient's recorded medical, surgical, social, medication list and allergies were reviewed in the Snapshot window as part of the initial history.   Review of Systems   Review of Systems  Unable to perform ROS: Patient nonverbal    Physical Exam Updated Vital Signs BP 121/75   Pulse (!) 54   Temp (!) 97.3 F (36.3 C) (Rectal)   Resp 17   Ht '5\' 6"'$  (1.676 m)   Wt 53.1 kg   SpO2 100%   BMI 18.88 kg/m  Physical Exam Vitals and nursing note reviewed.  Constitutional:      General: He is not in acute distress.    Appearance: He is well-developed.  HENT:     Head: Normocephalic and atraumatic.  Eyes:     Conjunctiva/sclera: Conjunctivae normal.  Cardiovascular:     Rate and Rhythm: Normal rate and regular rhythm.     Heart sounds: No murmur heard. Pulmonary:     Effort: Pulmonary effort is normal. No respiratory distress.     Breath sounds: Normal breath sounds.  Abdominal:     Palpations: Abdomen is soft.     Tenderness: There is no abdominal tenderness.  Musculoskeletal:        General: No swelling.     Cervical back: Neck supple.  Skin:    General: Skin is warm and dry.     Capillary Refill: Capillary refill takes less than 2  seconds.  Neurological:     Mental Status: He is alert.     Comments: Nonverbal.  Psychiatric:        Mood and Affect: Mood normal.      ED Course/ Medical Decision Making/ A&P Clinical Course as of 03/01/22 2321  Fri Mar 01, 2022  2234 Had a prolonged conversation with patient's wife at this time. We discussed his high morbidity and mortality given his progressive dehydration, now new intolerance of any p.o. intake. Patient's wife knows that his Alzheimer's is progressing. She confirmed that he would not want to undergo any painful interventions at time of death and would not want to undergo CPR or intubation.  We had a further conversation and she stated that she has placed him on hospice and would like to focus on his comfort. After this conversation.  We will stop attempting to get blood work tonight and will rehydrate the patient with IV fluids prior to discharge. Patient's wife will plan to call the hospice company in the morning to let them know of these changes and get further recommendations from the hospice agency regarding his comfort. Given his complete intolerance of p.o. intake at this time, I expect short residual lifespan Though I informed the family that predicting this  is ultimately impossible. Patient was made DNR comfort based on this conversation, patient's wishes, patient's wife's recommendation. [CC]  2235 Notably, patient goes in and out of V. tach while in the emergency department.  I favor that this is likely the etiology of his syncopal events.  I informed the spouse of this and that it will likely cause cardiac demise in the indeterminate future. [CC]    Clinical Course User Index [CC] Tretha Sciara, MD    Procedures .Critical Care  Performed by: Tretha Sciara, MD Authorized by: Tretha Sciara, MD   Critical care provider statement:    Critical care time (minutes):  30   Critical care was necessary to treat or prevent imminent or  life-threatening deterioration of the following conditions:  CNS failure or compromise (I was called emergently to bedside for evaluation of an unresponsive patient who had received bystander CPR.)   Critical care was time spent personally by me on the following activities:  Development of treatment plan with patient or surrogate, discussions with consultants, evaluation of patient's response to treatment, examination of patient, ordering and review of laboratory studies, ordering and review of radiographic studies, ordering and performing treatments and interventions, pulse oximetry, re-evaluation of patient's condition and review of old charts    Medical Decision Making:    Jeffrey Huerta is a 77 y.o. male who presented to the ED today with episode of altered mental status detailed above.     Handoff received from EMS.  Additional history discussed with patient's family/caregivers.  Patient's presentation is complicated by their history of advanced dementia.  Patient placed on continuous vitals and telemetry monitoring while in ED which was reviewed periodically.   Complete initial physical exam performed, notably the patient  was hemodynamically stable in no acute distress.  Nonverbal.      Reviewed and confirmed nursing documentation for past medical history, family history, social history.    Initial Assessment:   Patient's history of present illness physicals and findings are most consistent with syncopal events of uncertain etiology.  Likely cardiac due to multiple episodes of ventricular tachycardia appreciated on telemetry. May also be volume related with orthostasis due to his complete intolerance to p.o. intake in the outpatient setting. Had a prolonged shared medical decision making conversation with patient's wife at bedside.  All of this was documented in the ED course.  In short, he has multiple critical likely end-of-life conditions many of which do not have clear treatment plans that  patient would be a candidate for. Family expresses understanding and after providing CPR earlier themselves, they state they would not want him to go through that again and would want him to die peacefully if his time comes. They would like to make the patient DNR comfort and having taken home.  They will plan to update the hospice in the area.  Disposition:  I have considered need for hospitalization, however, considering all of the above, I believe this patient is stable for discharge at this time.  Patient/family educated about specific return precautions for given chief complaint and symptoms.  Patient/family educated about follow-up with PCP.     Patient/family expressed understanding of return precautions and need for follow-up. Patient spoken to regarding all imaging and laboratory results and appropriate follow up for these results. All education provided in verbal form with additional information in written form. Time was allowed for answering of patient questions. Patient discharged.    Emergency Department Medication Summary:   Medications  lactated ringers bolus 2,000  mL (2,000 mLs Intravenous New Bag/Given 03/01/22 2302)    Clinical Impression:  1. Dehydration      Discharge   Final Clinical Impression(s) / ED Diagnoses Final diagnoses:  Dehydration    Rx / DC Orders ED Discharge Orders     None         Tretha Sciara, MD 03/01/22 2321

## 2022-03-02 NOTE — ED Notes (Signed)
Called [facility] to transport patient back to home address

## 2022-03-02 NOTE — ED Notes (Signed)
Cancelled [facility] due to family taking patient home

## 2022-03-02 NOTE — ED Notes (Signed)
Pt family transporting pt home. Pt dressed and assisted into wheel chair. Discharge instructions reviewed.

## 2022-03-14 ENCOUNTER — Inpatient Hospital Stay: Admitting: Oncology

## 2022-03-14 ENCOUNTER — Inpatient Hospital Stay: Attending: Oncology

## 2022-03-14 VITALS — BP 102/66 | HR 62 | Temp 98.1°F | Resp 18 | Ht 66.0 in | Wt 98.0 lb

## 2022-03-14 DIAGNOSIS — D72829 Elevated white blood cell count, unspecified: Secondary | ICD-10-CM

## 2022-03-14 DIAGNOSIS — C9110 Chronic lymphocytic leukemia of B-cell type not having achieved remission: Secondary | ICD-10-CM | POA: Diagnosis present

## 2022-03-14 DIAGNOSIS — F0390 Unspecified dementia without behavioral disturbance: Secondary | ICD-10-CM | POA: Insufficient documentation

## 2022-03-14 LAB — CBC WITH DIFFERENTIAL (CANCER CENTER ONLY)
Abs Immature Granulocytes: 0.03 10*3/uL (ref 0.00–0.07)
Basophils Absolute: 0 10*3/uL (ref 0.0–0.1)
Basophils Relative: 0 %
Eosinophils Absolute: 0 10*3/uL (ref 0.0–0.5)
Eosinophils Relative: 0 %
HCT: 35.2 % — ABNORMAL LOW (ref 39.0–52.0)
Hemoglobin: 10.4 g/dL — ABNORMAL LOW (ref 13.0–17.0)
Immature Granulocytes: 0 %
Lymphocytes Relative: 90 %
Lymphs Abs: 14.9 10*3/uL — ABNORMAL HIGH (ref 0.7–4.0)
MCH: 29.2 pg (ref 26.0–34.0)
MCHC: 29.5 g/dL — ABNORMAL LOW (ref 30.0–36.0)
MCV: 98.9 fL (ref 80.0–100.0)
Monocytes Absolute: 0.2 10*3/uL (ref 0.1–1.0)
Monocytes Relative: 1 %
Neutro Abs: 1.5 10*3/uL — ABNORMAL LOW (ref 1.7–7.7)
Neutrophils Relative %: 9 %
Platelet Count: 145 10*3/uL — ABNORMAL LOW (ref 150–400)
RBC: 3.56 MIL/uL — ABNORMAL LOW (ref 4.22–5.81)
RDW: 16.6 % — ABNORMAL HIGH (ref 11.5–15.5)
Smear Review: NORMAL
WBC Count: 16.6 10*3/uL — ABNORMAL HIGH (ref 4.0–10.5)
nRBC: 0 % (ref 0.0–0.2)

## 2022-03-14 NOTE — Progress Notes (Signed)
  Jeffrey Huerta OFFICE PROGRESS NOTE   Diagnosis: CLL  INTERVAL HISTORY:   Jeffrey Huerta returns for a scheduled visit.  He is here today with his wife.  She reports his mental status and activity level have declined significantly over the past few months.  He is now enrolled in home hospice care.  Feeding himself.  No fever or night sweats.  Objective:  Vital signs in last 24 hours:  Blood pressure 102/66, pulse 62, temperature 98.1 F (36.7 C), temperature source Oral, resp. rate 18, height '5\' 6"'$  (1.676 m), weight 98 lb (44.5 kg), SpO2 98 %.    Lymphatics: Soft mobile bilateral 1 cm cervical, axillary, and inguinal nodes Resp: Distant breath sounds Cardio: Regular rate and rhythm GI: No hepatosplenomegaly Neuro: Alert, not following commands, nonverbal    Portacath/PICC-without erythema  Lab Results:  Lab Results  Component Value Date   WBC 16.6 (H) 03/14/2022   HGB 10.4 (L) 03/14/2022   HCT 35.2 (L) 03/14/2022   MCV 98.9 03/14/2022   PLT 145 (L) 03/14/2022   NEUTROABS PENDING 03/14/2022    CMP  Lab Results  Component Value Date   NA 137 03/01/2022   K 4.6 03/01/2022   CL 106 03/01/2022   CO2 23 03/01/2022   GLUCOSE 75 03/01/2022   BUN 11 03/01/2022   CREATININE 0.79 03/01/2022   CALCIUM 7.9 (L) 03/01/2022   PROT 8.0 01/17/2022   ALBUMIN 2.2 (L) 01/17/2022   AST 13 (L) 01/17/2022   ALT 13 01/17/2022   ALKPHOS 43 01/17/2022   BILITOT 0.4 01/17/2022   GFRNONAA >60 03/01/2022   GFRAA >60 03/10/2019    Medications: I have reviewed the patient's current medications.   Assessment/Plan: CLL Peripheral blood flow cytometry 09/01/2021-B-cell population coexpressing CD5 and CD200 with lambda restriction consistent with CLL CT chest 08/30/2021-bilateral axillary, hilar, mediastinal, and upper abdominal adenopathy Elevated immunoglobulins 09/01/2021 with no monoclonal protein identified 2.  Anemia/thrombocytopenia secondary to #1 3.  Dementia 4.  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  He appears asymptomatic and he is stable from a hematologic standpoint.  He has severe dementia and has enrolled in home hospice care.  It is unlikely the CLL is contributing to his clinical decline.  I discussed the situation with his wife, and daughter by telephone.  They agree to follow-up with Dr. Jeanie Cooks the home hospice team.  He will be discharged from the oncology clinic.  I am available to see him as needed.  Betsy Coder, MD  03/14/2022  10:11 AM

## 2022-04-19 DEATH — deceased

## 2022-04-25 ENCOUNTER — Telehealth: Payer: Self-pay | Admitting: Oncology

## 2022-04-25 NOTE — Telephone Encounter (Signed)
Patient wife called in to advise that patient passed on 04/21/22.

## 2022-12-02 NOTE — Telephone Encounter (Signed)
Error
